# Patient Record
Sex: Male | Born: 1965 | Race: White | Hispanic: No | Marital: Single | Smoking: Never smoker
Health system: Southern US, Community
[De-identification: ages and names within clinical notes are randomized; demographics above are authoritative.]

## PROBLEM LIST (undated history)

## (undated) DIAGNOSIS — G4733 Obstructive sleep apnea (adult) (pediatric): Secondary | ICD-10-CM

## (undated) DIAGNOSIS — I219 Acute myocardial infarction, unspecified: Secondary | ICD-10-CM

## (undated) DIAGNOSIS — K8020 Calculus of gallbladder without cholecystitis without obstruction: Secondary | ICD-10-CM

## (undated) DIAGNOSIS — Z7982 Long term (current) use of aspirin: Secondary | ICD-10-CM

## (undated) DIAGNOSIS — E785 Hyperlipidemia, unspecified: Secondary | ICD-10-CM

## (undated) DIAGNOSIS — I2510 Atherosclerotic heart disease of native coronary artery without angina pectoris: Secondary | ICD-10-CM

## (undated) DIAGNOSIS — I509 Heart failure, unspecified: Secondary | ICD-10-CM

## (undated) DIAGNOSIS — E1169 Type 2 diabetes mellitus with other specified complication: Secondary | ICD-10-CM

## (undated) DIAGNOSIS — K219 Gastro-esophageal reflux disease without esophagitis: Secondary | ICD-10-CM

## (undated) DIAGNOSIS — R809 Proteinuria, unspecified: Secondary | ICD-10-CM

## (undated) DIAGNOSIS — I10 Essential (primary) hypertension: Secondary | ICD-10-CM

## (undated) DIAGNOSIS — N400 Enlarged prostate without lower urinary tract symptoms: Secondary | ICD-10-CM

## (undated) DIAGNOSIS — M1990 Unspecified osteoarthritis, unspecified site: Secondary | ICD-10-CM

## (undated) DIAGNOSIS — C801 Malignant (primary) neoplasm, unspecified: Secondary | ICD-10-CM

## (undated) DIAGNOSIS — G4730 Sleep apnea, unspecified: Secondary | ICD-10-CM

## (undated) DIAGNOSIS — E119 Type 2 diabetes mellitus without complications: Secondary | ICD-10-CM

## (undated) DIAGNOSIS — G5793 Unspecified mononeuropathy of bilateral lower limbs: Secondary | ICD-10-CM

## (undated) DIAGNOSIS — G709 Myoneural disorder, unspecified: Secondary | ICD-10-CM

## (undated) DIAGNOSIS — E871 Hypo-osmolality and hyponatremia: Secondary | ICD-10-CM

## (undated) HISTORY — PX: TRANSURETHRAL MICROWAVE THERAPY: SUR1394

## (undated) HISTORY — PX: APPENDECTOMY: SHX54

## (undated) HISTORY — DX: Heart failure, unspecified: I50.9

---
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
## 2005-05-21 DIAGNOSIS — C679 Malignant neoplasm of bladder, unspecified: Secondary | ICD-10-CM

## 2005-05-21 HISTORY — DX: Malignant neoplasm of bladder, unspecified: C67.9

## 2006-01-22 ENCOUNTER — Other Ambulatory Visit: Payer: Self-pay

## 2006-01-28 ENCOUNTER — Ambulatory Visit: Payer: Self-pay | Admitting: Urology

## 2010-09-06 ENCOUNTER — Encounter: Payer: Self-pay | Admitting: Orthopedic Surgery

## 2010-09-19 ENCOUNTER — Encounter: Payer: Self-pay | Admitting: Orthopedic Surgery

## 2016-12-24 ENCOUNTER — Other Ambulatory Visit: Payer: Self-pay | Admitting: Internal Medicine

## 2016-12-24 DIAGNOSIS — I208 Other forms of angina pectoris: Secondary | ICD-10-CM

## 2016-12-24 DIAGNOSIS — I10 Essential (primary) hypertension: Secondary | ICD-10-CM | POA: Diagnosis present

## 2016-12-27 ENCOUNTER — Inpatient Hospital Stay
Admission: EM | Admit: 2016-12-27 | Discharge: 2016-12-29 | DRG: 247 | Disposition: A | Discharge status: Home / Self Care | Payer: BC Managed Care – PPO | Attending: Internal Medicine | Admitting: Internal Medicine

## 2016-12-27 ENCOUNTER — Emergency Department: Payer: BC Managed Care – PPO

## 2016-12-27 ENCOUNTER — Encounter: Payer: Self-pay | Admitting: Emergency Medicine

## 2016-12-27 DIAGNOSIS — Z6841 Body Mass Index (BMI) 40.0 and over, adult: Secondary | ICD-10-CM

## 2016-12-27 DIAGNOSIS — I214 Non-ST elevation (NSTEMI) myocardial infarction: Principal | ICD-10-CM

## 2016-12-27 DIAGNOSIS — E119 Type 2 diabetes mellitus without complications: Secondary | ICD-10-CM | POA: Diagnosis present

## 2016-12-27 DIAGNOSIS — E785 Hyperlipidemia, unspecified: Secondary | ICD-10-CM | POA: Diagnosis present

## 2016-12-27 DIAGNOSIS — E669 Obesity, unspecified: Secondary | ICD-10-CM | POA: Diagnosis present

## 2016-12-27 DIAGNOSIS — Z880 Allergy status to penicillin: Secondary | ICD-10-CM | POA: Diagnosis not present

## 2016-12-27 DIAGNOSIS — Z8551 Personal history of malignant neoplasm of bladder: Secondary | ICD-10-CM | POA: Diagnosis not present

## 2016-12-27 DIAGNOSIS — R809 Proteinuria, unspecified: Secondary | ICD-10-CM | POA: Diagnosis present

## 2016-12-27 HISTORY — DX: Proteinuria, unspecified: R80.9

## 2016-12-27 HISTORY — DX: Hyperlipidemia, unspecified: E78.5

## 2016-12-27 HISTORY — DX: Malignant (primary) neoplasm, unspecified: C80.1

## 2016-12-27 HISTORY — DX: Non-ST elevation (NSTEMI) myocardial infarction: I21.4

## 2016-12-27 HISTORY — DX: Type 2 diabetes mellitus without complications: E11.9

## 2016-12-27 LAB — PROTIME-INR
INR: 0.97
Prothrombin Time: 12.9 seconds (ref 11.4–15.2)

## 2016-12-27 LAB — COMPREHENSIVE METABOLIC PANEL
ALK PHOS: 116 U/L (ref 38–126)
ALT: 45 U/L (ref 17–63)
AST: 42 U/L — AB (ref 15–41)
Albumin: 3.7 g/dL (ref 3.5–5.0)
Anion gap: 9 (ref 5–15)
BUN: 18 mg/dL (ref 6–20)
CHLORIDE: 102 mmol/L (ref 101–111)
CO2: 23 mmol/L (ref 22–32)
CREATININE: 1.08 mg/dL (ref 0.61–1.24)
Calcium: 9 mg/dL (ref 8.9–10.3)
GFR calc Af Amer: >60 mL/min (ref >60)
Glucose, Bld: 268 mg/dL — ABNORMAL HIGH (ref 65–99)
Potassium: 4.1 mmol/L (ref 3.5–5.1)
SODIUM: 134 mmol/L — AB (ref 135–145)
Total Bilirubin: 0.5 mg/dL (ref 0.3–1.2)
Total Protein: 6.4 g/dL — ABNORMAL LOW (ref 6.5–8.1)

## 2016-12-27 LAB — GLUCOSE, CAPILLARY
GLUCOSE-CAPILLARY: 112 mg/dL — AB (ref 65–99)
Glucose-Capillary: 220 mg/dL — ABNORMAL HIGH (ref 65–99)
Glucose-Capillary: 256 mg/dL — ABNORMAL HIGH (ref 65–99)

## 2016-12-27 LAB — TROPONIN I
TROPONIN I: 0.21 ng/mL — AB (ref <0.03)
Troponin I: 0.21 ng/mL (ref <0.03)
Troponin I: 0.13 ng/mL (ref <0.03)
Troponin I: 0.21 ng/mL (ref <0.03)

## 2016-12-27 LAB — CBC WITH DIFFERENTIAL/PLATELET
BASOS ABS: 0.1 10*3/uL (ref 0–0.1)
Basophils Relative: 1 %
EOS PCT: 2 %
Eosinophils Absolute: 0.1 10*3/uL (ref 0–0.7)
HCT: 44.2 % (ref 40.0–52.0)
HEMOGLOBIN: 15.3 g/dL (ref 13.0–18.0)
LYMPHS PCT: 19 %
Lymphs Abs: 1.2 10*3/uL (ref 1.0–3.6)
MCH: 29.8 pg (ref 26.0–34.0)
MCHC: 34.7 g/dL (ref 32.0–36.0)
MCV: 86.1 fL (ref 80.0–100.0)
Monocytes Absolute: 0.3 10*3/uL (ref 0.2–1.0)
Monocytes Relative: 5 %
NEUTROS PCT: 73 %
Neutro Abs: 4.5 10*3/uL (ref 1.4–6.5)
PLATELETS: 189 10*3/uL (ref 150–440)
RBC: 5.14 MIL/uL (ref 4.40–5.90)
RDW: 15.4 % — ABNORMAL HIGH (ref 11.5–14.5)
WBC: 6.2 10*3/uL (ref 3.8–10.6)

## 2016-12-27 LAB — LIPID PANEL
Cholesterol: 140 mg/dL (ref 0–200)
HDL: 26 mg/dL — AB (ref >40)
LDL CALC: UNDETERMINED mg/dL (ref 0–99)
Total CHOL/HDL Ratio: 5.4 RATIO
Triglycerides: 527 mg/dL — ABNORMAL HIGH (ref <150)
VLDL: UNDETERMINED mg/dL (ref 0–40)

## 2016-12-27 LAB — BRAIN NATRIURETIC PEPTIDE: B NATRIURETIC PEPTIDE 5: 15 pg/mL (ref 0.0–100.0)

## 2016-12-27 LAB — HEPARIN LEVEL (UNFRACTIONATED): Heparin Unfractionated: <0.1 IU/mL — ABNORMAL LOW (ref 0.30–0.70)

## 2016-12-27 LAB — APTT: APTT: 24 s (ref 24–36)

## 2016-12-27 LAB — HEMOGLOBIN A1C
HEMOGLOBIN A1C: 8.7 % — AB (ref 4.8–5.6)
MEAN PLASMA GLUCOSE: 202.99 mg/dL

## 2016-12-27 MED ORDER — DOCUSATE SODIUM 100 MG PO CAPS: 100.0000 mg | ORAL_CAPSULE | Freq: Two times a day (BID) | ORAL | Status: DC | PRN

## 2016-12-27 MED ORDER — SODIUM CHLORIDE 0.9 % WEIGHT BASED INFUSION: 1.0000 mL/kg/h | INTRAVENOUS | Status: DC

## 2016-12-27 MED ORDER — SODIUM CHLORIDE 0.9 % IV SOLN: 250.0000 mL | INTRAVENOUS | Status: DC | PRN

## 2016-12-27 MED ORDER — ASPIRIN 81 MG PO CHEW
81.0000 mg | CHEWABLE_TABLET | ORAL | Status: AC
  Administered 2016-12-28: 81 mg via ORAL
  Filled 2016-12-27: qty 1

## 2016-12-27 MED ORDER — HEPARIN BOLUS VIA INFUSION
3150.0000 [IU] | Freq: Once | INTRAVENOUS | Status: AC
  Administered 2016-12-27: 3150 [IU] via INTRAVENOUS
  Filled 2016-12-27: qty 3150

## 2016-12-27 MED ORDER — GABAPENTIN 400 MG PO CAPS
400.0000 mg | ORAL_CAPSULE | Freq: Three times a day (TID) | ORAL | Status: DC
  Administered 2016-12-27 – 2016-12-29 (×6): 400 mg via ORAL
  Filled 2016-12-27 (×6): qty 1

## 2016-12-27 MED ORDER — ALUM & MAG HYDROXIDE-SIMETH 200-200-20 MG/5ML PO SUSP
30.0000 mL | Freq: Four times a day (QID) | ORAL | Status: DC | PRN
  Administered 2016-12-27 – 2016-12-28 (×3): 30 mL via ORAL
  Filled 2016-12-27 (×2): qty 30

## 2016-12-27 MED ORDER — ASPIRIN 81 MG PO CHEW
324.0000 mg | CHEWABLE_TABLET | Freq: Once | ORAL | Status: AC
  Administered 2016-12-27: 324 mg via ORAL
  Filled 2016-12-27: qty 4

## 2016-12-27 MED ORDER — SODIUM CHLORIDE 0.9% FLUSH: 3.0000 mL | INTRAVENOUS | Status: DC | PRN

## 2016-12-27 MED ORDER — IOPAMIDOL (ISOVUE-370) INJECTION 76%
75.0000 mL | Freq: Once | INTRAVENOUS | Status: AC | PRN
  Administered 2016-12-27: 100 mL via INTRAVENOUS

## 2016-12-27 MED ORDER — NITROGLYCERIN 0.4 MG SL SUBL: 0.4000 mg | SUBLINGUAL_TABLET | SUBLINGUAL | Status: DC | PRN

## 2016-12-27 MED ORDER — LOSARTAN POTASSIUM 50 MG PO TABS
100.0000 mg | ORAL_TABLET | Freq: Every day | ORAL | Status: DC
  Administered 2016-12-27 – 2016-12-29 (×2): 100 mg via ORAL
  Filled 2016-12-27 (×2): qty 2

## 2016-12-27 MED ORDER — ATORVASTATIN CALCIUM 20 MG PO TABS
20.0000 mg | ORAL_TABLET | Freq: Every day | ORAL | Status: DC
  Administered 2016-12-27: 20 mg via ORAL
  Filled 2016-12-27: qty 1

## 2016-12-27 MED ORDER — SODIUM CHLORIDE 0.9 % WEIGHT BASED INFUSION
3.0000 mL/kg/h | INTRAVENOUS | Status: AC
  Administered 2016-12-28: 3 mL/kg/h via INTRAVENOUS

## 2016-12-27 MED ORDER — INSULIN ASPART 100 UNIT/ML ~~LOC~~ SOLN
0.0000 [IU] | Freq: Three times a day (TID) | SUBCUTANEOUS | Status: DC
  Administered 2016-12-27: 3 [IU] via SUBCUTANEOUS
  Filled 2016-12-27: qty 1

## 2016-12-27 MED ORDER — HEPARIN (PORCINE) IN NACL 100-0.45 UNIT/ML-% IJ SOLN
2100.0000 [IU]/h | INTRAMUSCULAR | Status: DC
  Administered 2016-12-27: 1350 [IU]/h via INTRAVENOUS
  Administered 2016-12-27: 1700 [IU]/h via INTRAVENOUS
  Filled 2016-12-27: qty 250

## 2016-12-27 MED ORDER — ATORVASTATIN CALCIUM 20 MG PO TABS
40.0000 mg | ORAL_TABLET | Freq: Every day | ORAL | Status: DC
  Administered 2016-12-29: 40 mg via ORAL
  Filled 2016-12-27: qty 2

## 2016-12-27 MED ORDER — SODIUM CHLORIDE 0.9 % IV BOLUS (SEPSIS)
500.0000 mL | Freq: Once | INTRAVENOUS | Status: AC
Start: 2016-12-27 — End: 2016-12-27
  Administered 2016-12-27: 500 mL via INTRAVENOUS

## 2016-12-27 MED ORDER — SODIUM CHLORIDE 0.9% FLUSH
3.0000 mL | Freq: Two times a day (BID) | INTRAVENOUS | Status: DC
  Administered 2016-12-27 (×2): 3 mL via INTRAVENOUS

## 2016-12-27 MED ORDER — HEPARIN BOLUS VIA INFUSION
4000.0000 [IU] | Freq: Once | INTRAVENOUS | Status: AC
  Administered 2016-12-27: 4000 [IU] via INTRAVENOUS
  Filled 2016-12-27: qty 4000

## 2016-12-27 NOTE — H&P (Signed)
[place]  CARDIOLOGY CONSULT NOTE  Patient ID: Justin Hammond MRN: [id number] DOB/AGE: [birth year] 53 y.o.  Admit date: 12/27/2016 Referring Physician Dr. Anselm Jungling Primary Physician Dr. Hall Busing Primary Cardiologist Dr. Nehemiah Massed Reason for Consultation nstemi  HPI: Patient is a 51 year old male with no prior cardiac history with history of diabetes, hyperlipidemia, bladder carcinoma, obesity who has noticed progressive tightness in his chest with shortness of breath with exertion. He has dyspnea with minimal activity including tying his shoes. He presented to an outpatient cardiac clinic where he was evaluated and scheduled for nuclear stress test. He developed worsening chest pain and presented to the emergency room. Initial evaluation revealed a normal electrocardiogram. His initial serum troponin was 0.21. Subsequent values were similar at 0.13 and 0.21. His risk factors for coronary disease include diabetes, obesity and hyperlipidemia. He is currently pain-free and on IV heparin. Review of Systems  Constitutional: Negative.   HENT: Negative.   Eyes: Negative.   Respiratory: Positive for shortness of breath.   Cardiovascular: Positive for chest pain.  Gastrointestinal: Negative.   Genitourinary: Negative.   Musculoskeletal: Negative.   Skin: Negative.   Neurological: Negative.   Endo/Heme/Allergies: Negative.   Psychiatric/Behavioral: Negative.     Past Medical History:  Diagnosis Date  . Cancer [facility])    bladder cancer  . Diabetes mellitus without complication ([place])   . Hyperlipidemia   . Proteinuria     Family History  Problem Relation Age of Onset  . Breast cancer Mother   . CAD Father     Social History   Social History  . Marital status: Single    Spouse name: N/A  . Number of children: N/A  . Years of education: N/A   Occupational History  . Not on file.   Social History Main Topics  . Smoking status:  Never Smoker  . Smokeless tobacco: Never Used  . Alcohol use No  . Drug use: Unknown  . Sexual activity: Not on file   Other Topics Concern  . Not on file   Social History Narrative  . No narrative on file    Past Surgical History:  Procedure Laterality Date  . APPENDECTOMY       Prescriptions Prior to Admission  Medication Sig Dispense Refill Last Dose  . atorvastatin (LIPITOR) 20 MG tablet Take 1 tablet by mouth daily.   12/27/2016 at 0500  . gabapentin (NEURONTIN) 400 MG capsule Take 1 capsule by mouth 3 (three) times daily.   12/27/2016 at 0500  . glimepiride (AMARYL) 4 MG tablet Take 1 tablet by mouth daily.   12/27/2016 at 0500  . losartan (COZAAR) 100 MG tablet Take 1 tablet by mouth daily.   12/27/2016 at 0500  . metFORMIN (GLUCOPHAGE) 500 MG tablet Take 500 mg by mouth 2 (two) times daily.   12/27/2016 at 0500    Physical Exam: Blood pressure 132/80, pulse 70, temperature 98.9 F (37.2 C), temperature source Oral, resp. rate 17, height 5\' 8"  (1.727 m), weight (!) 151.4 kg (333 lb 11.2 oz), SpO2 97 %.   Wt Readings from Last 1 Encounters:  12/27/16 (!) 151.4 kg (333 lb 11.2 oz)     General appearance: alert and cooperative Resp: clear to auscultation bilaterally Chest wall: no tenderness Cardio: regular rate and rhythm GI: soft, non-tender; bowel sounds normal; no masses,  no organomegaly Extremities: extremities normal, atraumatic, no cyanosis or edema Neurologic: Grossly normal  Labs:   Lab Results  Component Value Date   WBC 6.2 12/27/2016   HGB 15.3 12/27/2016   HCT 44.2 12/27/2016   MCV 86.1 12/27/2016   PLT 189 12/27/2016    Recent Labs Lab 12/27/16 0707  NA 134*  K 4.1  CL 102  CO2 23  BUN 18  CREATININE 1.08  CALCIUM 9.0  PROT 6.4*  BILITOT 0.5  ALKPHOS 116  ALT 45  AST 42*  GLUCOSE 268*   Lab Results  Component Value Date   TROPONINI 0.21 ([place]) 12/27/2016      Radiology: Chest x-ray revealed no acute cardiopulmonary disease. Chest CT for  pulmonary embolus was normal showing no pulmonary embolus. Coronary calcifications were noted. EKG: Sinus rhythm with no ischemia  ASSESSMENT AND PLAN:  51 year old male with history of exertional chest pain with shortness of breath. He has developed progressive symptoms. Risk factors include diabetes, obesity. He has a mild serum troponin elevation concerning for possible non-ST elevation myocardial infarction. Renal function is normal. He had a chest CT today revealing no pulmonary embolus. He is on metformin for diabetes and took it this morning. We'll hold his metformin following his renal function and plan to proceed with left heart catheter in the morning on IV heparin to evaluate for evidence of significant coronary disease to guide further therapy. Signed: Teodoro Spray MD, [facility] 12/27/2016, 4:54 PM

## 2016-12-27 NOTE — ED Notes (Signed)
Patient transported to X-ray 

## 2016-12-27 NOTE — Progress Notes (Signed)
ANTICOAGULATION CONSULT NOTE - Initial Consult  Pharmacy Consult for Heparin Drip Indication: chest pain/ACS  Allergies  Allergen Reactions  . Penicillins Itching    Has patient had a PCN reaction causing immediate rash, facial/tongue/throat swelling, SOB or lightheadedness with hypotension: No Has patient had a PCN reaction causing severe rash involving mucus membranes or skin necrosis: No Has patient had a PCN reaction that required hospitalization: No Has patient had a PCN reaction occurring within the last 10 years: No If all of the above answers are "NO", then may proceed with Cephalosporin use.     Patient Measurements: Height: 5\' 8"  (172.7 cm) Weight: (!) 336 lb (152.4 kg) IBW/kg (Calculated) : 68.4 Heparin Dosing Weight: 105.6 kg  Vital Signs: Temp: 98.9 F (37.2 C) (08/09 1052) Temp Source: Oral (08/09 1052) BP: 132/80 (08/09 1052) Pulse Rate: 70 (08/09 1052)  Labs:  Recent Labs  12/27/16 0707 12/27/16 0922 12/27/16 0953  HGB 15.3  --   --   HCT 44.2  --   --   PLT 189  --   --   APTT  --  24  --   LABPROT  --  12.9  --   INR  --  0.97  --   CREATININE 1.08  --   --   TROPONINI 0.21*  --  0.13*    Estimated Creatinine Clearance: 114.1 mL/min (by C-G formula based on SCr of 1.08 mg/dL).   Medical History: Past Medical History:  Diagnosis Date  . Cancer [facility])    bladder cancer  . Diabetes mellitus without complication ([place])   . Hyperlipidemia   . Proteinuria     Medications:  Scheduled:  . atorvastatin  20 mg Oral Daily  . gabapentin  400 mg Oral TID  . insulin aspart  0-9 Units Subcutaneous TID WC  . losartan  100 mg Oral Daily   Infusions:  . heparin 1,350 Units/hr (12/27/16 0932)    Assessment: Heparin drip ordered for NSTEMI for this 51 yo male.   Goal of Therapy:  Heparin level 0.3-0.7 units/ml Monitor platelets by anticoagulation protocol: Yes   Plan:  Give 4000 units bolus x 1 Start heparin infusion at 1350 units/hr Check  anti-Xa level in 6 hours and daily while on heparin Continue to monitor H&H and platelets    HL ordered for today at 15:30.    Olivia Canter, [facility] Clinical Pharmacist 12/27/2016,11:19 AM

## 2016-12-27 NOTE — H&P (Signed)
[place] [facility] [place] NAME: Justin Hammond    MR#:  [id number]  DATE OF BIRTH:  July 18, 1965  DATE OF ADMISSION:  12/27/2016  PRIMARY CARE PHYSICIAN: Albina Billet, MD   REQUESTING/REFERRING PHYSICIAN: Clearnce Hasten  CHIEF COMPLAINT:   Chief Complaint  Patient presents with  . Shortness of Breath    HISTORY OF PRESENT ILLNESS: Justin Hammond  is a 51 y.o. male with a known history of Bladder cancer, diabetes, hyperlipidemia, proteinuria, obesity. For last few days he started having tightness in his chest and shortness of breath associated with exertion and minimal activities like walking a few steps or tying his shoes and concerned with this he went to his primary care's office they referred him to cardiologist. Patient saw Dr. Nehemiah Massed in office 2 days ago, EKG was fine so he was scheduled to have a nuclear stress test as outpatient next week. Patient continued to have pain and tightness with minimal exertion to the point today morning that he decided not to go to his work and instructed to call his primary care doctor's office. So he was advised from doctor's office to go to emergency room, and he came here. In ER his troponin is noted 0.2 and EKG was normal but because of his typical complaint he started on IV heparin drip for non-ST elevation MI and given to hospitalist team for further management.  PAST MEDICAL HISTORY:   Past Medical History:  Diagnosis Date  . Cancer [facility])    bladder cancer  . Diabetes mellitus without complication ([place])   . Hyperlipidemia   . Proteinuria     PAST SURGICAL HISTORY: Past Surgical History:  Procedure Laterality Date  . APPENDECTOMY      SOCIAL HISTORY:  Social History  Substance Use Topics  . Smoking status: Never Smoker  . Smokeless tobacco: Never Used  . Alcohol use No    FAMILY HISTORY:  Family History  Problem Relation Age of Onset  . Breast cancer Mother   . CAD Father     DRUG  ALLERGIES:  Allergies  Allergen Reactions  . Penicillins Itching    Has patient had a PCN reaction causing immediate rash, facial/tongue/throat swelling, SOB or lightheadedness with hypotension: No Has patient had a PCN reaction causing severe rash involving mucus membranes or skin necrosis: No Has patient had a PCN reaction that required hospitalization: No Has patient had a PCN reaction occurring within the last 10 years: No If all of the above answers are "NO", then may proceed with Cephalosporin use.     REVIEW OF SYSTEMS:   CONSTITUTIONAL: No fever, fatigue or weakness.  EYES: No blurred or double vision.  EARS, NOSE, AND THROAT: No tinnitus or ear pain.  RESPIRATORY: No cough, shortness of breath, wheezing or hemoptysis.  CARDIOVASCULAR: positive for chest pain,no orthopnea, edema.  GASTROINTESTINAL: No nausea, vomiting, diarrhea or abdominal pain.  GENITOURINARY: No dysuria, hematuria.  ENDOCRINE: No polyuria, nocturia,  HEMATOLOGY: No anemia, easy bruising or bleeding SKIN: No rash or lesion. MUSCULOSKELETAL: No joint pain or arthritis.   NEUROLOGIC: No tingling, numbness, weakness.  PSYCHIATRY: No anxiety or depression.   MEDICATIONS AT HOME:  Prior to Admission medications   Medication Sig Start Date End Date Taking? Authorizing Provider  atorvastatin (LIPITOR) 20 MG tablet Take 1 tablet by mouth daily. 09/12/15  Yes [provider]  gabapentin (NEURONTIN) 400 MG capsule Take 1 capsule by mouth 3 (three) times daily. 10/21/16  Yes [provider]  glimepiride (AMARYL) 4 MG tablet Take 1 tablet by mouth daily. 10/12/15  Yes [provider]  losartan (COZAAR) 100 MG tablet Take 1 tablet by mouth daily. 12/05/16  Yes [provider]  metFORMIN (GLUCOPHAGE) 500 MG tablet Take 500 mg by mouth 2 (two) times daily. 08/19/15  Yes [provider]      PHYSICAL EXAMINATION:   VITAL SIGNS: Blood pressure 125/70, pulse 74, resp. rate 16,  height 5\' 8"  (1.727 m), weight (!) 152.4 kg (336 lb), SpO2 96 %.  GENERAL:  51 y.o.-year-old obese patient lying in the bed with no acute distress.  EYES: Pupils equal, round, reactive to light and accommodation. No scleral icterus. Extraocular muscles intact.  HEENT: Head atraumatic, normocephalic. Oropharynx and nasopharynx clear.  NECK:  Supple, no jugular venous distention. No thyroid enlargement, no tenderness.  LUNGS: Normal breath sounds bilaterally, no wheezing, rales,rhonchi or crepitation. No use of accessory muscles of respiration.  CARDIOVASCULAR: S1, S2 normal. No murmurs, rubs, or gallops.  ABDOMEN: Soft, nontender, nondistended. Bowel sounds present. No organomegaly or mass.  EXTREMITIES: No pedal edema, cyanosis, or clubbing.  NEUROLOGIC: Cranial nerves II through XII are intact. Muscle strength 5/5 in all extremities. Sensation intact. Gait not checked.  PSYCHIATRIC: The patient is alert and oriented x 3.  SKIN: No obvious rash, lesion, or ulcer.   LABORATORY PANEL:   CBC  Recent Labs Lab 12/27/16 0707  WBC 6.2  HGB 15.3  HCT 44.2  PLT 189  MCV 86.1  MCH 29.8  MCHC 34.7  RDW 15.4*  LYMPHSABS 1.2  MONOABS 0.3  EOSABS 0.1  BASOSABS 0.1   ------------------------------------------------------------------------------------------------------------------  Chemistries   Recent Labs Lab 12/27/16 0707  NA 134*  K 4.1  CL 102  CO2 23  GLUCOSE 268*  BUN 18  CREATININE 1.08  CALCIUM 9.0  AST 42*  ALT 45  ALKPHOS 116  BILITOT 0.5   ------------------------------------------------------------------------------------------------------------------ estimated creatinine clearance is 114.1 mL/min (by C-G formula based on SCr of 1.08 mg/dL). ------------------------------------------------------------------------------------------------------------------ No results for input(s): TSH, T4TOTAL, T3FREE, THYROIDAB in the last 72 hours.  Invalid input(s):  FREET3   Coagulation profile No results for input(s): INR, PROTIME in the last 168 hours. ------------------------------------------------------------------------------------------------------------------- No results for input(s): DDIMER in the last 72 hours. -------------------------------------------------------------------------------------------------------------------  Cardiac Enzymes  Recent Labs Lab 12/27/16 0707  TROPONINI 0.21*   ------------------------------------------------------------------------------------------------------------------ Invalid input(s): POCBNP  ---------------------------------------------------------------------------------------------------------------  Urinalysis No results found for: COLORURINE, APPEARANCEUR, LABSPEC, PHURINE, GLUCOSEU, HGBUR, BILIRUBINUR, KETONESUR, PROTEINUR, UROBILINOGEN, NITRITE, LEUKOCYTESUR   RADIOLOGY: Dg Chest 2 View  Result Date: 12/27/2016 CLINICAL DATA:  51 year old male with history of acute onset of shortness of breath. Clinical concern for pneumonia. EXAM: CHEST  2 VIEW COMPARISON:  Chest x-ray in 01/22/2006. FINDINGS: Lung volumes are normal. No consolidative airspace disease. No pleural effusions. No pneumothorax. No pulmonary nodule or mass noted. Pulmonary vasculature and the cardiomediastinal silhouette are within normal limits. IMPRESSION: No radiographic evidence of acute cardiopulmonary disease. Electronically Signed   By: Vinnie Langton M.D.   On: 12/27/2016 07:59   Ct Angio Chest Pe W And/or Wo Contrast  Result Date: 12/27/2016 CLINICAL DATA:  Shortness of Breath EXAM: CT ANGIOGRAPHY CHEST WITH CONTRAST TECHNIQUE: Multidetector CT imaging of the chest was performed using the standard protocol during bolus administration of intravenous contrast. Multiplanar CT image reconstructions and MIPs were obtained to evaluate the vascular anatomy. CONTRAST:  100 mL Isovue 370 nonionic COMPARISON:  Chest radiograph  December 27, 2016 FINDINGS: Cardiovascular: There is no  demonstrable pulmonary embolus. There is no thoracic aortic aneurysm or dissection. Visualized great vessels appear unremarkable. There are foci of coronary artery calcification present. No appreciable aortic calcification. Pericardium is not appreciably thickened. There is prominence of the main pulmonary outflow tract, measuring 3.5 cm in diameter. This is a finding suggestive of pulmonary arterial hypertension. Mediastinum/Nodes: Visualized thyroid appears unremarkable. There is no appreciable thoracic adenopathy. There is a small hiatal hernia. Lungs/Pleura: There is no edema or consolidation. No pleural effusion or pleural thickening. Upper Abdomen: Visualized upper abdominal structures appear unremarkable. Musculoskeletal: There is degenerative change in the thoracic spine. There is mild degenerative change in each shoulder. No blastic or lytic bone lesions. Review of the MIP images confirms the above findings. IMPRESSION: 1.  No demonstrable pulmonary embolus. 2. Prominence of the main pulmonary outflow tract, a finding suggesting pulmonary arterial hypertension. 3.  No edema or consolidation. 4.  No evident adenopathy. 5.  There are foci of coronary artery calcification. Electronically Signed   By: Lowella Grip III M.D.   On: 12/27/2016 08:28    EKG: Orders placed or performed during the hospital encounter of 12/27/16  . ED EKG  . ED EKG  . EKG 12-Lead  . EKG 12-Lead    IMPRESSION AND PLAN:  * Non-ST elevation MI    IV heparin drip is ordered by ER physician, I agree with that.   Check lipid panel and hemoglobin A1c, monitor on telemetry and follow serial troponin.   Cardiology consult to decide further plan, most likely may need catheterization  * Hyperlipidemia   Continue statin and check lipid panel.  * Diabetes   We'll hold oral agents for now and keep on sliding scale coverage.  * Proteinuria   Continue losartan.  All  the records are reviewed and case discussed with ED provider. Management plans discussed with the patient, family and they are in agreement.  CODE STATUS: Full code. Code Status History    This patient does not have a recorded code status. Please follow your organizational policy for patients in this situation.     I called cardiologist office about the consult  TOTAL TIME TAKING CARE OF THIS PATIENT: 50 minutes.    Vaughan Basta M.D on 12/27/2016   Between 7am to 6pm - Pager - [phone]  After 6pm go to [email or web address] - password [email or web address]  Office  [phone]  CC: Primary care physician; Albina Billet, MD   Note: This dictation was prepared with Dragon dictation along with smaller phrase technology. Any transcriptional errors that result from this process are unintentional.

## 2016-12-27 NOTE — ED Notes (Signed)
Patient transported to CT 

## 2016-12-27 NOTE — ED Notes (Signed)
Date and time results received: 12/27/16 0742 (use smartphrase ".now" to insert current time)  Test: troponin Critical Value: 0.21  Name of Provider Notified: [email or web address]

## 2016-12-27 NOTE — ED Notes (Addendum)
Pt denies pain at this moment. Remains alert and oriented. Respirations remain unlabored. Skin warm and dry

## 2016-12-27 NOTE — Progress Notes (Signed)
Chaplain went to visit patient and family member, while on rounds. Provided ministry of presence, as patient shared additional need for visit.     12/27/16 1850  Clinical Encounter Type  Visited With Patient;Patient and family together  Visit Type Initial;Spiritual support  Referral From Chaplain  Consult/Referral To Chaplain  Spiritual Encounters  Spiritual Needs Other (Comment)

## 2016-12-27 NOTE — ED Triage Notes (Signed)
Patient ambulatory to triage with steady gait, without difficulty or distress noted; pt reports has been having SHOB, worsening this morning; denies pain, denies recent illness or cough; denies any hx of same; pt taken to room 10 via w/c by EDT, Ashlyn to be placed on card monitor for further eval and EKG

## 2016-12-27 NOTE — ED Provider Notes (Signed)
[facility] Emergency Department Provider Note  ____________________________________________   First MD Initiated Contact with Patient 12/27/16 0700     (approximate)  I have reviewed the triage vital signs and the nursing notes.   HISTORY  Chief Complaint Shortness of Breath   HPI Justin Hammond is a 51 y.o. male with a history of diabetes mellitus without complication who is presenting to the emergency department today with shortness of breath over the past week. He says that he becomes short of breath with exertion or movement. Says that the shortness of breath resolves with rest and is not worsened with lying back. He says that he wears a CPAP at night and has had no shortness of breath overnight. Says that he has a mild cough which is nonproductive which she describes as chronic. The patient denies any smoking. Says that he also intermittently feels a tightness in his chest with exertion and denies any tightness in his chest this time but feels a little tightness in his neck and throat. Unable to give an out of 10 rating.   Past Medical History:  Diagnosis Date  . Diabetes mellitus without complication ([place])     There are no active problems to display for this patient.   Past Surgical History:  Procedure Laterality Date  . APPENDECTOMY      Prior to Admission medications   Not on File    Allergies Penicillins  No family history on file.  Social History Social History  Substance Use Topics  . Smoking status: Current Every Day Smoker  . Smokeless tobacco: Never Used  . Alcohol use No    Review of Systems  Constitutional: No fever/chills Eyes: No visual changes. ENT: No sore throat. Cardiovascular: as above Respiratory: as above Gastrointestinal: No abdominal pain.  No nausea, no vomiting.  No diarrhea.  No constipation. Genitourinary: Negative for dysuria. Musculoskeletal: Negative for back pain. Skin: Negative for  rash. Neurological: Negative for headaches, focal weakness or numbness.   ____________________________________________   PHYSICAL EXAM:  VITAL SIGNS: ED Triage Vitals  Enc Vitals Group     BP 12/27/16 0704 135/81     Pulse Rate 12/27/16 0704 70     Resp 12/27/16 0704 17     Temp --      Temp src --      SpO2 12/27/16 0704 93 %     Weight 12/27/16 0653 (!) 336 lb (152.4 kg)     Height 12/27/16 0653 5\' 8"  (1.727 m)     Head Circumference --      Peak Flow --      Pain Score --      Pain Loc --      Pain Edu? --      Excl. in [place]? --     Constitutional: Alert and oriented. Well appearing and in no acute distress. Eyes: Conjunctivae are normal.  Head: Atraumatic. Nose: No congestion/rhinnorhea. Mouth/Throat: Mucous membranes are moist.  Neck: No stridor.   Cardiovascular: Normal rate, regular rhythm. Grossly normal heart sounds.   Respiratory: Normal respiratory effort.  No retractions. Lungs CTAB. Gastrointestinal: Soft and nontender. No distention.  Musculoskeletal: No lower extremity tenderness nor edema.  No joint effusions. Neurologic:  Normal speech and language. No gross focal neurologic deficits are appreciated. Skin:  Skin is warm, dry and intact. No rash noted. Psychiatric: Mood and affect are normal. Speech and behavior are normal.  ____________________________________________   LABS (all labs ordered are listed, but only  abnormal results are displayed)  Labs Reviewed  CBC WITH DIFFERENTIAL/PLATELET - Abnormal; Notable for the following:       Result Value   RDW 15.4 (*)    All other components within normal limits  COMPREHENSIVE METABOLIC PANEL  TROPONIN I  BRAIN NATRIURETIC PEPTIDE   ____________________________________________  EKG  ED ECG REPORT I, Doran Stabler, the attending physician, personally viewed and interpreted this ECG.   Date: 12/27/2016  EKG Time: 7:04 AM  Rate: 74  Rhythm: normal sinus rhythm  Axis: Normal   Intervals:none  ST&T Change: No ST segment elevation or depression. No abnormal T-wave inversion.  ____________________________________________  RADIOLOGY  CT negative for CHF and PE. However, there is evidence of pulmonary hypertension. ____________________________________________   PROCEDURES  Procedure(s) performed:   Procedures  Critical Care performed:  CRITICAL CARE Performed by: Doran Stabler   Total critical care time: 35 minutes  Critical care time was exclusive of separately billable procedures and treating other patients.  Critical care was necessary to treat or prevent imminent or life-threatening deterioration.  Critical care was time spent personally by me on the following activities: development of treatment plan with patient and/or surrogate as well as nursing, discussions with consultants, evaluation of patient's response to treatment, examination of patient, obtaining history from patient or surrogate, ordering and performing treatments and interventions, ordering and review of laboratory studies, ordering and review of radiographic studies, pulse oximetry and re-evaluation of patient's condition.   ____________________________________________   INITIAL IMPRESSION / ASSESSMENT AND PLAN / ED COURSE  Pertinent labs & imaging results that were available during my care of the patient were reviewed by me and considered in my medical decision making (see chart for details).  ----------------------------------------- 9:18 AM on 12/27/2016 -----------------------------------------  Patient with an elevated troponin at 0.21. Patient without any distress this time. He will be started on heparin as well as given aspirin to chew in the emergency department. He is aware of the need for further workup. Signed out to the hospitalist, Dr. Anselm Jungling.      ____________________________________________   FINAL CLINICAL IMPRESSION(S) / ED  DIAGNOSES  NSTEMI   NEW MEDICATIONS STARTED DURING THIS VISIT:  New Prescriptions   No medications on file     Note:  This document was prepared using Dragon voice recognition software and may include unintentional dictation errors.     Orbie Pyo, MD 12/27/16 [phone]

## 2016-12-27 NOTE — Progress Notes (Addendum)
ANTICOAGULATION CONSULT NOTE - Initial Consult  Pharmacy Consult for Heparin Drip Indication: chest pain/ACS  Allergies  Allergen Reactions  . Penicillins Itching    Has patient had a PCN reaction causing immediate rash, facial/tongue/throat swelling, SOB or lightheadedness with hypotension: No Has patient had a PCN reaction causing severe rash involving mucus membranes or skin necrosis: No Has patient had a PCN reaction that required hospitalization: No Has patient had a PCN reaction occurring within the last 10 years: No If all of the above answers are "NO", then may proceed with Cephalosporin use.     Patient Measurements: Height: 5\' 8"  (172.7 cm) Weight: (!) 333 lb 11.2 oz (151.4 kg) IBW/kg (Calculated) : 68.4 Heparin Dosing Weight: 105.6 kg  Vital Signs: Temp: 98.9 F (37.2 C) (08/09 1052) Temp Source: Oral (08/09 1052) BP: 132/80 (08/09 1052) Pulse Rate: 70 (08/09 1052)  Labs:  Recent Labs  12/27/16 0707 12/27/16 0922 12/27/16 0953 12/27/16 1526 12/27/16 1741  HGB 15.3  --   --   --   --   HCT 44.2  --   --   --   --   PLT 189  --   --   --   --   APTT  --  24  --   --   --   LABPROT  --  12.9  --   --   --   INR  --  0.97  --   --   --   HEPARINUNFRC  --   --   --  <0.10*  --   CREATININE 1.08  --   --   --   --   TROPONINI 0.21*  --  0.13* 0.21* 0.21*    Estimated Creatinine Clearance: 113.7 mL/min (by C-G formula based on SCr of 1.08 mg/dL).   Medical History: Past Medical History:  Diagnosis Date  . Cancer [facility])    bladder cancer  . Diabetes mellitus without complication ([place])   . Hyperlipidemia   . Proteinuria     Medications:  Scheduled:  . [START ON 12/28/2016] aspirin  81 mg Oral Pre-Cath  . [START ON 12/28/2016] atorvastatin  40 mg Oral Daily  . gabapentin  400 mg Oral TID  . insulin aspart  0-9 Units Subcutaneous TID WC  . losartan  100 mg Oral Daily  . sodium chloride flush  3 mL Intravenous Q12H   Infusions:  . sodium chloride     . [START ON 12/28/2016] sodium chloride     Followed by  . [START ON 12/28/2016] sodium chloride    . heparin 1,700 Units/hr (12/27/16 1817)    Assessment: Heparin drip ordered for NSTEMI for this 51 yo male.   Goal of Therapy:  Heparin level 0.3-0.7 units/ml Monitor platelets by anticoagulation protocol: Yes   Plan:  HL low <0.1. Will bolus 3150 units and increase rate to 1700 units/hr. Recheck level in 6 hours  Melissa D Maccia, Pharm.D, BCPS Clinical Pharmacist  12/27/2016,6:51 PM    8/10 0000 heparin level 0.12. 3200 unit bolus and increase rate to 2100 units/hr. Recheck in 6 hours.  Sim Boast, PharmD, BCPS  12/28/16 1:39 AM

## 2016-12-28 ENCOUNTER — Encounter
Admission: EM | Disposition: A | Discharge status: Home / Self Care | Payer: Self-pay | Source: Home / Self Care | Attending: Internal Medicine

## 2016-12-28 ENCOUNTER — Encounter: Payer: Self-pay | Admitting: *Deleted

## 2016-12-28 DIAGNOSIS — I2510 Atherosclerotic heart disease of native coronary artery without angina pectoris: Secondary | ICD-10-CM

## 2016-12-28 HISTORY — PX: LEFT HEART CATH AND CORONARY ANGIOGRAPHY: CATH118249

## 2016-12-28 HISTORY — PX: CORONARY STENT INTERVENTION: CATH118234

## 2016-12-28 HISTORY — DX: Atherosclerotic heart disease of native coronary artery without angina pectoris: I25.10

## 2016-12-28 LAB — CBC
HCT: 42.7 % (ref 40.0–52.0)
Hemoglobin: 14.8 g/dL (ref 13.0–18.0)
MCH: 29.8 pg (ref 26.0–34.0)
MCHC: 34.7 g/dL (ref 32.0–36.0)
MCV: 85.9 fL (ref 80.0–100.0)
Platelets: 190 10*3/uL (ref 150–440)
RBC: 4.98 MIL/uL (ref 4.40–5.90)
RDW: 15.5 % — AB (ref 11.5–14.5)
WBC: 7.4 10*3/uL (ref 3.8–10.6)

## 2016-12-28 LAB — HIV ANTIBODY (ROUTINE TESTING W REFLEX): HIV SCREEN 4TH GENERATION: NONREACTIVE

## 2016-12-28 LAB — BASIC METABOLIC PANEL
Anion gap: 9 (ref 5–15)
BUN: 15 mg/dL (ref 6–20)
CALCIUM: 8.9 mg/dL (ref 8.9–10.3)
CO2: 25 mmol/L (ref 22–32)
CREATININE: 0.88 mg/dL (ref 0.61–1.24)
Chloride: 100 mmol/L — ABNORMAL LOW (ref 101–111)
GFR calc Af Amer: >60 mL/min (ref >60)
GLUCOSE: 218 mg/dL — AB (ref 65–99)
Potassium: 3.8 mmol/L (ref 3.5–5.1)
Sodium: 134 mmol/L — ABNORMAL LOW (ref 135–145)

## 2016-12-28 LAB — GLUCOSE, CAPILLARY
Glucose-Capillary: 231 mg/dL — ABNORMAL HIGH (ref 65–99)
Glucose-Capillary: 237 mg/dL — ABNORMAL HIGH (ref 65–99)

## 2016-12-28 LAB — HEPARIN LEVEL (UNFRACTIONATED): HEPARIN UNFRACTIONATED: 0.12 [IU]/mL — AB (ref 0.30–0.70)

## 2016-12-28 LAB — POCT ACTIVATED CLOTTING TIME: Activated Clotting Time: 384 seconds

## 2016-12-28 SURGERY — CORONARY STENT INTERVENTION
Anesthesia: Moderate Sedation

## 2016-12-28 MED ORDER — ASPIRIN 81 MG PO CHEW
CHEWABLE_TABLET | ORAL | Status: DC | PRN
  Administered 2016-12-28: 324 mg via ORAL

## 2016-12-28 MED ORDER — BIVALIRUDIN TRIFLUOROACETATE 250 MG IV SOLR
INTRAVENOUS | Status: AC
  Filled 2016-12-28: qty 250

## 2016-12-28 MED ORDER — INSULIN ASPART 100 UNIT/ML ~~LOC~~ SOLN
5.0000 [IU] | Freq: Three times a day (TID) | SUBCUTANEOUS | Status: DC
  Administered 2016-12-28 – 2016-12-29 (×2): 5 [IU] via SUBCUTANEOUS
  Filled 2016-12-28 (×2): qty 1

## 2016-12-28 MED ORDER — NITROGLYCERIN 1 MG/10 ML FOR IR/CATH LAB
INTRA_ARTERIAL | Status: DC | PRN
  Administered 2016-12-28: 200 ug via INTRACORONARY

## 2016-12-28 MED ORDER — FENTANYL CITRATE (PF) 100 MCG/2ML IJ SOLN
INTRAMUSCULAR | Status: AC
  Filled 2016-12-28: qty 2

## 2016-12-28 MED ORDER — ASPIRIN 81 MG PO CHEW
CHEWABLE_TABLET | ORAL | Status: AC
  Filled 2016-12-28: qty 3

## 2016-12-28 MED ORDER — HEPARIN (PORCINE) IN NACL 2-0.9 UNIT/ML-% IJ SOLN
INTRAMUSCULAR | Status: AC
  Filled 2016-12-28: qty 1000

## 2016-12-28 MED ORDER — LIDOCAINE HCL (PF) 1 % IJ SOLN
INTRAMUSCULAR | Status: AC
  Filled 2016-12-28: qty 30

## 2016-12-28 MED ORDER — ACETAMINOPHEN 325 MG PO TABS: 650.0000 mg | ORAL_TABLET | ORAL | Status: DC | PRN

## 2016-12-28 MED ORDER — MIDAZOLAM HCL 2 MG/2ML IJ SOLN
INTRAMUSCULAR | Status: DC | PRN
  Administered 2016-12-28: 1 mg via INTRAVENOUS

## 2016-12-28 MED ORDER — IOPAMIDOL (ISOVUE-300) INJECTION 61%
INTRAVENOUS | Status: DC | PRN
  Administered 2016-12-28: 80 mL via INTRA_ARTERIAL
  Administered 2016-12-28: 100 mL via INTRA_ARTERIAL

## 2016-12-28 MED ORDER — GLIMEPIRIDE 2 MG PO TABS: 2.0000 mg | ORAL_TABLET | Freq: Every day | ORAL | Status: DC

## 2016-12-28 MED ORDER — ASPIRIN 81 MG PO CHEW
81.0000 mg | CHEWABLE_TABLET | Freq: Every day | ORAL | Status: DC
  Administered 2016-12-28 – 2016-12-29 (×2): 81 mg via ORAL
  Filled 2016-12-28 (×2): qty 1

## 2016-12-28 MED ORDER — ALUM & MAG HYDROXIDE-SIMETH 200-200-20 MG/5ML PO SUSP
ORAL | Status: AC
  Filled 2016-12-28: qty 30

## 2016-12-28 MED ORDER — NITROGLYCERIN 5 MG/ML IV SOLN
INTRAVENOUS | Status: AC
  Filled 2016-12-28: qty 10

## 2016-12-28 MED ORDER — CLOPIDOGREL BISULFATE 75 MG PO TABS
ORAL_TABLET | ORAL | Status: DC | PRN
  Administered 2016-12-28: 600 mg via ORAL

## 2016-12-28 MED ORDER — FENTANYL CITRATE (PF) 100 MCG/2ML IJ SOLN
INTRAMUSCULAR | Status: DC | PRN
  Administered 2016-12-28: 50 ug via INTRAVENOUS

## 2016-12-28 MED ORDER — FENTANYL CITRATE (PF) 100 MCG/2ML IJ SOLN
INTRAMUSCULAR | Status: DC | PRN
  Administered 2016-12-28: 25 ug via INTRAVENOUS

## 2016-12-28 MED ORDER — SODIUM CHLORIDE 0.9 % IV SOLN: 250.0000 mL | INTRAVENOUS | Status: DC | PRN

## 2016-12-28 MED ORDER — CLOPIDOGREL BISULFATE 75 MG PO TABS
75.0000 mg | ORAL_TABLET | Freq: Every day | ORAL | Status: DC
  Administered 2016-12-29: 75 mg via ORAL
  Filled 2016-12-28: qty 1

## 2016-12-28 MED ORDER — HEPARIN SODIUM (PORCINE) 5000 UNIT/ML IJ SOLN
5000.0000 [IU] | Freq: Three times a day (TID) | INTRAMUSCULAR | Status: DC
  Administered 2016-12-28 – 2016-12-29 (×3): 5000 [IU] via SUBCUTANEOUS
  Filled 2016-12-28 (×3): qty 1

## 2016-12-28 MED ORDER — HYDRALAZINE HCL 20 MG/ML IJ SOLN: 5.0000 mg | INTRAMUSCULAR | Status: AC | PRN

## 2016-12-28 MED ORDER — ONDANSETRON HCL 4 MG/2ML IJ SOLN
4.0000 mg | Freq: Four times a day (QID) | INTRAMUSCULAR | Status: DC | PRN
  Administered 2016-12-28: 4 mg via INTRAVENOUS
  Filled 2016-12-28: qty 2

## 2016-12-28 MED ORDER — MIDAZOLAM HCL 2 MG/2ML IJ SOLN
INTRAMUSCULAR | Status: AC
  Filled 2016-12-28: qty 2

## 2016-12-28 MED ORDER — SODIUM CHLORIDE 0.9% FLUSH: 3.0000 mL | INTRAVENOUS | Status: DC | PRN

## 2016-12-28 MED ORDER — LABETALOL HCL 5 MG/ML IV SOLN: 10.0000 mg | INTRAVENOUS | Status: AC | PRN

## 2016-12-28 MED ORDER — SODIUM CHLORIDE 0.9% FLUSH
3.0000 mL | Freq: Two times a day (BID) | INTRAVENOUS | Status: DC
  Administered 2016-12-28: 3 mL via INTRAVENOUS

## 2016-12-28 MED ORDER — CLOPIDOGREL BISULFATE 75 MG PO TABS
ORAL_TABLET | ORAL | Status: AC
  Filled 2016-12-28: qty 8

## 2016-12-28 MED ORDER — SODIUM CHLORIDE 0.9 % IV SOLN
INTRAVENOUS | Status: AC | PRN
  Administered 2016-12-28: 0.25 mg/kg/h via INTRAVENOUS
  Administered 2016-12-28: 1.75 mg/kg/h via INTRAVENOUS

## 2016-12-28 MED ORDER — INSULIN ASPART 100 UNIT/ML ~~LOC~~ SOLN
0.0000 [IU] | Freq: Three times a day (TID) | SUBCUTANEOUS | Status: DC
  Administered 2016-12-28 – 2016-12-29 (×2): 3 [IU] via SUBCUTANEOUS
  Filled 2016-12-28 (×2): qty 1

## 2016-12-28 MED ORDER — HEPARIN BOLUS VIA INFUSION
3200.0000 [IU] | Freq: Once | INTRAVENOUS | Status: AC
  Administered 2016-12-28: 3200 [IU] via INTRAVENOUS
  Filled 2016-12-28: qty 3200

## 2016-12-28 MED ORDER — CARVEDILOL 3.125 MG PO TABS
3.1250 mg | ORAL_TABLET | Freq: Two times a day (BID) | ORAL | Status: DC
  Administered 2016-12-28 – 2016-12-29 (×2): 3.125 mg via ORAL
  Filled 2016-12-28 (×2): qty 1

## 2016-12-28 MED ORDER — SODIUM CHLORIDE 0.9 % WEIGHT BASED INFUSION: 1.0000 mL/kg/h | INTRAVENOUS | Status: AC

## 2016-12-28 MED ORDER — BIVALIRUDIN BOLUS VIA INFUSION - CUPID
INTRAVENOUS | Status: DC | PRN
  Administered 2016-12-28: 113.55 mg via INTRAVENOUS

## 2016-12-28 MED ORDER — INSULIN GLARGINE 100 UNIT/ML ~~LOC~~ SOLN
10.0000 [IU] | Freq: Every day | SUBCUTANEOUS | Status: DC
Start: 2016-12-28 — End: 2016-12-29
  Administered 2016-12-28 – 2016-12-29 (×2): 10 [IU] via SUBCUTANEOUS
  Filled 2016-12-28 (×2): qty 0.1

## 2016-12-28 SURGICAL SUPPLY — 17 items
BALLN TREK RX 3.0X12 (BALLOONS) ×3
BALLOON TREK RX 3.0X12 (BALLOONS) ×1 IMPLANT
CATH 5FR JL4 DIAGNOSTIC (CATHETERS) ×3 IMPLANT
CATH INFINITI 5FR ANG PIGTAIL (CATHETERS) ×3 IMPLANT
CATH INFINITI JR4 5F (CATHETERS) ×3 IMPLANT
CATH VISTA GUIDE 6FR XB3.5 (CATHETERS) ×3 IMPLANT
DEVICE CLOSURE MYNXGRIP 6/7F (Vascular Products) ×3 IMPLANT
DEVICE INFLAT 30 PLUS (MISCELLANEOUS) ×3 IMPLANT
GUIDEWIRE 3MM J TIP .035 145 (WIRE) ×3 IMPLANT
KIT MANI 3VAL PERCEP (MISCELLANEOUS) ×3 IMPLANT
NEEDLE PERC 18GX7CM (NEEDLE) ×3 IMPLANT
PACK CARDIAC CATH (CUSTOM PROCEDURE TRAY) ×3 IMPLANT
SHEATH AVANTI 5FR X 11CM (SHEATH) ×3 IMPLANT
SHEATH AVANTI 6FR X 11CM (SHEATH) ×3 IMPLANT
STENT RESOLUTE ONYX 4.5X15 (Permanent Stent) ×3 IMPLANT
WIRE ASAHI PROWATER 180CM (WIRE) ×3 IMPLANT
WIRE EMERALD 3MM-J .035X150CM (WIRE) ×3 IMPLANT

## 2016-12-28 NOTE — Progress Notes (Signed)
[place] [facility] [place] NAME: Justin Hammond    MR#:  [id number]  DATE OF BIRTH:  1966-02-11  SUBJECTIVE:  CHIEF COMPLAINT:   Chief Complaint  Patient presents with  . Shortness of Breath    Came with chest pain, mild elevation of troponin. 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  REVIEW OF SYSTEMS:  CONSTITUTIONAL: No fever, fatigue or weakness.  EYES: No blurred or double vision.  EARS, NOSE, AND THROAT: No tinnitus or ear pain.  RESPIRATORY: No cough, shortness of breath, wheezing or hemoptysis.  CARDIOVASCULAR: No chest pain, orthopnea, edema.  GASTROINTESTINAL: No nausea, vomiting, diarrhea or abdominal pain.  GENITOURINARY: No dysuria, hematuria.  ENDOCRINE: No polyuria, nocturia,  HEMATOLOGY: No anemia, easy bruising or bleeding SKIN: No rash or lesion. MUSCULOSKELETAL: No joint pain or arthritis.   NEUROLOGIC: No tingling, numbness, weakness.  PSYCHIATRY: No anxiety or depression.   ROS  DRUG ALLERGIES:   Allergies  Allergen Reactions  . Penicillins Itching    Has patient had a PCN reaction causing immediate rash, facial/tongue/throat swelling, SOB or lightheadedness with hypotension: No Has patient had a PCN reaction causing severe rash involving mucus membranes or skin necrosis: No Has patient had a PCN reaction that required hospitalization: No Has patient had a PCN reaction occurring within the last 10 years: No If all of the above answers are "NO", then may proceed with Cephalosporin use.     VITALS:  Blood pressure 136/70, pulse 76, temperature 98.6 F (37 C), temperature source Oral, resp. rate 18, height 5\' 8"  (1.727 m), weight (!) 151.4 kg (333 lb 11.2 oz), SpO2 96 %.  PHYSICAL EXAMINATION:  GENERAL:  51 y.o.-year-old morbidly obese patient lying in the bed with no acute distress.  EYES: Pupils equal, round, reactive to light and accommodation.  No scleral icterus. Extraocular muscles intact.  HEENT: Head atraumatic, normocephalic. Oropharynx and nasopharynx clear.  NECK:  Supple, no jugular venous distention. No thyroid enlargement, no tenderness.  LUNGS: Normal breath sounds bilaterally, no wheezing, rales,rhonchi or crepitation. No use of accessory muscles of respiration.  CARDIOVASCULAR: S1, S2 normal. No murmurs, rubs, or gallops.  ABDOMEN: Soft, nontender, nondistended. Bowel sounds present. No organomegaly or mass.  EXTREMITIES: No pedal edema, cyanosis, or clubbing.  NEUROLOGIC: Cranial nerves II through XII are intact. Muscle strength 5/5 in all extremities. Sensation intact. Gait not checked.  PSYCHIATRIC: The patient is alert and oriented x 3.  SKIN: No obvious rash, lesion, or ulcer.   Physical Exam LABORATORY PANEL:   CBC  Recent Labs Lab 12/28/16 0003  WBC 7.4  HGB 14.8  HCT 42.7  PLT 190   ------------------------------------------------------------------------------------------------------------------  Chemistries   Recent Labs Lab 12/27/16 0707 12/28/16 0003  NA 134* 134*  K 4.1 3.8  CL 102 100*  CO2 23 25  GLUCOSE 268* 218*  BUN 18 15  CREATININE 1.08 0.88  CALCIUM 9.0 8.9  AST 42*  --   ALT 45  --   ALKPHOS 116  --   BILITOT 0.5  --    ------------------------------------------------------------------------------------------------------------------  Cardiac Enzymes  Recent Labs Lab 12/27/16 1526 12/27/16 1741  TROPONINI 0.21* 0.21*   ------------------------------------------------------------------------------------------------------------------  RADIOLOGY:  Dg Chest 2 View  Result Date: 12/27/2016 CLINICAL DATA:  51 year old male with history of acute onset of shortness of breath. Clinical concern for pneumonia. EXAM: CHEST  2 VIEW COMPARISON:  Chest x-ray in 01/22/2006.  FINDINGS: Lung volumes are normal. No consolidative airspace disease. No pleural effusions. No pneumothorax.  No pulmonary nodule or mass noted. Pulmonary vasculature and the cardiomediastinal silhouette are within normal limits. IMPRESSION: No radiographic evidence of acute cardiopulmonary disease. Electronically Signed   By: Vinnie Langton M.D.   On: 12/27/2016 07:59   Ct Angio Chest Pe W And/or Wo Contrast  Result Date: 12/27/2016 CLINICAL DATA:  Shortness of Breath EXAM: CT ANGIOGRAPHY CHEST WITH CONTRAST TECHNIQUE: Multidetector CT imaging of the chest was performed using the standard protocol during bolus administration of intravenous contrast. Multiplanar CT image reconstructions and MIPs were obtained to evaluate the vascular anatomy. CONTRAST:  100 mL Isovue 370 nonionic COMPARISON:  Chest radiograph December 27, 2016 FINDINGS: Cardiovascular: There is no demonstrable pulmonary embolus. There is no thoracic aortic aneurysm or dissection. Visualized great vessels appear unremarkable. There are foci of coronary artery calcification present. No appreciable aortic calcification. Pericardium is not appreciably thickened. There is prominence of the main pulmonary outflow tract, measuring 3.5 cm in diameter. This is a finding suggestive of pulmonary arterial hypertension. Mediastinum/Nodes: Visualized thyroid appears unremarkable. There is no appreciable thoracic adenopathy. There is a small hiatal hernia. Lungs/Pleura: There is no edema or consolidation. No pleural effusion or pleural thickening. Upper Abdomen: Visualized upper abdominal structures appear unremarkable. Musculoskeletal: There is degenerative change in the thoracic spine. There is mild degenerative change in each shoulder. No blastic or lytic bone lesions. Review of the MIP images confirms the above findings. IMPRESSION: 1.  No demonstrable pulmonary embolus. 2. Prominence of the main pulmonary outflow tract, a finding suggesting pulmonary arterial hypertension. 3.  No edema or consolidation. 4.  No evident adenopathy. 5.  There are foci of coronary  artery calcification. Electronically Signed   By: Lowella Grip III M.D.   On: 12/27/2016 08:28    ASSESSMENT AND PLAN:   Principal Problem:   NSTEMI (non-ST elevated myocardial infarction) (HCC)   * Non-ST elevation MI    IV heparin drip ordered .   Checked lipid panel- TG high and hemoglobin A1c is also high, monitored on telemetry and followed serial troponin. Cardio consult appreciated,  Cath is done and stent placed in LAD. Dietician to councell about dietary habit.     On ASA, Plavix, statin, add coreg- low dose.  * Hyperlipidemia   Continue statin and check lipid panel.  * Diabetes   We'll hold oral agents for now and keep on sliding scale coverage.   Due to high HBA1c- start on levemir and novolog with meals.  * Proteinuria   Continue losartan.    All the records are reviewed and case discussed with Care Management/Social Workerr. Management plans discussed with the patient, family and they are in agreement.  CODE STATUS: full.  TOTAL TIME TAKING CARE OF THIS PATIENT: 35 minutes.     POSSIBLE D/C IN 1-2 DAYS, DEPENDING ON CLINICAL CONDITION.   Vaughan Basta M.D on 12/28/2016   Between 7am to 6pm - Pager - [phone]  After 6pm go to [email or web address] - password [email or web address] [facility]  Office  [phone]  CC: Primary care physician; Albina Billet, MD  Note: This dictation was prepared with Dragon dictation along with smaller phrase technology. Any transcriptional errors that result from this process are unintentional.

## 2016-12-28 NOTE — Progress Notes (Signed)
Pt returns from cardiac cath s/p stent placement, right groin site is clean, dry and intact. No hematoma noted, pulses positive bilaterally, VSS.  I will continue to assess.

## 2016-12-28 NOTE — Progress Notes (Signed)
Inpatient Diabetes Program Recommendations  AACE/ADA: New Consensus Statement on Inpatient Glycemic Control (2015)  Target Ranges:  Prepandial:   less than 140 mg/dL      Peak postprandial:   less than 180 mg/dL (1-2 hours)      Critically ill patients:  140 - 180 mg/dL   Lab Results  Component Value Date   GLUCAP 256 (H) 12/27/2016   HGBA1C 8.7 (H) 12/27/2016    Diabetes history: Type 2 Outpatient Diabetes medications: Amaryl 63m qday, Metformin 5080mbid  Current orders for Inpatient glycemic control: Novolog 0-9 units tid, Amaryl 2 mg qday  Inpatient Diabetes Program Recommendations:  Met with patient, his wife and cousin. Patient tells me he had never had A1C completed with previous doctor but when he transferred to Dr. TaHall Businghe was told his A1C was 14%.  Amaryl was started approximately 4 months ago and A1C this week was 8.7%. Patient also takes Metformin and it was doubled at the last visit. Patient is scheduled to see Dr. TaHall Busingext Tuesday- I have asked the patient to discuss further options for A1C reduction- SGLT2 inhibitors may be an ideal addition as it will help lower  A1C, may decrease some weight and may offer additional heart protection.   Reviewed at length, the need to change diet.  He reveals that he eats out each lunch (chicken, pizza, burger) and enjoys buffets.  For breakfast, he eats sausage, egg and cheese sandwich and at a breakfast buffet, the first plate is bacon, gravy and biscuits and he eats 2 plates full of food. Strongly encouraged to eat protein, 4 carbohydrate servings and vegetables at each meal, but given his hatred of vegetables and his current diet, I have suggested he start with limiting meals to one plate and removing 1 item at each meal (bacon or cheese or sausage, try thin crust pizza, ask for light cheese or no pepperoni) to begin making healthy changes.   Discussed with RN Tammy, my suggestion for Lantus insulin while inpatient. Based on pre-op  blood sugars and because he did not received Novolog prior to surgery this am, I anticipate his CBG will be elevated.   Consider Novolog 0-15 units tid, add Novolog 0-5 units qhs.   Text page to Dr. VaBoyce Medicit 3:03pm  JuGentry FitzRN, BAIllinoisIndianaMHSouth CarolinaCDE Diabetes Coordinator Inpatient Diabetes Program  [phone] Pager) [phone]/02/2017 3:04 PM

## 2016-12-28 NOTE — Progress Notes (Signed)
Patient remains clinically stable post procedure. Vitals stable, sinus per monitor. No bleeding nor hematoma at right groin site.

## 2016-12-28 NOTE — Plan of Care (Signed)
Problem: Not Ready for Diet/Lifestyle Change (NB-1.3) Goal: Nutrition education Formal process to instruct or train a patient/client in a skill or to impart knowledge to help patients/clients voluntarily manage or modify food choices and eating behavior to maintain or improve health. Outcome: Adequate for Discharge  RD consulted for nutrition education regarding diabetes.   Lab Results  Component Value Date   HGBA1C 8.7 (H) 12/27/2016    RD provided "Carbohydrate Counting for People with Diabetes" handout from the Academy of Nutrition and Dietetics. Discussed different food groups and their effects on blood sugar, emphasizing carbohydrate-containing foods. Provided list of carbohydrates and recommended serving sizes of common foods.  Discussed importance of controlled and consistent carbohydrate intake throughout the day. Provided examples of ways to balance meals/snacks and encouraged intake of high-fiber, whole grain complex carbohydrates. Teach back method used.  Expect fair compliance.  Body mass index is 50.74 kg/m. Pt meets criteria for obese class III based on current BMI.  Current diet order is carb mod, patient is consuming approximately 100% of meals at this time. Labs and medications reviewed. No further nutrition interventions warranted at this time. RD contact information provided. If additional nutrition issues arise, please re-consult RD.  Justin Hammond. Justin Kronberg, MS, RD LDN Inpatient Clinical Dietitian Pager [phone]

## 2016-12-28 NOTE — Progress Notes (Signed)
[place] [facility]  SUBJECTIVE: Status post PCI of LAD. Doing well currently   Vitals:   12/28/16 1015 12/28/16 1030 12/28/16 1045 12/28/16 1108  BP:  (!) 146/77  136/77  Pulse: 66 67 70 66  Resp: 12 12 16    Temp:      TempSrc:      SpO2: 93% 95% 94% 97%  Weight:      Height:        Intake/Output Summary (Last 24 hours) at 12/28/16 1251 Last data filed at 12/28/16 8469  Gross per 24 hour  Intake           772.78 ml  Output                0 ml  Net           772.78 ml    LABS: Basic Metabolic Panel:  Recent Labs  12/27/16 0707 12/28/16 0003  NA 134* 134*  K 4.1 3.8  CL 102 100*  CO2 23 25  GLUCOSE 268* 218*  BUN 18 15  CREATININE 1.08 0.88  CALCIUM 9.0 8.9   Liver Function Tests:  Recent Labs  12/27/16 0707  AST 42*  ALT 45  ALKPHOS 116  BILITOT 0.5  PROT 6.4*  ALBUMIN 3.7   No results for input(s): LIPASE, AMYLASE in the last 72 hours. CBC:  Recent Labs  12/27/16 0707 12/28/16 0003  WBC 6.2 7.4  NEUTROABS 4.5  --   HGB 15.3 14.8  HCT 44.2 42.7  MCV 86.1 85.9  PLT 189 190   Cardiac Enzymes:  Recent Labs  12/27/16 0953 12/27/16 1526 12/27/16 1741  TROPONINI 0.13* 0.21* 0.21*   BNP: Invalid input(s): POCBNP D-Dimer: No results for input(s): DDIMER in the last 72 hours. Hemoglobin A1C:  Recent Labs  12/27/16 0953  HGBA1C 8.7*   Fasting Lipid Panel:  Recent Labs  12/27/16 0953  CHOL 140  HDL 26*  LDLCALC UNABLE TO CALCULATE IF TRIGLYCERIDE OVER 400 mg/dL  TRIG 527*  CHOLHDL 5.4   Thyroid Function Tests: No results for input(s): TSH, T4TOTAL, T3FREE, THYROIDAB in the last 72 hours.  Invalid input(s): FREET3 Anemia Panel: No results for input(s): VITAMINB12, FOLATE, FERRITIN, TIBC, IRON, RETICCTPCT in the last 72 hours.   Physical Exam: Blood pressure 136/77, pulse 66, temperature 98.2 F (36.8 C), temperature source Oral, resp. rate 16, height 5\' 8"  (1.727 m), weight (!)  151.4 kg (333 lb 11.2 oz), SpO2 97 %.   Wt Readings from Last 1 Encounters:  12/27/16 (!) 151.4 kg (333 lb 11.2 oz)     General appearance: alert and cooperative Resp: clear to auscultation bilaterally Cardio: regular rate and rhythm GI: soft, non-tender; bowel sounds normal; no masses,  no organomegaly Extremities: extremities normal, atraumatic, no cyanosis or edema Neurologic: Grossly normal  TELEMETRY: Reviewed telemetry pt in normal sinus rhythm:  ASSESSMENT AND PLAN:  Principal Problem:   NSTEMI (non-ST elevated myocardial infarction) (HCC)-cardiac catheter revealed 99% proximal LAD with an occluded RCA with collaterals from the left. Currently underwent PCI of the LAD with placement of a drug-eluting stent. Doing well post PCI. There is no evidence of complication. We'll continue with dual antiplatelet therapy with aspirin and Plavix and continue with beta blocker, high intensity statins and afterload reduction. Will need phase II cardiac rehabilitation. We'll monitor overnight and consider discharge in a.m.    Teodoro Spray, MD, [facility] 12/28/2016 12:51 PM

## 2016-12-28 NOTE — Progress Notes (Signed)
Patient clinically stable post heart cath with DES stent placement of LAD per Dr Saralyn Pilar. Vitals stable, denies complaints at this time. Family at bedside. Sinus rhythm per monitor. Right groin without bleeding nor hematoma. Report called to Tammy RN on telemetry with plan reviewed,angiomax gtt to be infused until 1040am. Ns infusion per orders.

## 2016-12-28 NOTE — Progress Notes (Signed)
Inpatient Diabetes Program Recommendations  AACE/ADA: New Consensus Statement on Inpatient Glycemic Control (2015)  Target Ranges:  Prepandial:   less than 140 mg/dL      Peak postprandial:   less than 180 mg/dL (1-2 hours)      Critically ill patients:  140 - 180 mg/dL   Lab Results  Component Value Date   GLUCAP 256 (H) 12/27/2016   HGBA1C 8.7 (H) 12/27/2016    Review of Glycemic Control   Results for Justin Hammond, Justin Hammond (MRN [id number]) as of 12/28/2016 11:36  Ref. Range 12/27/2016 12:11 12/27/2016 16:31 12/27/2016 20:22  Glucose-Capillary Latest Ref Range: 65 - 99 mg/dL 112 (H) 220 (H) 256 (H)   Diabetes history: Type 2 Outpatient Diabetes medications: Amaryl 4mg  qday, Metformin 500mg  bid  Current orders for Inpatient glycemic control: Novolog 0-9 units tid, Amaryl 2 mg qday  Inpatient Diabetes Program Recommendations: Please d/c Amaryl while inpatient and consider Lantus 15 units qhs (0.1unit/kg) qhs beginning tonight.   A1C elevated- may do better with low dose Lantus for home vs. Amaryl  Gentry Fitz, RN, [place], [place], CDE Diabetes Coordinator Inpatient Diabetes Program  [phone] (Team Pager) [phone] ([place]) 12/28/2016 11:41 AM

## 2016-12-29 LAB — BASIC METABOLIC PANEL
ANION GAP: 8 (ref 5–15)
BUN: 15 mg/dL (ref 6–20)
CO2: 25 mmol/L (ref 22–32)
Calcium: 8.6 mg/dL — ABNORMAL LOW (ref 8.9–10.3)
Chloride: 101 mmol/L (ref 101–111)
Creatinine, Ser: 1.04 mg/dL (ref 0.61–1.24)
GFR calc Af Amer: >60 mL/min (ref >60)
GLUCOSE: 186 mg/dL — AB (ref 65–99)
POTASSIUM: 3.9 mmol/L (ref 3.5–5.1)
Sodium: 134 mmol/L — ABNORMAL LOW (ref 135–145)

## 2016-12-29 LAB — CBC
HEMATOCRIT: 44 % (ref 40.0–52.0)
HEMOGLOBIN: 15 g/dL (ref 13.0–18.0)
MCH: 29.5 pg (ref 26.0–34.0)
MCHC: 34.1 g/dL (ref 32.0–36.0)
MCV: 86.5 fL (ref 80.0–100.0)
Platelets: 188 10*3/uL (ref 150–440)
RBC: 5.09 MIL/uL (ref 4.40–5.90)
RDW: 15.4 % — AB (ref 11.5–14.5)
WBC: 7.7 10*3/uL (ref 3.8–10.6)

## 2016-12-29 LAB — GLUCOSE, CAPILLARY: Glucose-Capillary: 205 mg/dL — ABNORMAL HIGH (ref 65–99)

## 2016-12-29 MED ORDER — INSULIN GLARGINE 100 UNIT/ML ~~LOC~~ SOLN: 10.0000 [IU] | Freq: Every day | SUBCUTANEOUS | 1 refills | Status: DC

## 2016-12-29 MED ORDER — CLOPIDOGREL BISULFATE 75 MG PO TABS: 75.0000 mg | ORAL_TABLET | Freq: Every day | ORAL | 1 refills | Status: DC

## 2016-12-29 MED ORDER — BLOOD GLUCOSE MONITOR KIT: PACK | 0 refills | Status: DC

## 2016-12-29 MED ORDER — CARVEDILOL 3.125 MG PO TABS: 3.1250 mg | ORAL_TABLET | Freq: Two times a day (BID) | ORAL | 1 refills | Status: AC

## 2016-12-29 MED ORDER — ASPIRIN 81 MG PO CHEW: 81.0000 mg | CHEWABLE_TABLET | Freq: Every day | ORAL | 1 refills | Status: DC

## 2016-12-29 MED ORDER — ATORVASTATIN CALCIUM 40 MG PO TABS: 40.0000 mg | ORAL_TABLET | Freq: Every day | ORAL | 1 refills | Status: AC

## 2016-12-29 NOTE — Progress Notes (Signed)
Pt returned demonstration of insulin injection on abdomen.Pt is a new to insulin.  Discussed diet, need for CBGs and instructed pt to record all CBGs until patient visit PCP. Pt was successful with insulin injection and verbalized no questions. Education was added to exit care for patient discharge.

## 2016-12-29 NOTE — Progress Notes (Signed)
[facility]  SUBJECTIVE: no further chest pain. Cath site clean and dry. Distal pulses intact.    Vitals:   12/28/16 1108 12/28/16 1622 12/28/16 1921 12/29/16 0355  BP: 136/77 (!) 145/71 136/70 131/77  Pulse: 66 75 76 64  Resp:  18 18 18   Temp:  (!) 97.5 F (36.4 C) 98.6 F (37 C) 98.6 F (37 C)  TempSrc:   Oral Axillary  SpO2: 97% 96% 96% 96%  Weight:      Height:        Intake/Output Summary (Last 24 hours) at 12/29/16 1011 Last data filed at 12/29/16 0500  Gross per 24 hour  Intake              240 ml  Output             1000 ml  Net             -760 ml    LABS: Basic Metabolic Panel:  Recent Labs  12/28/16 0003 12/29/16 0433  NA 134* 134*  K 3.8 3.9  CL 100* 101  CO2 25 25  GLUCOSE 218* 186*  BUN 15 15  CREATININE 0.88 1.04  CALCIUM 8.9 8.6*   Liver Function Tests:  Recent Labs  12/27/16 0707  AST 42*  ALT 45  ALKPHOS 116  BILITOT 0.5  PROT 6.4*  ALBUMIN 3.7   No results for input(s): LIPASE, AMYLASE in the last 72 hours. CBC:  Recent Labs  12/27/16 0707 12/28/16 0003 12/29/16 0433  WBC 6.2 7.4 7.7  NEUTROABS 4.5  --   --   HGB 15.3 14.8 15.0  HCT 44.2 42.7 44.0  MCV 86.1 85.9 86.5  PLT 189 190 188   Cardiac Enzymes:  Recent Labs  12/27/16 0953 12/27/16 1526 12/27/16 1741  TROPONINI 0.13* 0.21* 0.21*   BNP: Invalid input(s): POCBNP D-Dimer: No results for input(s): DDIMER in the last 72 hours. Hemoglobin A1C:  Recent Labs  12/27/16 0953  HGBA1C 8.7*   Fasting Lipid Panel:  Recent Labs  12/27/16 0953  CHOL 140  HDL 26*  LDLCALC UNABLE TO CALCULATE IF TRIGLYCERIDE OVER 400 mg/dL  TRIG 527*  CHOLHDL 5.4   Thyroid Function Tests: No results for input(s): TSH, T4TOTAL, T3FREE, THYROIDAB in the last 72 hours.  Invalid input(s): FREET3 Anemia Panel: No results for input(s): VITAMINB12, FOLATE, FERRITIN, TIBC, IRON, RETICCTPCT in the last 72 hours.   Physical  Exam: Blood pressure 131/77, pulse 64, temperature 98.6 F (37 C), temperature source Axillary, resp. rate 18, height 5\' 8"  (1.727 m), weight (!) 151.4 kg (333 lb 11.2 oz), SpO2 96 %.   Wt Readings from Last 1 Encounters:  12/27/16 (!) 151.4 kg (333 lb 11.2 oz)     General appearance: alert and cooperative Resp: clear to auscultation bilaterally Cardio: regular rate and rhythm GI: soft, non-tender; bowel sounds normal; no masses,  no organomegaly Pulses: 2+ and symmetric Neurologic: Grossly normal  TELEMETRY: Reviewed telemetry pt in nsr:  ASSESSMENT AND PLAN:  Principal Problem:   NSTEMI (non-ST elevated myocardial infarction) (HCC)-doing well post pci of lad. No evidence of complication from the cath. No chest pain. OK for discharge on asa 81 mg daily, plavix 75 mg daily, carvedilol 2.125 mg bid, losartan 100 mg daily, atorvastatin 40 mg daily and phase II cardiac rehab. OK for discharge today. Follow up in 7-10 days.     Teodoro Spray, MD, [facility] 12/29/2016 10:11 AM

## 2016-12-29 NOTE — Discharge Summary (Signed)
[facility] [place] NAME: Justin Hammond    MR#:  [id number]  DATE OF BIRTH:  [birth year]  DATE OF ADMISSION:  12/27/2016 ADMITTING PHYSICIAN: Vaughan Basta, MD  DATE OF DISCHARGE: 12/29/2016   PRIMARY CARE PHYSICIAN: Albina Billet, MD    ADMISSION DIAGNOSIS:  NSTEMI (non-ST elevated myocardial infarction) ([place]) [I21.4]  DISCHARGE DIAGNOSIS:  Principal Problem:   NSTEMI (non-ST elevated myocardial infarction) ([place])   SECONDARY DIAGNOSIS:   Past Medical History:  Diagnosis Date  . Cancer [facility])    bladder cancer  . Diabetes mellitus without complication ([place])   . Hyperlipidemia   . Proteinuria     HOSPITAL COURSE:   * Non-ST elevation MI IV heparin drip ordered . Checked lipid panel- TG high and hemoglobin A1c is also high, monitored on telemetry and followed serial troponin. Cardio consult appreciated,  Cath is done and stent placed in LAD.   Dietician to councell about dietary habit.   On ASA, Plavix, statin, add coreg- low dose.   Follow in cardio clinic in 1 week.  * Hyperlipidemia Continue statin and check lipid panel.  * Diabetes We'll hold oral agents for now and keep on sliding scale coverage.   Due to high HBA1c- start on levemir and novolog with meals.  * Proteinuria Continue losartan.   DISCHARGE CONDITIONS:   Stable.  CONSULTS OBTAINED:  Treatment Team:  Teodoro Spray, MD  DRUG ALLERGIES:   Allergies  Allergen Reactions  . Penicillins Itching    Has patient had a PCN reaction causing immediate rash, facial/tongue/throat swelling, SOB or lightheadedness with hypotension: No Has patient had a PCN reaction causing severe rash involving mucus membranes or skin necrosis: No Has patient had a PCN reaction that required hospitalization: No Has patient had a PCN reaction occurring within the last 10 years: No If all of the above answers are "NO", then may proceed with  Cephalosporin use.     DISCHARGE MEDICATIONS:   Current Discharge Medication List    START taking these medications   Details  aspirin 81 MG chewable tablet Chew 1 tablet (81 mg total) by mouth daily. Qty: 30 tablet, Refills: 1    blood glucose meter kit and supplies KIT Dispense based on patient and insurance preference. Use up to four times daily as directed. (FOR ICD-9 250.00, 250.01). Keep a record of blood sugar readings and take it to PMD in 1 -2 weeks. Qty: 1 each, Refills: 0    carvedilol (COREG) 3.125 MG tablet Take 1 tablet (3.125 mg total) by mouth 2 (two) times daily with a meal. Qty: 60 tablet, Refills: 1    clopidogrel (PLAVIX) 75 MG tablet Take 1 tablet (75 mg total) by mouth daily with breakfast. Qty: 30 tablet, Refills: 1    insulin glargine (LANTUS) 100 UNIT/ML injection Inject 0.1 mLs (10 Units total) into the skin daily. Qty: 10 mL, Refills: 1      CONTINUE these medications which have CHANGED   Details  atorvastatin (LIPITOR) 40 MG tablet Take 1 tablet (40 mg total) by mouth daily. Qty: 30 tablet, Refills: 1      CONTINUE these medications which have NOT CHANGED   Details  gabapentin (NEURONTIN) 400 MG capsule Take 1 capsule by mouth 3 (three) times daily.    glimepiride (AMARYL) 4 MG tablet Take 1 tablet by mouth daily.    losartan (COZAAR) 100 MG tablet Take 1 tablet by mouth daily.    metFORMIN (  GLUCOPHAGE) 500 MG tablet Take 500 mg by mouth 2 (two) times daily.         DISCHARGE INSTRUCTIONS:    Follow with cardiologist in 1 week. Started on insulin , new- check blood sugar 3-4 times daily.  If you experience worsening of your admission symptoms, develop shortness of breath, life threatening emergency, suicidal or homicidal thoughts you must seek medical attention immediately by calling 911 or calling your MD immediately  if symptoms less severe.  You Must read complete instructions/literature along with all the possible adverse  reactions/side effects for all the Medicines you take and that have been prescribed to you. Take any new Medicines after you have completely understood and accept all the possible adverse reactions/side effects.   Please note  You were cared for by a hospitalist during your hospital stay. If you have any questions about your discharge medications or the care you received while you were in the hospital after you are discharged, you can call the unit and asked to speak with the hospitalist on call if the hospitalist that took care of you is not available. Once you are discharged, your primary care physician will handle any further medical issues. Please note that NO REFILLS for any discharge medications will be authorized once you are discharged, as it is imperative that you return to your primary care physician (or establish a relationship with a primary care physician if you do not have one) for your aftercare needs so that they can reassess your need for medications and monitor your lab values.    Today   CHIEF COMPLAINT:   Chief Complaint  Patient presents with  . Shortness of Breath    HISTORY OF PRESENT ILLNESS:  Justin Hammond  is a 51 y.o. male with a known history of Bladder cancer, diabetes, hyperlipidemia, proteinuria, obesity. For last few days he started having tightness in his chest and shortness of breath associated with exertion and minimal activities like walking a few steps or tying his shoes and concerned with this he went to his primary care's office they referred him to cardiologist. Patient saw Dr. Nehemiah Massed in office 2 days ago, EKG was fine so he was scheduled to have a nuclear stress test as outpatient next week. Patient continued to have pain and tightness with minimal exertion to the point today morning that he decided not to go to his work and instructed to call his primary care doctor's office. So he was advised from doctor's office to go to emergency room, and he came  here. In ER his troponin is noted 0.2 and EKG was normal but because of his typical complaint he started on IV heparin drip for non-ST elevation MI and given to hospitalist team for further management.   VITAL SIGNS:  Blood pressure 131/77, pulse 64, temperature 98.6 F (37 C), temperature source Axillary, resp. rate 18, height 5' 8"  (1.727 m), weight (!) 151.4 kg (333 lb 11.2 oz), SpO2 96 %.  I/O:   Intake/Output Summary (Last 24 hours) at 12/29/16 1049 Last data filed at 12/29/16 0500  Gross per 24 hour  Intake              240 ml  Output             1000 ml  Net             -760 ml    PHYSICAL EXAMINATION:  GENERAL:  51 y.o.-year-old obese patient lying in the bed with no acute  distress.  EYES: Pupils equal, round, reactive to light and accommodation. No scleral icterus. Extraocular muscles intact.  HEENT: Head atraumatic, normocephalic. Oropharynx and nasopharynx clear.  NECK:  Supple, no jugular venous distention. No thyroid enlargement, no tenderness.  LUNGS: Normal breath sounds bilaterally, no wheezing, rales,rhonchi or crepitation. No use of accessory muscles of respiration.  CARDIOVASCULAR: S1, S2 normal. No murmurs, rubs, or gallops.  ABDOMEN: Soft, non-tender, non-distended. Bowel sounds present. No organomegaly or mass.  EXTREMITIES: No pedal edema, cyanosis, or clubbing.  NEUROLOGIC: Cranial nerves II through XII are intact. Muscle strength 5/5 in all extremities. Sensation intact. Gait not checked.  PSYCHIATRIC: The patient is alert and oriented x 3.  SKIN: No obvious rash, lesion, or ulcer.   DATA REVIEW:   CBC  Recent Labs Lab 12/29/16 0433  WBC 7.7  HGB 15.0  HCT 44.0  PLT 188    Chemistries   Recent Labs Lab 12/27/16 0707  12/29/16 0433  NA 134*  < > 134*  K 4.1  < > 3.9  CL 102  < > 101  CO2 23  < > 25  GLUCOSE 268*  < > 186*  BUN 18  < > 15  CREATININE 1.08  < > 1.04  CALCIUM 9.0  < > 8.6*  AST 42*  --   --   ALT 45  --   --   ALKPHOS  116  --   --   BILITOT 0.5  --   --   < > = values in this interval not displayed.  Cardiac Enzymes  Recent Labs Lab 12/27/16 1741  TROPONINI 0.21*    Microbiology Results  No results found for this or any previous visit.  RADIOLOGY:  No results found.  EKG:   Orders placed or performed during the hospital encounter of 12/27/16  . ED EKG  . ED EKG  . EKG 12-Lead  . EKG 12-Lead  . EKG 12-Lead immediately post procedure  . EKG 12-Lead  . EKG 12-Lead immediately post procedure  . EKG 12-Lead  . EKG 12-Lead  . EKG 12-Lead      Management plans discussed with the patient, family and they are in agreement.  CODE STATUS:     Code Status Orders        Start     Ordered   12/28/16 0907  Full code  Continuous     12/28/16 0906    Code Status History    Date Active Date Inactive Code Status Order ID Comments User Context   12/27/2016 11:10 AM 12/28/2016  9:06 AM Full Code [id number]  Vaughan Basta, MD Inpatient      TOTAL TIME TAKING CARE OF THIS PATIENT: 35 minutes.    Vaughan Basta M.D on 12/29/2016 at 10:49 AM  Between 7am to 6pm - Pager - [phone]  After 6pm go to [email or web address] - password [email or web address] [facility]  Office  [phone]  CC: Primary care physician; Albina Billet, MD   Note: This dictation was prepared with Dragon dictation along with smaller phrase technology. Any transcriptional errors that result from this process are unintentional.

## 2016-12-29 NOTE — Discharge Instructions (Signed)
No straneous activities for 1week.

## 2016-12-31 ENCOUNTER — Ambulatory Visit: Payer: BC Managed Care – PPO

## 2017-01-01 ENCOUNTER — Ambulatory Visit: Payer: Self-pay

## 2017-01-01 ENCOUNTER — Other Ambulatory Visit: Payer: Self-pay

## 2017-01-03 DIAGNOSIS — I2510 Atherosclerotic heart disease of native coronary artery without angina pectoris: Secondary | ICD-10-CM | POA: Insufficient documentation

## 2017-02-25 DIAGNOSIS — G4733 Obstructive sleep apnea (adult) (pediatric): Secondary | ICD-10-CM | POA: Insufficient documentation

## 2017-03-22 ENCOUNTER — Ambulatory Visit: Payer: BC Managed Care – PPO | Attending: Internal Medicine

## 2017-03-22 DIAGNOSIS — G4733 Obstructive sleep apnea (adult) (pediatric): Secondary | ICD-10-CM | POA: Insufficient documentation

## 2017-03-22 DIAGNOSIS — G4761 Periodic limb movement disorder: Secondary | ICD-10-CM | POA: Diagnosis not present

## 2017-03-22 DIAGNOSIS — R0683 Snoring: Secondary | ICD-10-CM | POA: Insufficient documentation

## 2018-05-21 IMAGING — CT CT ANGIO CHEST
1 of 6 series · 19 of 36 positions shown · IV contrast (isovue)
Comparison: Chest radiograph December 27, 2016

CLINICAL DATA: Shortness of Breath

EXAM:
CT ANGIOGRAPHY CHEST WITH CONTRAST
TECHNIQUE: Multidetector CT imaging of the chest was performed using the
standard protocol during bolus administration of intravenous
contrast. Multiplanar CT image reconstructions and MIPs were
obtained to evaluate the vascular anatomy.
CONTRAST:  100 mL Isovue 370 nonionic

[Series 5: thins · axial · 0.98mm/px · z∈[-502,-242]mm · 19 of 290 slices shown]
[im 15/290  lung]
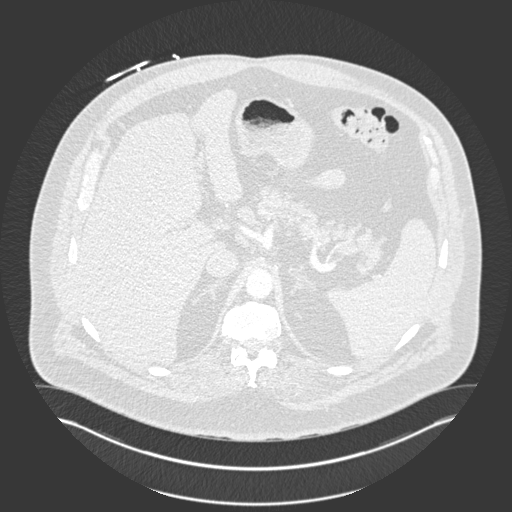
[im 29/290  mediastinal]
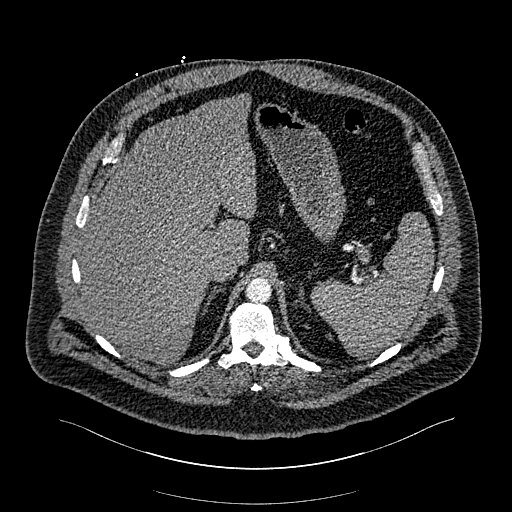
[im 44/290  lung]
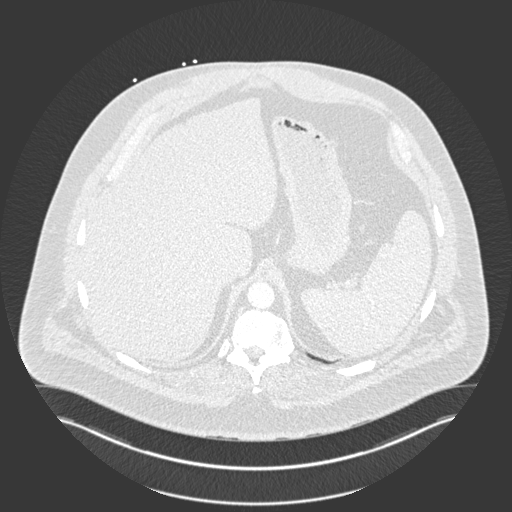
[im 58/290  mediastinal]
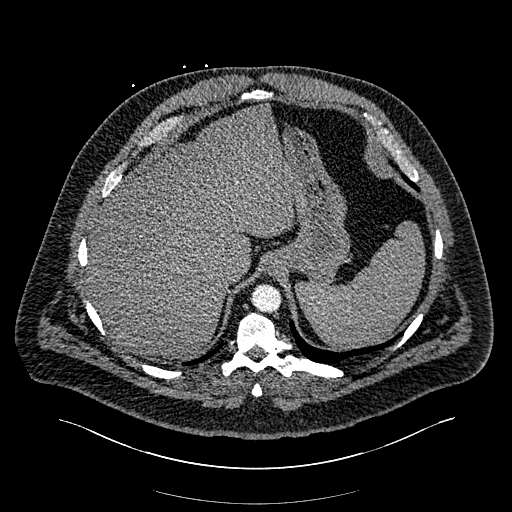
[im 73/290  lung]
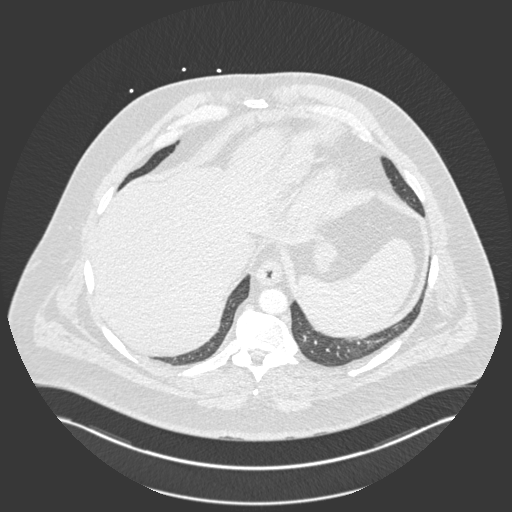
[im 87/290  mediastinal]
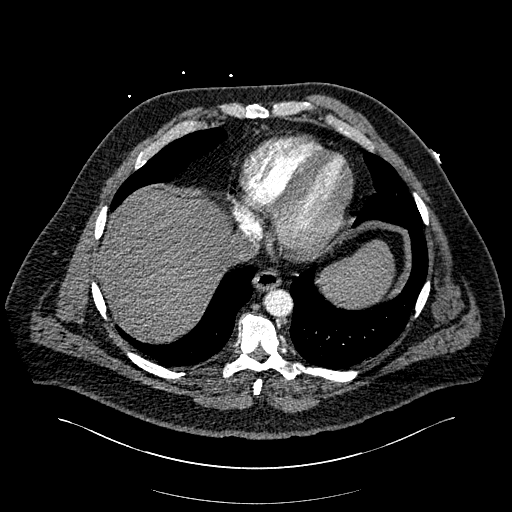
[im 102/290  lung]
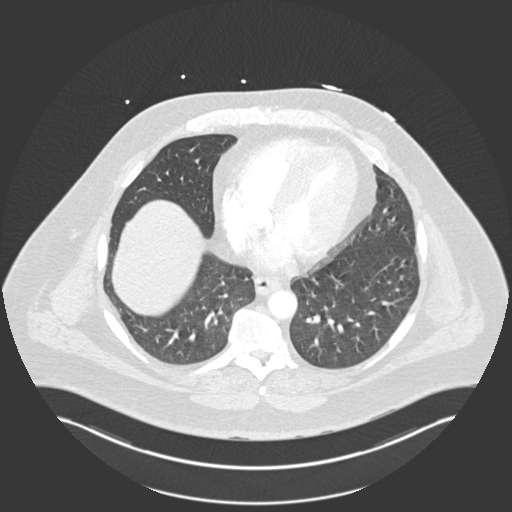
[im 116/290  mediastinal]
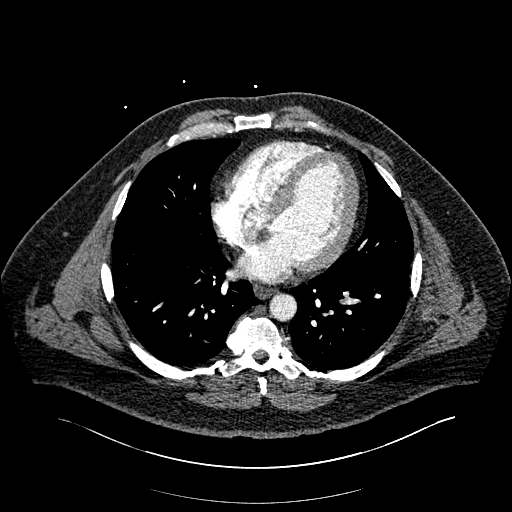
[im 131/290  lung]
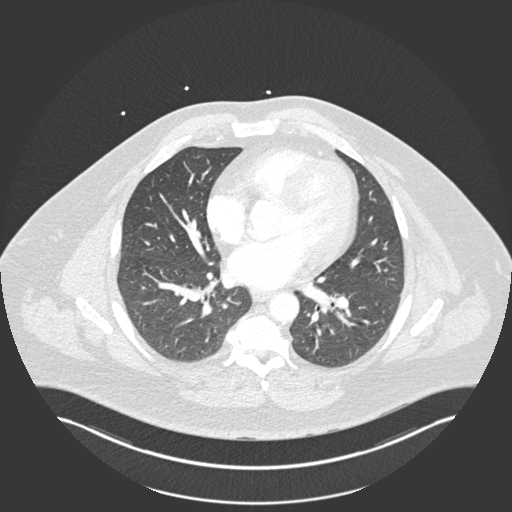
[im 145/290  mediastinal]
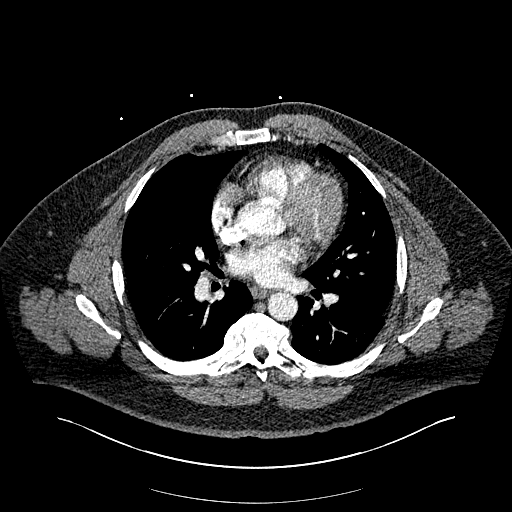
[im 159/290  lung]
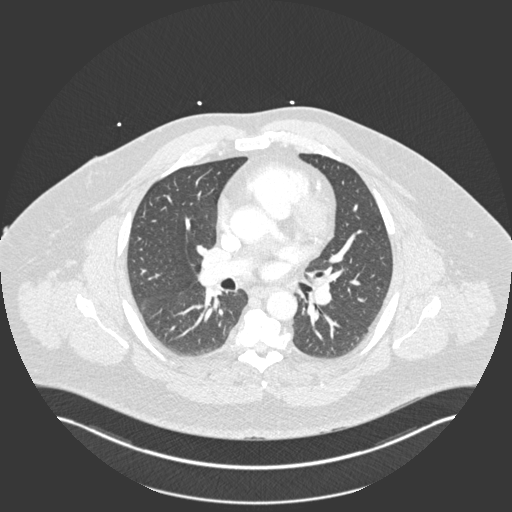
[im 174/290  mediastinal]
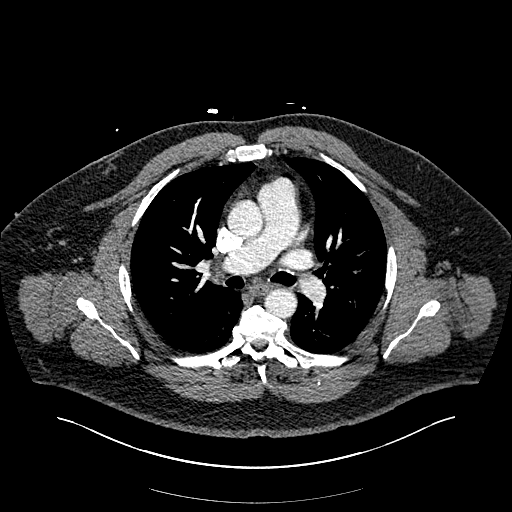
[im 188/290  lung]
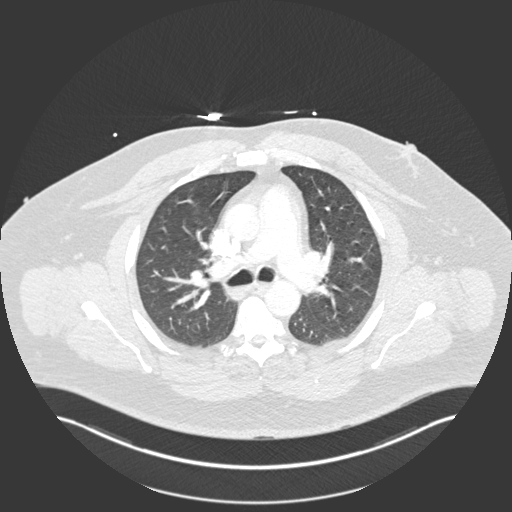
[im 203/290  mediastinal]
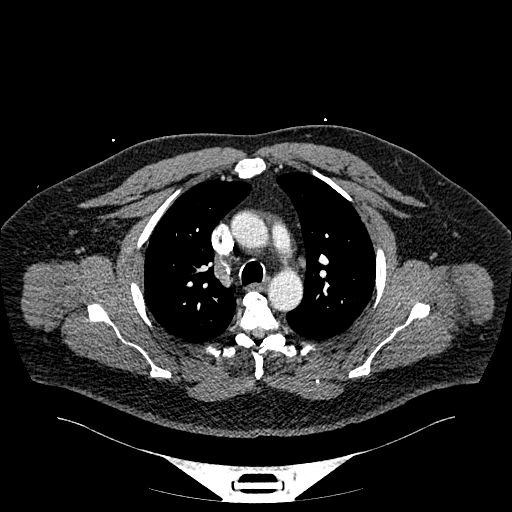
[im 217/290  lung]
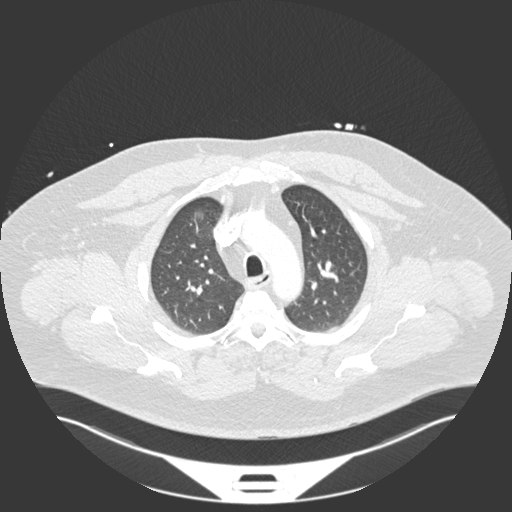
[im 232/290  mediastinal]
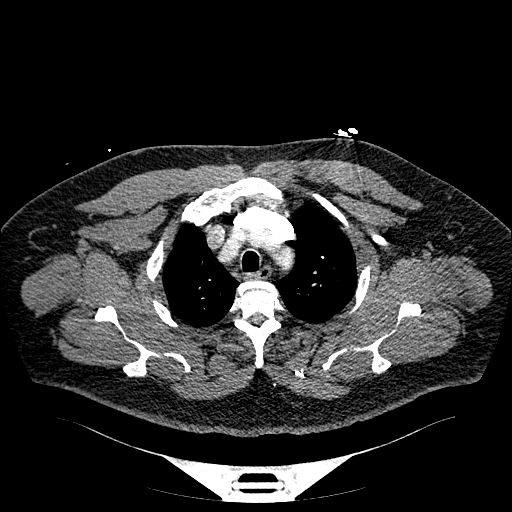
[im 246/290  lung]
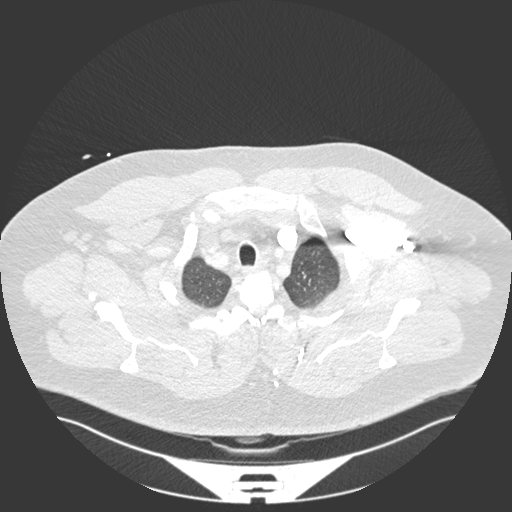
[im 261/290  mediastinal]
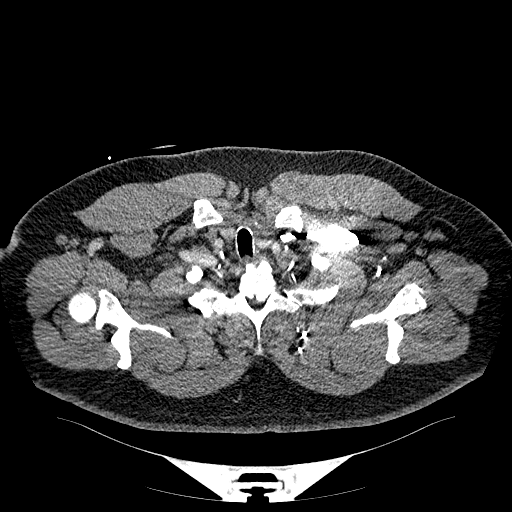
[im 275/290  lung]
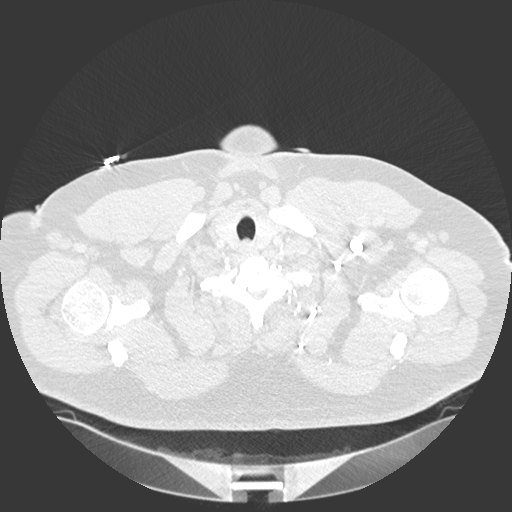

[19 of 36 positions shown; findings below may reference images not displayed]

FINDINGS: Cardiovascular: There is no demonstrable pulmonary embolus. There is
no thoracic aortic aneurysm or dissection. Visualized great vessels
appear unremarkable. There are foci of coronary artery calcification
present. No appreciable aortic calcification. Pericardium is not
appreciably thickened. There is prominence of the main pulmonary
outflow tract, measuring 3.5 cm in diameter. This is a finding
suggestive of pulmonary arterial hypertension.

Mediastinum/Nodes: Visualized thyroid appears unremarkable. There is
no appreciable thoracic adenopathy. There is a small hiatal hernia.

Lungs/Pleura: There is no edema or consolidation. No pleural
effusion or pleural thickening.

Upper Abdomen: Visualized upper abdominal structures appear
unremarkable.

Musculoskeletal: There is degenerative change in the thoracic spine.
There is mild degenerative change in each shoulder. No blastic or
lytic bone lesions.

Review of the MIP images confirms the above findings.
IMPRESSION: 1.  No demonstrable pulmonary embolus.

2. Prominence of the main pulmonary outflow tract, a finding
suggesting pulmonary arterial hypertension.

3.  No edema or consolidation.

4.  No evident adenopathy.

5.  There are foci of coronary artery calcification.

## 2019-05-21 ENCOUNTER — Ambulatory Visit: Payer: BC Managed Care – PPO | Attending: Internal Medicine

## 2019-05-21 DIAGNOSIS — Z20822 Contact with and (suspected) exposure to covid-19: Secondary | ICD-10-CM

## 2019-05-23 ENCOUNTER — Telehealth: Payer: Self-pay

## 2019-05-23 NOTE — Telephone Encounter (Signed)
Pt called for covid results- advised pt results are not back refused MyChart sign up information.

## 2019-05-23 NOTE — Telephone Encounter (Signed)
Pt called for covid results- advised pt that results are not back.

## 2019-05-24 NOTE — Telephone Encounter (Signed)
Pt called for results and advised that results are not back.

## 2019-05-25 ENCOUNTER — Telehealth: Payer: Self-pay

## 2019-05-25 NOTE — Telephone Encounter (Signed)
Received call from patient checking Covid results.  Advised no results at this time.   

## 2019-05-25 NOTE — Telephone Encounter (Signed)
Caller advise that result not back yet

## 2019-05-25 NOTE — Telephone Encounter (Signed)
Pt called back wanting covid test results- dated 05/21/19 pt advised that he will need to be retested. Explained to pt that samples were not picked up- Pt angry and said that he should get an appt when he wants one. Advised pt that there are no appt available tomorrow. Pt scheduled for test Wednesday. Pt stated "ya'll screwed up."

## 2019-05-25 NOTE — Telephone Encounter (Signed)
This encounter was created in error - please disregard.

## 2019-05-27 ENCOUNTER — Ambulatory Visit: Payer: BC Managed Care – PPO | Attending: Internal Medicine

## 2019-05-27 DIAGNOSIS — Z8616 Personal history of COVID-19: Secondary | ICD-10-CM

## 2019-05-27 DIAGNOSIS — Z20822 Contact with and (suspected) exposure to covid-19: Secondary | ICD-10-CM

## 2019-05-27 HISTORY — DX: Personal history of COVID-19: Z86.16

## 2019-05-27 LAB — NOVEL CORONAVIRUS, NAA

## 2019-05-28 ENCOUNTER — Telehealth: Payer: Self-pay

## 2019-05-28 LAB — NOVEL CORONAVIRUS, NAA: SARS-CoV-2, NAA: DETECTED — AB

## 2019-05-28 NOTE — Telephone Encounter (Signed)
Informed pt that results were still pending.  [place]

## 2019-05-29 ENCOUNTER — Ambulatory Visit: Payer: Self-pay

## 2019-05-29 ENCOUNTER — Ambulatory Visit: Payer: Self-pay | Admitting: *Deleted

## 2019-05-29 NOTE — Telephone Encounter (Signed)
Provided covid test results.  Complains of coughing , sneezing , diarrhea stuffy head. Recommended what medication he could take. Will notify HD voiced understanding.  Reviewed isolation protocol. Voiced understanding.

## 2019-05-29 NOTE — Telephone Encounter (Signed)
Patient is calling to receive his COVID test result. Please advise   Called to patient and he has received his covid-19 test results from a nurse.  No questions at time.

## 2019-06-08 ENCOUNTER — Ambulatory Visit: Payer: BC Managed Care – PPO | Attending: Internal Medicine

## 2019-06-08 DIAGNOSIS — Z20822 Contact with and (suspected) exposure to covid-19: Secondary | ICD-10-CM

## 2019-06-09 LAB — NOVEL CORONAVIRUS, NAA: SARS-CoV-2, NAA: NOT DETECTED

## 2019-10-26 ENCOUNTER — Ambulatory Visit: Payer: BC Managed Care – PPO | Admitting: Podiatry

## 2019-10-26 ENCOUNTER — Other Ambulatory Visit: Payer: Self-pay

## 2019-10-26 ENCOUNTER — Encounter: Payer: Self-pay | Admitting: Podiatry

## 2019-10-26 DIAGNOSIS — E1142 Type 2 diabetes mellitus with diabetic polyneuropathy: Secondary | ICD-10-CM | POA: Diagnosis not present

## 2019-10-26 DIAGNOSIS — M79674 Pain in right toe(s): Secondary | ICD-10-CM

## 2019-10-26 DIAGNOSIS — M79675 Pain in left toe(s): Secondary | ICD-10-CM | POA: Diagnosis not present

## 2019-10-26 DIAGNOSIS — B351 Tinea unguium: Secondary | ICD-10-CM | POA: Diagnosis not present

## 2019-10-26 DIAGNOSIS — E1140 Type 2 diabetes mellitus with diabetic neuropathy, unspecified: Secondary | ICD-10-CM | POA: Insufficient documentation

## 2019-10-26 DIAGNOSIS — D689 Coagulation defect, unspecified: Secondary | ICD-10-CM | POA: Insufficient documentation

## 2019-10-26 NOTE — Progress Notes (Signed)
This patient presents the office for an evaluation of his feet.  He presents the office wearing his mother to the office for preventative foot care services.  He states that he is interested in having nail care performed since his nails have become long and thick.  He says these nails are painful. He also relates that the toeson both feet seem to be bound with socks.  He also says he is interested in acquiring a pair of orthoses.  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  He says he has difficulty wearing orthoses since they lift up his foot out of the shoe.   He presents the office today for an evaluation and treatment of his feet.  This patient states that he is diabetic on insulin and metformin  and is taking Plavix.   General Appearance  Alert, conversant and in no acute stress.  Vascular  Dorsalis pedis and posterior tibial  pulses are palpable  bilaterally.  Capillary return is within normal limits  bilaterally. Temperature is within normal limits  bilaterally.  Neurologic  Senn-Weinstein monofilament wire test within normal limits  bilaterally. Muscle power within normal limits bilaterally.  Nails Thick disfigured discolored nails with subungual debris  Hallux nails  B/L. bilaterally. No evidence of bacterial infection or drainage bilaterally.  Orthopedic  No limitations of motion  feet .  No crepitus or effusions noted.  No bony pathology or digital deformities noted.  Retrocalcaneal spur right foot.  Skin  normotropic skin with no porokeratosis noted bilaterally.  No signs of infections or ulcers noted.    Onychomycosis  Diabetes  IE.  Debride nails with nail nipper followed by dremel tool usage.  After discussing his insoles and his shoes I thought he needs to make an appointment with the pedorthist  and to discuss the matter with him.    Gardiner Barefoot DPM

## 2019-11-18 ENCOUNTER — Other Ambulatory Visit: Payer: Self-pay

## 2019-11-18 ENCOUNTER — Ambulatory Visit (INDEPENDENT_AMBULATORY_CARE_PROVIDER_SITE_OTHER): Payer: BC Managed Care – PPO | Admitting: Orthotics

## 2019-11-18 DIAGNOSIS — M79675 Pain in left toe(s): Secondary | ICD-10-CM | POA: Diagnosis not present

## 2019-11-18 DIAGNOSIS — M79674 Pain in right toe(s): Secondary | ICD-10-CM | POA: Diagnosis not present

## 2019-11-18 DIAGNOSIS — B351 Tinea unguium: Secondary | ICD-10-CM

## 2019-11-18 DIAGNOSIS — E1142 Type 2 diabetes mellitus with diabetic polyneuropathy: Secondary | ICD-10-CM

## 2019-11-18 DIAGNOSIS — D689 Coagulation defect, unspecified: Secondary | ICD-10-CM | POA: Diagnosis not present

## 2019-11-18 NOTE — Progress Notes (Signed)
Cast today for f/o to go in work boots.

## 2019-12-09 ENCOUNTER — Ambulatory Visit: Payer: BC Managed Care – PPO | Admitting: Orthotics

## 2019-12-09 ENCOUNTER — Other Ambulatory Visit: Payer: Self-pay

## 2019-12-09 DIAGNOSIS — M79674 Pain in right toe(s): Secondary | ICD-10-CM

## 2019-12-09 DIAGNOSIS — D689 Coagulation defect, unspecified: Secondary | ICD-10-CM

## 2019-12-09 DIAGNOSIS — E1142 Type 2 diabetes mellitus with diabetic polyneuropathy: Secondary | ICD-10-CM

## 2019-12-09 NOTE — Progress Notes (Signed)
Needs to be made 1/2 in wider in FF to fit into custom diabetic shoes.

## 2019-12-11 ENCOUNTER — Other Ambulatory Visit: Payer: Self-pay | Admitting: Internal Medicine

## 2019-12-11 DIAGNOSIS — I25118 Atherosclerotic heart disease of native coronary artery with other forms of angina pectoris: Secondary | ICD-10-CM

## 2019-12-11 DIAGNOSIS — R079 Chest pain, unspecified: Secondary | ICD-10-CM

## 2019-12-11 DIAGNOSIS — R0602 Shortness of breath: Secondary | ICD-10-CM

## 2019-12-24 ENCOUNTER — Other Ambulatory Visit: Payer: Self-pay

## 2019-12-24 ENCOUNTER — Ambulatory Visit
Admission: RE | Admit: 2019-12-24 | Discharge: 2019-12-24 | Disposition: A | Discharge status: Home / Self Care | Payer: BC Managed Care – PPO | Source: Ambulatory Visit | Attending: Internal Medicine | Admitting: Internal Medicine

## 2019-12-24 DIAGNOSIS — R079 Chest pain, unspecified: Secondary | ICD-10-CM | POA: Diagnosis not present

## 2019-12-24 DIAGNOSIS — I10 Essential (primary) hypertension: Secondary | ICD-10-CM | POA: Diagnosis not present

## 2019-12-24 DIAGNOSIS — R0602 Shortness of breath: Secondary | ICD-10-CM | POA: Diagnosis present

## 2019-12-24 DIAGNOSIS — I25118 Atherosclerotic heart disease of native coronary artery with other forms of angina pectoris: Secondary | ICD-10-CM | POA: Insufficient documentation

## 2019-12-24 DIAGNOSIS — Z9289 Personal history of other medical treatment: Secondary | ICD-10-CM

## 2019-12-24 DIAGNOSIS — E785 Hyperlipidemia, unspecified: Secondary | ICD-10-CM | POA: Diagnosis not present

## 2019-12-24 HISTORY — DX: Personal history of other medical treatment: Z92.89

## 2019-12-24 NOTE — Progress Notes (Signed)
*  PRELIMINARY RESULTS* Echocardiogram 2D Echocardiogram has been performed.  [name] 12/24/2019, 11:18 AM

## 2019-12-27 LAB — ECHOCARDIOGRAM COMPLETE
AR max vel: 2.92 cm2
AV Area VTI: 3.47 cm2
AV Area mean vel: 3.07 cm2
AV Mean grad: 3.5 mmHg
AV Peak grad: 5.8 mmHg
Ao pk vel: 1.21 m/s
Area-P 1/2: 3.12 cm2

## 2019-12-28 ENCOUNTER — Encounter
Admission: RE | Admit: 2019-12-28 | Discharge: 2019-12-28 | Disposition: A | Discharge status: Home / Self Care | Payer: BC Managed Care – PPO | Source: Ambulatory Visit | Attending: Internal Medicine | Admitting: Internal Medicine

## 2019-12-28 ENCOUNTER — Other Ambulatory Visit: Payer: Self-pay

## 2019-12-28 DIAGNOSIS — I25118 Atherosclerotic heart disease of native coronary artery with other forms of angina pectoris: Secondary | ICD-10-CM | POA: Diagnosis present

## 2019-12-28 MED ORDER — REGADENOSON 0.4 MG/5ML IV SOLN
0.4000 mg | Freq: Once | INTRAVENOUS | Status: AC
  Administered 2019-12-28: 0.4 mg via INTRAVENOUS

## 2019-12-28 MED ORDER — TECHNETIUM TC 99M TETROFOSMIN IV KIT
10.0000 | PACK | Freq: Once | INTRAVENOUS | Status: AC | PRN
  Administered 2019-12-28: 29.12 via INTRAVENOUS

## 2019-12-29 ENCOUNTER — Encounter
Admission: RE | Admit: 2019-12-29 | Discharge: 2019-12-29 | Disposition: A | Discharge status: Home / Self Care | Payer: BC Managed Care – PPO | Source: Ambulatory Visit | Attending: Internal Medicine | Admitting: Internal Medicine

## 2019-12-29 MED ORDER — TECHNETIUM TC 99M TETROFOSMIN IV KIT
28.7900 | PACK | Freq: Once | INTRAVENOUS | Status: AC | PRN
  Administered 2019-12-29: 28.79 via INTRAVENOUS

## 2019-12-29 MED ORDER — TECHNETIUM TC 99M TETROFOSMIN IV KIT
28.7800 | PACK | Freq: Once | INTRAVENOUS | Status: AC | PRN
  Administered 2019-12-29: 28.78 via INTRAVENOUS

## 2019-12-30 LAB — NM MYOCAR MULTI W/SPECT W/WALL MOTION / EF
Estimated workload: 1 METS
Exercise duration (min): 1 min
Exercise duration (sec): 3 s
LV dias vol: 105 mL (ref 62–150)
LV sys vol: 34 mL
Peak HR: 100 {beats}/min
Rest HR: 71 {beats}/min
SDS: 2
SRS: 0
SSS: 3
TID: 0.87

## 2020-01-28 ENCOUNTER — Telehealth: Payer: Self-pay | Admitting: Podiatry

## 2020-01-28 NOTE — Telephone Encounter (Signed)
Pt called asking where his orthotics where at. He is a [facility] pt and I had given them to Dr Prudence Davidson to take to [facility] for pick up on 8.19 and pt stated he went there on the following day and they were not there. He was told someone would call when they got them and no one ever did.  I did check with front office in [place] and they confirmed they were there and told pt he could pick them up as they were in [place] office.

## 2020-05-31 ENCOUNTER — Ambulatory Visit (INDEPENDENT_AMBULATORY_CARE_PROVIDER_SITE_OTHER): Payer: Self-pay | Admitting: Internal Medicine

## 2020-05-31 DIAGNOSIS — E1142 Type 2 diabetes mellitus with diabetic polyneuropathy: Secondary | ICD-10-CM

## 2020-05-31 DIAGNOSIS — M542 Cervicalgia: Secondary | ICD-10-CM

## 2020-05-31 DIAGNOSIS — E785 Hyperlipidemia, unspecified: Secondary | ICD-10-CM | POA: Insufficient documentation

## 2020-05-31 DIAGNOSIS — Z23 Encounter for immunization: Secondary | ICD-10-CM

## 2020-05-31 DIAGNOSIS — E782 Mixed hyperlipidemia: Secondary | ICD-10-CM

## 2020-05-31 NOTE — Assessment & Plan Note (Signed)
-   I encouraged the patient to eat more vegetables and whole wheat, and to avoid fatty foods like whole milk, hard cheese, egg yolks, margarine, baked sweets, and fried foods.  - I encouraged the patient to live an active lifestyle and complete activities for 40 minutes at least three times per week.  - I instructed the patient to go to the ER if they begin having chest pain.  Take lipitor   40 mg po daily

## 2020-05-31 NOTE — Progress Notes (Signed)
No charge. 

## 2020-05-31 NOTE — Assessment & Plan Note (Signed)
-   I encouraged the patient to regularly check blood sugar.  - I encouraged the patient to monitor diet. I encouraged the patient to eat low-carb and low-sugar to help prevent blood sugar spikes.  - I encouraged the patient to continue following their prescribed treatment plan for diabetes - I informed the patient to get help if blood sugar drops below 54mg/dL, or if suddenly have trouble thinking clearly or breathing.     

## 2021-06-05 DIAGNOSIS — M7541 Impingement syndrome of right shoulder: Secondary | ICD-10-CM | POA: Insufficient documentation

## 2021-10-09 ENCOUNTER — Emergency Department: Payer: BC Managed Care – PPO

## 2021-10-09 ENCOUNTER — Emergency Department
Admission: EM | Admit: 2021-10-09 | Discharge: 2021-10-09 | Disposition: A | Discharge status: Home / Self Care | Payer: BC Managed Care – PPO | Attending: Emergency Medicine | Admitting: Emergency Medicine

## 2021-10-09 ENCOUNTER — Other Ambulatory Visit: Payer: Self-pay

## 2021-10-09 ENCOUNTER — Encounter: Payer: Self-pay | Admitting: Emergency Medicine

## 2021-10-09 DIAGNOSIS — Z20822 Contact with and (suspected) exposure to covid-19: Secondary | ICD-10-CM | POA: Diagnosis not present

## 2021-10-09 DIAGNOSIS — R001 Bradycardia, unspecified: Secondary | ICD-10-CM | POA: Insufficient documentation

## 2021-10-09 DIAGNOSIS — R0602 Shortness of breath: Secondary | ICD-10-CM | POA: Insufficient documentation

## 2021-10-09 DIAGNOSIS — R109 Unspecified abdominal pain: Secondary | ICD-10-CM | POA: Diagnosis present

## 2021-10-09 DIAGNOSIS — K8010 Calculus of gallbladder with chronic cholecystitis without obstruction: Secondary | ICD-10-CM | POA: Insufficient documentation

## 2021-10-09 DIAGNOSIS — R0600 Dyspnea, unspecified: Secondary | ICD-10-CM

## 2021-10-09 DIAGNOSIS — E119 Type 2 diabetes mellitus without complications: Secondary | ICD-10-CM | POA: Diagnosis not present

## 2021-10-09 LAB — BASIC METABOLIC PANEL
Anion gap: 10 (ref 5–15)
BUN: 16 mg/dL (ref 6–20)
CO2: 25 mmol/L (ref 22–32)
Calcium: 9.6 mg/dL (ref 8.9–10.3)
Chloride: 102 mmol/L (ref 98–111)
Creatinine, Ser: 0.97 mg/dL (ref 0.61–1.24)
GFR, Estimated: >60 mL/min (ref >60)
Glucose, Bld: 122 mg/dL — ABNORMAL HIGH (ref 70–99)
Potassium: 4.4 mmol/L (ref 3.5–5.1)
Sodium: 137 mmol/L (ref 135–145)

## 2021-10-09 LAB — HEPATIC FUNCTION PANEL
ALT: 34 U/L (ref 0–44)
AST: 28 U/L (ref 15–41)
Albumin: 4.1 g/dL (ref 3.5–5.0)
Alkaline Phosphatase: 94 U/L (ref 38–126)
Bilirubin, Direct: <0.1 mg/dL (ref 0.0–0.2)
Total Bilirubin: 0.6 mg/dL (ref 0.3–1.2)
Total Protein: 7 g/dL (ref 6.5–8.1)

## 2021-10-09 LAB — CBC
HCT: 50 % (ref 39.0–52.0)
Hemoglobin: 16.6 g/dL (ref 13.0–17.0)
MCH: 29.6 pg (ref 26.0–34.0)
MCHC: 33.2 g/dL (ref 30.0–36.0)
MCV: 89.1 fL (ref 80.0–100.0)
Platelets: 220 10*3/uL (ref 150–400)
RBC: 5.61 MIL/uL (ref 4.22–5.81)
RDW: 14.4 % (ref 11.5–15.5)
WBC: 7.4 10*3/uL (ref 4.0–10.5)
nRBC: 0 % (ref 0.0–0.2)

## 2021-10-09 LAB — RESP PANEL BY RT-PCR (FLU A&B, COVID) ARPGX2
Influenza A by PCR: NEGATIVE
Influenza B by PCR: NEGATIVE
SARS Coronavirus 2 by RT PCR: NEGATIVE

## 2021-10-09 LAB — TROPONIN I (HIGH SENSITIVITY)
Troponin I (High Sensitivity): 5 ng/L (ref <18)
Troponin I (High Sensitivity): 5 ng/L (ref <18)

## 2021-10-09 LAB — D-DIMER, QUANTITATIVE: D-Dimer, Quant: 0.61 ug/mL-FEU — ABNORMAL HIGH (ref 0.00–0.50)

## 2021-10-09 LAB — LIPASE, BLOOD: Lipase: 43 U/L (ref 11–51)

## 2021-10-09 LAB — BRAIN NATRIURETIC PEPTIDE: B Natriuretic Peptide: 21 pg/mL (ref 0.0–100.0)

## 2021-10-09 MED ORDER — LIDOCAINE VISCOUS HCL 2 % MT SOLN
15.0000 mL | Freq: Once | OROMUCOSAL | Status: AC
  Administered 2021-10-09: 15 mL via ORAL
  Filled 2021-10-09: qty 15

## 2021-10-09 MED ORDER — ALUM & MAG HYDROXIDE-SIMETH 200-200-20 MG/5ML PO SUSP
30.0000 mL | Freq: Once | ORAL | Status: AC
Start: 2021-10-09 — End: 2021-10-09
  Administered 2021-10-09: 30 mL via ORAL
  Filled 2021-10-09: qty 30

## 2021-10-09 MED ORDER — OMEPRAZOLE MAGNESIUM 20 MG PO TBEC: 40.0000 mg | DELAYED_RELEASE_TABLET | Freq: Every day | ORAL | 11 refills | Status: DC

## 2021-10-09 NOTE — Discharge Instructions (Addendum)
-  Follow-up with your primary care provider and cardiologist, as discussed.  -Follow up with the surgeon listed above in regards to your gallstones if your symptoms persist.   -You may take the omeprazole as long as you are not still taking the Plavix (clopidogrel).  If you are taking the Plavix, do not take the omeprazole.  Continue treating your symptoms with over-the-counter heartburn medications as needed.  -Return to the emergency department anytime if you begin to experience any new or worsening symptoms.

## 2021-10-09 NOTE — ED Triage Notes (Signed)
To ED via POV intermittent SOB over the last week and indigestion worsening over the weekend. Pt does not appear SOB while at rest. Pt alert and oriented X4, cooperative, RR even and unlabored, color WNL. Pt in NAD.

## 2021-10-09 NOTE — ED Provider Notes (Signed)
[facility] Provider Note    None    (approximate)   History   Chief Complaint Abdominal Pain and Shortness of Breath   HPI Justin Hammond is a 56 y.o. male, history of NSTEMI, type 2 diabetes, hyperlipidemia, presents to the emergency department for evaluation of shortness of breath and abdominal pain.  Reports intermittent shortness of breath with exertion for the past week, as well as indigestion that is worsened over the weekend.  Patient is currently asymptomatic at this time.  Denies fever/chills, chest pain, flank pain, nausea/vomiting, diarrhea, hematemesis, hematochezia, vision changes, hearing changes, or rash/lesions.  History Limitations: No limitations.        Physical Exam  Triage Vital Signs: ED Triage Vitals  Enc Vitals Group     BP 10/09/21 1308 118/74     Pulse Rate 10/09/21 1308 (!) 56     Resp 10/09/21 1308 18     Temp 10/09/21 1308 98.5 F (36.9 C)     Temp Source 10/09/21 1308 Oral     SpO2 10/09/21 1308 97 %     Weight 10/09/21 1309 300 lb (136.1 kg)     Height 10/09/21 1309 '5\' 8"'$  (1.727 m)     Head Circumference --      Peak Flow --      Pain Score 10/09/21 1309 2     Pain Loc --      Pain Edu? --      Excl. in [place]? --     Most recent vital signs: Vitals:   10/09/21 1308 10/09/21 1649  BP: 118/74 136/81  Pulse: (!) 56 60  Resp: 18 18  Temp: 98.5 F (36.9 C)   SpO2: 97% 97%    General: Awake, NAD.  Skin: Warm, dry. No rashes or lesions.  Eyes: PERRL. Conjunctivae normal.  CV: Good peripheral perfusion.  Resp: Normal effort.  Lung sounds are clear bilaterally in the apices and bases. Abd: Soft, non-tender. No distention.  Neuro: At baseline. No gross neurological deficits.   Focused Exam: N/A.  Physical Exam    ED Results / Procedures / Treatments  Labs (all labs ordered are listed, but only abnormal results are displayed) Labs Reviewed  BASIC METABOLIC PANEL - Abnormal; Notable for the following  components:      Result Value   Glucose, Bld 122 (*)    All other components within normal limits  D-DIMER, QUANTITATIVE - Abnormal; Notable for the following components:   D-Dimer, Quant 0.61 (*)    All other components within normal limits  RESP PANEL BY RT-PCR (FLU A&B, COVID) ARPGX2  CBC  HEPATIC FUNCTION PANEL  LIPASE, BLOOD  BRAIN NATRIURETIC PEPTIDE  TROPONIN I (HIGH SENSITIVITY)  TROPONIN I (HIGH SENSITIVITY)     EKG Sinus bradycardia with first-degree AV block, rate of 56, no ST segment changes, normal QRS interval,    RADIOLOGY  ED Provider Interpretation: I personally viewed and interpreted these images.  Chest x-ray shows no evidence of acute cardiopulmonary disease.  Ultrasound shows evidence of cholecystolithiasis.  No evidence of cholecystitis at this time.  DG Chest 2 View  Result Date: 10/09/2021 CLINICAL DATA:  epigastric pain EXAM: CHEST - 2 VIEW COMPARISON:  Chest radiograph 12/27/2016. FINDINGS: No consolidation. No visible pleural effusions or pneumothorax. Cardiomediastinal silhouette is within normal limits. No displaced fracture. Degenerative changes of the visualized spine. IMPRESSION: No evidence of acute cardiopulmonary disease. Electronically Signed   By: Margaretha Sheffield M.D.   On: 10/09/2021  13:28   US Abdomen Limited RUQ (LIVER/GB)  Result Date: 10/09/2021 CLINICAL DATA:  Provided history: Pain, indigestion for 2 days. EXAM: ULTRASOUND ABDOMEN LIMITED RIGHT UPPER QUADRANT COMPARISON:  Report from abdominal ultrasound FINDINGS: Gallbladder: Cholecystolithiasis. No gallbladder wall thickening or appreciable pericholecystic fluid. No sonographic Percell Miller sign is elicited by the scanning technologist. Common bile duct: Diameter: 4 mm, within normal limits. Liver: Increased hepatic parenchymal echogenicity. 1.7 x 1.2 x 1.5 cm focus of relative hypoechogenicity along the gallbladder fossa, likely reflecting focal fatty sparing. Portal vein is patent on color  Doppler imaging with normal direction of blood flow towards the liver. IMPRESSION: Cholecystolithiasis without sonographic evidence of acute cholecystitis. Hyperechogenicity of the hepatic parenchyma. Although nonspecific, this finding is most often secondary to hepatic steatosis. 1.7 x 1.5 cm focus of relative hypoechogenicity along the gallbladder fossa, likely reflecting focal fatty sparing. Electronically Signed   By: Kellie Simmering D.O.   On: 10/09/2021 14:10    PROCEDURES:  Critical Care performed: N/A.  Procedures    MEDICATIONS ORDERED IN ED: Medications  alum & mag hydroxide-simeth (MAALOX/MYLANTA) 200-200-20 MG/5ML suspension 30 mL (30 mLs Oral Given 10/09/21 1738)    And  lidocaine (XYLOCAINE) 2 % viscous mouth solution 15 mL (15 mLs Oral Given 10/09/21 1738)     IMPRESSION / MDM / ASSESSMENT AND PLAN / ED COURSE  I reviewed the triage vital signs and the nursing notes.                              Differential diagnosis includes, but is not limited to, cholecystitis, cholelithiasis, COVID-19, influenza, ACS, CHF, PE, GERD  ED Course Patient appears well, vitals within normal limits.  NAD.  CBC shows no leukocytosis or anemia.  BMP shows no electrolyte abnormalities or AKI.  Hepatic function panel shows no transaminitis or hypoalbuminemia.  BMP unremarkable at 21.0, unlikely heart failure.  Initial troponin 5, second troponin 5.  Unlikely ACS or myocarditis.  D-dimer 0.61.  Negative for thromboembolism based on age-adjusted cut off.  Respiratory panel negative for COVID-19 or influenza.  Assessment/Plan Patient presents with intermittent shortness of breath and epigastric discomfort.  Lab work has been reassuring.  Chest x-ray shows no acute cardiopulmonary abnormalities.  EKG does show first-degree AV block, consistent with his last EKG in March.  But otherwise unremarkable.  Ultrasound shows cholecystolithiasis, otherwise no evidence of cholecystitis.  Unlikely PE  given age-adjusted D-dimer.  Unclear of the patient's source of symptoms at this time, though very unlikely to have serious or life-threatening pathology based on his presentation and work-up thus far.  Treat his symptoms here with GI cocktail.  We will provide him with a prescription for omeprazole and a referral to general surgery. Additionally recommended they follow-up with his primary care provider and cardiologist as needed.  Considered admission for this patient, but given his stable presentation, unremarkable work-up, and reliable follow-up, he is unlikely to benefit.  Provided the patient with anticipatory guidance, return precautions, and educational material. Encouraged the patient to return to the emergency department at any time if they begin to experience any new or worsening symptoms. Patient expressed understanding and agreed with the plan.       FINAL CLINICAL IMPRESSION(S) / ED DIAGNOSES   Final diagnoses:  Dyspnea, unspecified type  Calculus of gallbladder with cholecystitis without biliary obstruction, unspecified cholecystitis acuity     Rx / DC Orders   ED Discharge Orders  Ordered    omeprazole (PRILOSEC OTC) 20 MG tablet  Daily        10/09/21 1913             Note:  This document was prepared using Dragon voice recognition software and may include unintentional dictation errors.   Teodoro Spray, [place] 10/09/21 Felicita Gage    Naaman Plummer, MD 10/10/21 [phone]

## 2021-10-09 NOTE — ED Provider Triage Note (Signed)
Emergency Medicine Provider Triage Evaluation Note  Justin Hammond , a 56 y.o. male  was evaluated in triage.  Pt complains of indigestion over the weekend.  Denies actual chest pain.  States that burping alleviates it a little but not much.  So some shortness of breath.  Review of Systems  Positive: Shortness of breath, indigestion, right upper quadrant pain Negative: Fever, chills  Physical Exam  BP 118/74   Pulse (!) 56   Temp 98.5 F (36.9 C) (Oral)   Resp 18   Ht '5\' 8"'$  (1.727 m)   Wt 136.1 kg   SpO2 97%   BMI 45.61 kg/m  Gen:   Awake, no distress   Resp:  Normal effort  MSK:   Moves extremities without difficulty  Other:  Right upper quadrant mildly tender  Medical Decision Making  Medically screening exam initiated at 1:09 PM.  Appropriate orders placed.  Justin Hammond was informed that the remainder of the evaluation will be completed by another provider, this initial triage assessment does not replace that evaluation, and the importance of remaining in the ED until their evaluation is complete.  Cardiac protocols initiated, also right upper quadrant ultrasound due to indigestion   Justin Starks, PA-C 10/09/21 1310

## 2021-10-10 ENCOUNTER — Telehealth: Payer: Self-pay | Admitting: Emergency Medicine

## 2021-10-10 MED ORDER — OMEPRAZOLE MAGNESIUM 20 MG PO TBEC: 20.0000 mg | DELAYED_RELEASE_TABLET | Freq: Every day | ORAL | 1 refills | Status: DC

## 2021-10-10 NOTE — Telephone Encounter (Signed)
Original prescription not present

## 2022-04-24 ENCOUNTER — Ambulatory Visit: Payer: BC Managed Care – PPO | Admitting: Podiatry

## 2022-04-24 ENCOUNTER — Encounter: Payer: Self-pay | Admitting: Podiatry

## 2022-04-24 VITALS — BP 119/48 | HR 63

## 2022-04-24 DIAGNOSIS — B351 Tinea unguium: Secondary | ICD-10-CM | POA: Diagnosis not present

## 2022-04-24 DIAGNOSIS — M79674 Pain in right toe(s): Secondary | ICD-10-CM | POA: Diagnosis not present

## 2022-04-24 DIAGNOSIS — M79675 Pain in left toe(s): Secondary | ICD-10-CM | POA: Diagnosis not present

## 2022-04-24 NOTE — Progress Notes (Signed)
   Chief Complaint  Patient presents with   Nail Problem    "My toenails have turned black and one has fallen off." N - toenails black L - bilateral foot D - 1 month or more O - suddenly C - black, nail fell off, right big toe gets sore A - none T - soaking every once in a while in epsom salt   Foot Orthotics    "I want to see about getting some new orthotics, if my insurance will let me."    SUBJECTIVE Patient with a history of diabetes mellitus presents to office today complaining of elongated, thickened nails that cause pain while ambulating in shoes.  Patient is unable to trim their own nails.  Patient is also concerned because of loss of the toenail to the left hallux nail plate.  Idiopathic.  He denies a history of injury.  He says that just recently the toenail fell off.  Patient is here for further evaluation and treatment.  Past Medical History:  Diagnosis Date   Cancer [facility])    bladder cancer   Diabetes mellitus without complication (HCC)    Hyperlipidemia    Proteinuria     Allergies  Allergen Reactions   Penicillins Itching    Has patient had a PCN reaction causing immediate rash, facial/tongue/throat swelling, SOB or lightheadedness with hypotension: No Has patient had a PCN reaction causing severe rash involving mucus membranes or skin necrosis: No Has patient had a PCN reaction that required hospitalization: No Has patient had a PCN reaction occurring within the last 10 years: No If all of the above answers are "NO", then may proceed with Cephalosporin use.      OBJECTIVE General Patient is awake, alert, and oriented x 3 and in no acute distress. Derm Skin is dry and supple bilateral. Negative open lesions or macerations. Remaining integument unremarkable. Nails are tender, long, thickened and dystrophic with subungual debris, consistent with onychomycosis, 1-5 bilateral. No signs of infection noted.  Loss of the left hallux nail plate noted.  Underlying  nailbed appears stable and healing appropriately.  Clinically no indication of infection Vasc  DP and PT pedal pulses palpable bilaterally. Temperature gradient within normal limits.  Neuro light touch and protective threshold sensation diminished bilaterally.  Musculoskeletal Exam No symptomatic pedal deformities noted bilateral. Muscular strength within normal limits.  ASSESSMENT 1. Diabetes Mellitus w/ peripheral neuropathy 2.  Pain due to onychomycosis of toenails bilateral  PLAN OF CARE 1. Patient evaluated today. 2. Instructed to maintain good pedal hygiene and foot care. Stressed importance of controlling blood sugar.  3. Mechanical debridement of nails 1-5 bilaterally performed using a nail nipper. Filed with dremel without incident.  4. Return to clinic in 3 mos.     Edrick Kins, DPM [facility]  Dr. Edrick Kins, DPM    [address], [place] [address]                Office [phone]  Fax [phone]

## 2022-07-11 ENCOUNTER — Encounter: Payer: Self-pay | Admitting: Urology

## 2022-07-11 ENCOUNTER — Ambulatory Visit: Payer: BC Managed Care – PPO | Admitting: Urology

## 2022-07-11 VITALS — BP 144/68 | HR 71 | Ht 68.0 in | Wt 298.0 lb

## 2022-07-11 DIAGNOSIS — R339 Retention of urine, unspecified: Secondary | ICD-10-CM

## 2022-07-11 DIAGNOSIS — N401 Enlarged prostate with lower urinary tract symptoms: Secondary | ICD-10-CM

## 2022-07-11 DIAGNOSIS — N400 Enlarged prostate without lower urinary tract symptoms: Secondary | ICD-10-CM

## 2022-07-11 LAB — BLADDER SCAN AMB NON-IMAGING: Scan Result: 304

## 2022-07-11 MED ORDER — DUTASTERIDE 0.5 MG PO CAPS: 0.5000 mg | ORAL_CAPSULE | Freq: Every day | ORAL | 3 refills | Status: DC

## 2022-07-11 MED ORDER — GEMTESA 75 MG PO TABS: 75.0000 mg | ORAL_TABLET | Freq: Every day | ORAL | 0 refills | Status: DC

## 2022-07-11 MED ORDER — TAMSULOSIN HCL 0.4 MG PO CAPS: 0.4000 mg | ORAL_CAPSULE | Freq: Every day | ORAL | 3 refills | Status: DC

## 2022-07-11 NOTE — Progress Notes (Signed)
07/11/2022 2:48 PM   Justin Hammond May 29, 1965 [id number]  Referring provider: Albina Billet, MD [address]   [place],  [place] [address]  Chief Complaint  Patient presents with   New Patient (Initial Visit)    Urologic history: 1.  BPH with LUTS TUMT 2009; 2018 Combination therapy tamsulosin/dutasteride  2.  History urothelial carcinoma bladder TURBT 2007; path Ta low-grade; no recurrences   HPI: Justin Hammond is a 57 y.o. male presents for transfer of urologic care with a recent retirement of Dr. Yves Dill.  States voiding symptoms have never really improved with previous TUMT and medical management Complains of sensation of incomplete emptying, frequency, urgency, straining to urinate and nocturia x 4 IPSS 28/35 Denies dysuria, gross hematuria No flank, abdominal or pelvic pain Remains on tamsulosin/dutasteride.  He ran out of one of the medications 1 month ago but is not sure which 1   PMH: Past Medical History:  Diagnosis Date   Cancer ([place])    bladder cancer   Diabetes mellitus without complication ([place])    Hyperlipidemia    Proteinuria     Surgical History: Past Surgical History:  Procedure Laterality Date   APPENDECTOMY     CORONARY STENT INTERVENTION N/A 12/28/2016   Procedure: CORONARY STENT INTERVENTION;  Surgeon: Teodoro Spray, MD;  Location: [facility];  Service: Cardiovascular;  Laterality: N/A;   CORONARY STENT INTERVENTION N/A 12/28/2016   Procedure: CORONARY STENT INTERVENTION;  Surgeon: Isaias Cowman, MD;  Location: [facility];  Service: Cardiovascular;  Laterality: N/A;   LEFT HEART CATH AND CORONARY ANGIOGRAPHY N/A 12/28/2016   Procedure: LEFT HEART CATH AND CORONARY ANGIOGRAPHY;  Surgeon: Teodoro Spray, MD;  Location: [facility];  Service: Cardiovascular;  Laterality: N/A;    Home Medications:  Allergies as of 07/11/2022       Reactions   Penicillins Itching   Has patient had a PCN  reaction causing immediate rash, facial/tongue/throat swelling, SOB or lightheadedness with hypotension: No Has patient had a PCN reaction causing severe rash involving mucus membranes or skin necrosis: No Has patient had a PCN reaction that required hospitalization: No Has patient had a PCN reaction occurring within the last 10 years: No If all of the above answers are "NO", then may proceed with Cephalosporin use.        Medication List        Accurate as of July 11, 2022  2:48 PM. If you have any questions, ask your nurse or doctor.          STOP taking these medications    Accu-Chek Aviva Plus test strip Generic drug: glucose blood Stopped by: Abbie Sons, MD   Accu-Chek Softclix Lancets lancets Stopped by: Abbie Sons, MD   aspirin 81 MG chewable tablet Stopped by: Abbie Sons, MD   blood glucose meter kit and supplies Kit Stopped by: Abbie Sons, MD   clopidogrel 75 MG tablet Commonly known as: PLAVIX Stopped by: Abbie Sons, MD   doxycycline 100 MG tablet Commonly known as: VIBRA-TABS Stopped by: Abbie Sons, MD   HYDROCORTISONE ACE (RECTAL) 30 MG Supp Stopped by: Abbie Sons, MD   insulin glargine 100 UNIT/ML injection Commonly known as: LANTUS Stopped by: Abbie Sons, MD   mupirocin ointment 2 % Commonly known as: BACTROBAN Stopped by: Abbie Sons, MD       TAKE these medications    atorvastatin 40  MG tablet Commonly known as: LIPITOR Take 1 tablet (40 mg total) by mouth daily.   carvedilol 3.125 MG tablet Commonly known as: COREG Take 1 tablet (3.125 mg total) by mouth 2 (two) times daily with a meal.   dutasteride 0.5 MG capsule Commonly known as: AVODART Take 1 capsule (0.5 mg total) by mouth daily.   gabapentin 400 MG capsule Commonly known as: NEURONTIN Take 1 capsule by mouth 3 (three) times daily.   Gemtesa 75 MG Tabs Generic drug: Vibegron Take 1 tablet (75 mg total) by mouth  daily. Started by: Abbie Sons, MD   glimepiride 4 MG tablet Commonly known as: AMARYL Take 1 tablet by mouth daily.   Jardiance 25 MG Tabs tablet Generic drug: empagliflozin Take 25 mg by mouth every morning.   losartan 100 MG tablet Commonly known as: COZAAR Take 1 tablet by mouth daily.   metFORMIN 500 MG tablet Commonly known as: GLUCOPHAGE Take 500 mg by mouth 2 (two) times daily.   omeprazole 20 MG tablet Commonly known as: PriLOSEC OTC Take 2 tablets (40 mg total) by mouth daily.   omeprazole 20 MG tablet Commonly known as: PriLOSEC OTC Take 1 tablet (20 mg total) by mouth daily.   Ozempic (1 MG/DOSE) 4 MG/3ML Sopn Generic drug: Semaglutide (1 MG/DOSE) Inject 1 mg into the skin.   tamsulosin 0.4 MG Caps capsule Commonly known as: FLOMAX Take 1 capsule (0.4 mg total) by mouth daily.        Allergies:  Allergies  Allergen Reactions   Penicillins Itching    Has patient had a PCN reaction causing immediate rash, facial/tongue/throat swelling, SOB or lightheadedness with hypotension: No Has patient had a PCN reaction causing severe rash involving mucus membranes or skin necrosis: No Has patient had a PCN reaction that required hospitalization: No Has patient had a PCN reaction occurring within the last 10 years: No If all of the above answers are "NO", then may proceed with Cephalosporin use.     Family History: Family History  Problem Relation Age of Onset   Breast cancer Mother    CAD Father     Social History:  reports that he has never smoked. He has never used smokeless tobacco. He reports that he does not drink alcohol and does not use drugs.   Physical Exam: BP (!) 144/68   Pulse 71   Ht 5' 8"$  (1.727 m)   Wt 298 lb (135.2 kg)   BMI 45.31 kg/m   Constitutional:  Alert, No acute distress. HEENT: [place] AT Respiratory: Normal respiratory effort, no increased work of breathing. Psychiatric: Normal mood and affect.   Assessment & Plan:     1.  BPH with LUTS Severe LUTS which have worsened Today 304 mL Tamsulosin/dutasteride refilled; follow-up PVR 4-6 weeks.  Cystoscopy if still elevated Add Gemtesa for storage related voiding symptoms  2.  Incomplete bladder emptying As above   Abbie Sons, [place] [address], [place], [place] [address] [phone]

## 2022-08-22 ENCOUNTER — Ambulatory Visit: Payer: BC Managed Care – PPO | Admitting: Physician Assistant

## 2022-08-22 ENCOUNTER — Encounter: Payer: Self-pay | Admitting: Physician Assistant

## 2022-08-22 VITALS — BP 133/77 | HR 64 | Ht 68.0 in | Wt 300.0 lb

## 2022-08-22 DIAGNOSIS — N401 Enlarged prostate with lower urinary tract symptoms: Secondary | ICD-10-CM

## 2022-08-22 DIAGNOSIS — R350 Frequency of micturition: Secondary | ICD-10-CM

## 2022-08-22 DIAGNOSIS — N400 Enlarged prostate without lower urinary tract symptoms: Secondary | ICD-10-CM

## 2022-08-22 LAB — BLADDER SCAN AMB NON-IMAGING: Scan Result: 177

## 2022-08-22 NOTE — Progress Notes (Addendum)
08/22/2022 3:17 PM   Justin Hammond 28-Feb-1966 [id number]  CC: Chief Complaint  Patient presents with   Benign Prostatic Hypertrophy   HPI: Justin Hammond is a 57 y.o. 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   Today he reports his urinary symptoms are rather stable on triple pharmacotherapy.  He admits to some possible slight improvement in frequency, but overall he remains rather bothered by his symptoms.  IPSS 26/terrible as below, previously 28. PVR 117mL.   IPSS     Row Name 08/22/22 1500         International Prostate Symptom Score   How often have you had the sensation of not emptying your bladder? Almost always     How often have you had to urinate less than every two hours? Almost always     How often have you found you stopped and started again several times when you urinated? Less than half the time     How often have you found it difficult to postpone urination? About half the time     How often have you had a weak urinary stream? About half the time     How often have you had to strain to start urination? More than half the time     How many times did you typically get up at night to urinate? 4 Times     Total IPSS Score 26       Quality of Life due to urinary symptoms   If you were to spend the rest of your life with your urinary condition just the way it is now how would you feel about that? Terrible               PMH: Past Medical History:  Diagnosis Date   Cancer ([place])    bladder cancer   Diabetes mellitus without complication ([place])    Hyperlipidemia    Proteinuria     Surgical History: Past Surgical History:  Procedure Laterality Date   APPENDECTOMY     CORONARY STENT INTERVENTION N/A 12/28/2016   Procedure:  CORONARY STENT INTERVENTION;  Surgeon: Teodoro Spray, MD;  Location: [facility];  Service: Cardiovascular;  Laterality: N/A;   CORONARY STENT INTERVENTION N/A 12/28/2016   Procedure: CORONARY STENT INTERVENTION;  Surgeon: Isaias Cowman, MD;  Location: [facility];  Service: Cardiovascular;  Laterality: N/A;   LEFT HEART CATH AND CORONARY ANGIOGRAPHY N/A 12/28/2016   Procedure: LEFT HEART CATH AND CORONARY ANGIOGRAPHY;  Surgeon: Teodoro Spray, MD;  Location: [facility];  Service: Cardiovascular;  Laterality: N/A;    Home Medications:  Allergies as of 08/22/2022       Reactions   Penicillins Itching   Has patient had a PCN reaction causing immediate rash, facial/tongue/throat swelling, SOB or lightheadedness with hypotension: No Has patient had a PCN reaction causing severe rash involving mucus membranes or skin necrosis: No Has patient had a PCN reaction that required hospitalization: No Has patient had a PCN reaction occurring within the last 10 years: No If all of the above answers are "NO", then may proceed with Cephalosporin use.        Medication List        Accurate as of August 22, 2022  3:17  PM. If you have any questions, ask your nurse or doctor.          atorvastatin 40 MG tablet Commonly known as: LIPITOR Take 1 tablet (40 mg total) by mouth daily.   carvedilol 3.125 MG tablet Commonly known as: COREG Take 1 tablet (3.125 mg total) by mouth 2 (two) times daily with a meal.   dutasteride 0.5 MG capsule Commonly known as: AVODART Take 1 capsule (0.5 mg total) by mouth daily.   gabapentin 400 MG capsule Commonly known as: NEURONTIN Take 1 capsule by mouth 3 (three) times daily.   Gemtesa 75 MG Tabs Generic drug: Vibegron Take 1 tablet (75 mg total) by mouth daily.   glimepiride 4 MG tablet Commonly known as: AMARYL Take 1 tablet by mouth daily.   Jardiance 25 MG Tabs tablet Generic drug: empagliflozin Take 25 mg by  mouth every morning.   losartan 100 MG tablet Commonly known as: COZAAR Take 1 tablet by mouth daily.   metFORMIN 500 MG tablet Commonly known as: GLUCOPHAGE Take 500 mg by mouth 2 (two) times daily.   omeprazole 20 MG tablet Commonly known as: PriLOSEC OTC Take 2 tablets (40 mg total) by mouth daily.   omeprazole 20 MG tablet Commonly known as: PriLOSEC OTC Take 1 tablet (20 mg total) by mouth daily.   Ozempic (1 MG/DOSE) 4 MG/3ML Sopn Generic drug: Semaglutide (1 MG/DOSE) Inject 1 mg into the skin.   tamsulosin 0.4 MG Caps capsule Commonly known as: FLOMAX Take 1 capsule (0.4 mg total) by mouth daily.        Allergies:  Allergies  Allergen Reactions   Penicillins Itching    Has patient had a PCN reaction causing immediate rash, facial/tongue/throat swelling, SOB or lightheadedness with hypotension: No Has patient had a PCN reaction causing severe rash involving mucus membranes or skin necrosis: No Has patient had a PCN reaction that required hospitalization: No Has patient had a PCN reaction occurring within the last 10 years: No If all of the above answers are "NO", then may proceed with Cephalosporin use.     Family History: Family History  Problem Relation Age of Onset   Breast cancer Mother    CAD Father     Social History:   reports that he has never smoked. He has never used smokeless tobacco. He reports that he does not drink alcohol and does not use drugs.  Physical Exam: BP 133/77   Pulse 64   Ht 5\' 8"  (1.727 m)   Wt 300 lb (136.1 kg)   BMI 45.61 kg/m   Constitutional:  Alert and oriented, no acute distress, nontoxic appearing HEENT: [place], AT Cardiovascular: No clubbing, cyanosis, or edema Respiratory: Normal respiratory effort, no increased work of breathing Skin: No rashes, bruises or suspicious lesions Neurologic: Grossly intact, no focal deficits, moving all 4 extremities Psychiatric: Normal mood and affect  Laboratory Data: Results for  orders placed or performed in visit on 08/22/22  Bladder Scan (Post Void Residual) in office  Result Value Ref Range   Scan Result 177    Assessment & Plan:   1. Benign prostatic hyperplasia with urinary frequency No significant change in urinary symptoms on Flomax, dutasteride, and Gemtesa.  PVR improved over prior, but still elevated.  Will pursue cystoscopy with Dr. Bernardo Heater for further evaluation.  He is in agreement with this plan. - Bladder Scan (Post Void Residual) in office  Return in about 2 weeks (around 09/05/2022) for Cysto with Dr. Bernardo Heater.  Aldona Bar  Clovis Pu  [facility] [place] [address], [place], [place] [address] [phone]

## 2022-09-19 ENCOUNTER — Ambulatory Visit: Payer: BC Managed Care – PPO | Admitting: Urology

## 2022-09-19 VITALS — BP 135/70 | HR 72 | Ht 68.0 in | Wt 300.0 lb

## 2022-09-19 DIAGNOSIS — R3914 Feeling of incomplete bladder emptying: Secondary | ICD-10-CM | POA: Diagnosis not present

## 2022-09-19 DIAGNOSIS — N401 Enlarged prostate with lower urinary tract symptoms: Secondary | ICD-10-CM | POA: Diagnosis not present

## 2022-09-19 LAB — MICROSCOPIC EXAMINATION: Bacteria, UA: NONE SEEN

## 2022-09-19 LAB — URINALYSIS, COMPLETE
Bilirubin, UA: NEGATIVE
Ketones, UA: NEGATIVE
Leukocytes,UA: NEGATIVE
Nitrite, UA: NEGATIVE
RBC, UA: NEGATIVE
Specific Gravity, UA: 1.01 (ref 1.005–1.030)
Urobilinogen, Ur: 0.2 mg/dL (ref 0.2–1.0)
pH, UA: 5.5 (ref 5.0–7.5)

## 2022-09-19 NOTE — Progress Notes (Unsigned)
   09/19/22  CC:  Chief Complaint  Patient presents with   Cysto    HPI: Refer to my previous note 07/11/2022.  Severe lower urinary tract symptoms and elevated residual 304 mL.  Follow-up residual on PA visit last month was 177 mL with unchanged IPSS  Blood pressure 135/70, pulse 72, height 5\' 8"  (1.727 m), weight 300 lb (136.1 kg). NED. A&Ox3.   No respiratory distress   Abd soft, NT, ND Normal phallus with bilateral descended testicles  Cystoscopy Procedure Note  Patient identification was confirmed, informed consent was obtained, and patient was prepped using Betadine solution.  Lidocaine jelly was administered per urethral meatus.     Pre-Procedure: - Inspection reveals a normal caliber urethral meatus.  Procedure: The flexible cystoscope was introduced without difficulty - No urethral strictures/lesions are present. -  Prominent lateral lobe enlargement  prostate  - Elevated bladder neck - Bilateral ureteral orifices identified - Bladder mucosa  reveals no ulcers, tumors, or lesions - No bladder stones - No trabeculation  Retroflexion shows no intravesical median lobe   Post-Procedure: - Patient tolerated the procedure well  Assessment/ Plan: Severe lower urinary tract symptoms with elevated residual Has been on combination therapy several years Based on prostate size in my opinion HoLEP would be best outlet procedure He was interested in an appointment with Dr. Apolinar Junes or Dr. Richardo Hanks to discuss further    Riki Altes, MD

## 2022-09-19 NOTE — Patient Instructions (Signed)

## 2022-10-24 ENCOUNTER — Ambulatory Visit: Payer: BC Managed Care – PPO | Admitting: Urology

## 2022-11-01 ENCOUNTER — Ambulatory Visit: Payer: BC Managed Care – PPO | Admitting: Urology

## 2022-11-19 ENCOUNTER — Other Ambulatory Visit: Payer: Self-pay | Admitting: Urology

## 2022-11-19 ENCOUNTER — Ambulatory Visit: Payer: BC Managed Care – PPO | Admitting: Urology

## 2022-11-19 ENCOUNTER — Encounter: Payer: Self-pay | Admitting: Urology

## 2022-11-19 VITALS — BP 130/70 | HR 64 | Ht 68.0 in | Wt 300.0 lb

## 2022-11-19 DIAGNOSIS — N401 Enlarged prostate with lower urinary tract symptoms: Secondary | ICD-10-CM

## 2022-11-19 DIAGNOSIS — R339 Retention of urine, unspecified: Secondary | ICD-10-CM

## 2022-11-19 DIAGNOSIS — N138 Other obstructive and reflux uropathy: Secondary | ICD-10-CM

## 2022-11-19 NOTE — Progress Notes (Signed)
Surgical Physician Order Form [facility] [place]  * Scheduling expectation :  Patient prefers November  *Length of Case: 2 hours  *Clearance needed: no  *Anticoagulation Instructions: Hold all anticoagulants  *Aspirin Instructions: Hold Aspirin  *Post-op visit Date/Instructions:  1-3 day cath removal  *Diagnosis: BPH w/urinary obstruction  *Procedure:     HOLEP (16109)   Additional orders: N/A  -Admit type: OUTpatient  -Anesthesia: General  -VTE Prophylaxis Standing Order SCD's       Other:   -Standing Lab Orders Per Anesthesia    Lab other: UA&Urine Culture  -Standing Test orders EKG/Chest x-ray per Anesthesia       Test other:   - Medications:  Cipro 400mg  IV  -Other orders:  N/A

## 2022-11-19 NOTE — Progress Notes (Signed)
   11/19/2022 2:49 PM   Justin Hammond [birth year] [id number]  Reason for visit: BPH and incomplete bladder emptying, discuss HOLEP  HPI: Comorbid 57 year old male previously followed by Dr. Sheppard Penton as well as Dr. Lonna Cobb who was referred for consideration of HOLEP.  He has a number of comorbidities including morbid obesity with BMI of 46, diabetes(no HgA1c to review but 3+ glucose on recent UA).  He has bothersome urinary symptoms of incomplete bladder emptying, urgency, frequency, straining, nocturia 3-4 times overnight.  He underwent 2 prior TUMT procedures in 2009 and 2018 with Dr. Sheppard Penton with minimal to no improvement.  Recent cystoscopy with Dr. Lonna Cobb showed significant lateral lobe enlargement of the prostate with elevated bladder neck, no suspicious bladder lesions, no median lobe on retroflexion.  No volume available.  He is on a number of medications for his urinary symptoms including dutasteride, Flomax, and Gemtesa.  He has had a number of elevated PVRs as high as with documented incomplete bladder emptying.  We reviewed that urinary symptoms can be multifactorial, especially with his other comorbidities including morbid obesity, diabetes with glucosuria, and potential sleep apnea contributing to nocturia.  In the setting of his documented incomplete bladder emptying, I do think it is reasonable to pursue an outlet procedure, but this may not completely resolve his urinary symptoms.  We discussed the risks and benefits of HoLEP at length.  The procedure requires general anesthesia and takes 1 to 2 hours, and a holmium laser is used to enucleate the prostate and push this tissue into the bladder.  A morcellator is then used to remove this tissue, which is sent for pathology.  The vast majority(>95%) of patients are able to discharge the same day with a catheter in place for 2 to 3 days, and will follow-up in clinic for a voiding trial.  We specifically discussed the risks of bleeding,  infection, retrograde ejaculation, temporary urgency and urge incontinence, very low risk of long-term incontinence, urethral stricture/bladder neck contracture, pathologic evaluation of prostate tissue and possible detection of prostate cancer or other malignancy, and possible need for additional procedures.  Schedule HOLEP, patient may want to wait until fall 2024  Sondra Come, MD  [facility] [address], [address] [place], [place] [address] [phone]

## 2022-11-19 NOTE — Patient Instructions (Signed)

## 2022-11-23 NOTE — Progress Notes (Signed)
    [facility] Posting Form  Surgery Date: Date: 03/29/2023  Surgeon: Dr. Legrand Rams, MD  Inpt ( No  )   Outpt (Yes)   Obs ( No  )   Diagnosis: N40.1, N13.8 Benign Prostatic Hyperplasia with Urinary Obstruction  -CPT: 16109  Surgery: Holmium Laser Enucleation of the Prostate  Stop Anticoagulations: Yes and also hold ASA  Cardiac/Medical/Pulmonary Clearance needed: no  *Orders entered into EPIC  Date: 11/23/22   *Case booked in EPIC  Date: 11/23/22  *Notified pt of Surgery: Date: 11/23/22  PRE-OP UA & CX: yes, will obtain in clinic on 03/18/2023  *Placed into Prior Authorization Work Angela Nevin Date: 11/23/22  Assistant/laser/rep:No  Patient advised to hold Ozempic one week prior to surgery.

## 2022-12-04 ENCOUNTER — Telehealth: Payer: Self-pay

## 2022-12-04 NOTE — Telephone Encounter (Signed)
I spoke with Mr. Justin Hammond. We have discussed possible surgery dates and Friday November 8th, 2024 was agreed upon by all parties. Patient given information about surgery date, what to expect pre-operatively and post operatively.  We discussed that a Pre-Admission Testing office will be calling to set up the pre-op visit that will take place prior to surgery, and that these appointments are typically done over the phone with a Pre-Admissions RN. Informed patient that our office will communicate any additional care to be provided after surgery. Patients questions or concerns were discussed during our call. Advised to call our office should there be any additional information, questions or concerns that arise. Patient verbalized understanding.

## 2022-12-06 ENCOUNTER — Other Ambulatory Visit: Payer: Self-pay

## 2022-12-06 ENCOUNTER — Inpatient Hospital Stay
Admission: EM | Admit: 2022-12-06 | Discharge: 2022-12-10 | DRG: 372 | Disposition: A | Discharge status: Home / Self Care | Payer: BC Managed Care – PPO | Attending: Internal Medicine | Admitting: Internal Medicine

## 2022-12-06 ENCOUNTER — Emergency Department: Payer: BC Managed Care – PPO

## 2022-12-06 DIAGNOSIS — R7989 Other specified abnormal findings of blood chemistry: Secondary | ICD-10-CM | POA: Insufficient documentation

## 2022-12-06 DIAGNOSIS — A045 Campylobacter enteritis: Principal | ICD-10-CM

## 2022-12-06 DIAGNOSIS — N400 Enlarged prostate without lower urinary tract symptoms: Secondary | ICD-10-CM | POA: Insufficient documentation

## 2022-12-06 DIAGNOSIS — R509 Fever, unspecified: Principal | ICD-10-CM

## 2022-12-06 DIAGNOSIS — Z8249 Family history of ischemic heart disease and other diseases of the circulatory system: Secondary | ICD-10-CM

## 2022-12-06 DIAGNOSIS — N401 Enlarged prostate with lower urinary tract symptoms: Secondary | ICD-10-CM | POA: Diagnosis present

## 2022-12-06 DIAGNOSIS — Z7984 Long term (current) use of oral hypoglycemic drugs: Secondary | ICD-10-CM

## 2022-12-06 DIAGNOSIS — E785 Hyperlipidemia, unspecified: Secondary | ICD-10-CM | POA: Diagnosis present

## 2022-12-06 DIAGNOSIS — I2510 Atherosclerotic heart disease of native coronary artery without angina pectoris: Secondary | ICD-10-CM | POA: Diagnosis present

## 2022-12-06 DIAGNOSIS — N138 Other obstructive and reflux uropathy: Secondary | ICD-10-CM | POA: Diagnosis present

## 2022-12-06 DIAGNOSIS — E871 Hypo-osmolality and hyponatremia: Secondary | ICD-10-CM | POA: Insufficient documentation

## 2022-12-06 DIAGNOSIS — R81 Glycosuria: Secondary | ICD-10-CM | POA: Diagnosis present

## 2022-12-06 DIAGNOSIS — Z880 Allergy status to penicillin: Secondary | ICD-10-CM

## 2022-12-06 DIAGNOSIS — A0811 Acute gastroenteropathy due to Norwalk agent: Secondary | ICD-10-CM

## 2022-12-06 DIAGNOSIS — E1169 Type 2 diabetes mellitus with other specified complication: Secondary | ICD-10-CM | POA: Insufficient documentation

## 2022-12-06 DIAGNOSIS — Z7985 Long-term (current) use of injectable non-insulin antidiabetic drugs: Secondary | ICD-10-CM

## 2022-12-06 DIAGNOSIS — R531 Weakness: Secondary | ICD-10-CM

## 2022-12-06 DIAGNOSIS — Z79899 Other long term (current) drug therapy: Secondary | ICD-10-CM

## 2022-12-06 DIAGNOSIS — K8020 Calculus of gallbladder without cholecystitis without obstruction: Secondary | ICD-10-CM | POA: Diagnosis present

## 2022-12-06 DIAGNOSIS — N2889 Other specified disorders of kidney and ureter: Secondary | ICD-10-CM

## 2022-12-06 DIAGNOSIS — R7881 Bacteremia: Secondary | ICD-10-CM

## 2022-12-06 DIAGNOSIS — I10 Essential (primary) hypertension: Secondary | ICD-10-CM | POA: Diagnosis present

## 2022-12-06 DIAGNOSIS — B9689 Other specified bacterial agents as the cause of diseases classified elsewhere: Secondary | ICD-10-CM | POA: Diagnosis present

## 2022-12-06 DIAGNOSIS — C679 Malignant neoplasm of bladder, unspecified: Secondary | ICD-10-CM | POA: Diagnosis present

## 2022-12-06 DIAGNOSIS — Z955 Presence of coronary angioplasty implant and graft: Secondary | ICD-10-CM

## 2022-12-06 DIAGNOSIS — Z1152 Encounter for screening for COVID-19: Secondary | ICD-10-CM

## 2022-12-06 DIAGNOSIS — E66813 Obesity, class 3: Secondary | ICD-10-CM

## 2022-12-06 DIAGNOSIS — Z6841 Body Mass Index (BMI) 40.0 and over, adult: Secondary | ICD-10-CM

## 2022-12-06 DIAGNOSIS — C642 Malignant neoplasm of left kidney, except renal pelvis: Secondary | ICD-10-CM | POA: Diagnosis present

## 2022-12-06 LAB — CBC WITH DIFFERENTIAL/PLATELET
Abs Immature Granulocytes: 0.06 10*3/uL (ref 0.00–0.07)
Basophils Absolute: 0.1 10*3/uL (ref 0.0–0.1)
Basophils Relative: 1 %
Eosinophils Absolute: 0 10*3/uL (ref 0.0–0.5)
Eosinophils Relative: 0 %
HCT: 42 % (ref 39.0–52.0)
Hemoglobin: 14 g/dL (ref 13.0–17.0)
Immature Granulocytes: 2 %
Lymphocytes Relative: 11 %
Lymphs Abs: 0.5 10*3/uL — ABNORMAL LOW (ref 0.7–4.0)
MCH: 28.2 pg (ref 26.0–34.0)
MCHC: 33.3 g/dL (ref 30.0–36.0)
MCV: 84.5 fL (ref 80.0–100.0)
Monocytes Absolute: 0.4 10*3/uL (ref 0.1–1.0)
Monocytes Relative: 9 %
Neutro Abs: 3.2 10*3/uL (ref 1.7–7.7)
Neutrophils Relative %: 77 %
Platelets: 199 10*3/uL (ref 150–400)
RBC: 4.97 MIL/uL (ref 4.22–5.81)
RDW: 15.2 % (ref 11.5–15.5)
WBC: 4.1 10*3/uL (ref 4.0–10.5)
nRBC: 0 % (ref 0.0–0.2)

## 2022-12-06 LAB — COMPREHENSIVE METABOLIC PANEL
ALT: 41 U/L (ref 0–44)
AST: 42 U/L — ABNORMAL HIGH (ref 15–41)
Albumin: 3.3 g/dL — ABNORMAL LOW (ref 3.5–5.0)
Alkaline Phosphatase: 90 U/L (ref 38–126)
Anion gap: 6 (ref 5–15)
BUN: 18 mg/dL (ref 6–20)
CO2: 21 mmol/L — ABNORMAL LOW (ref 22–32)
Calcium: 7.7 mg/dL — ABNORMAL LOW (ref 8.9–10.3)
Chloride: 104 mmol/L (ref 98–111)
Creatinine, Ser: 1.23 mg/dL (ref 0.61–1.24)
GFR, Estimated: >60 mL/min (ref >60)
Glucose, Bld: 125 mg/dL — ABNORMAL HIGH (ref 70–99)
Potassium: 3.6 mmol/L (ref 3.5–5.1)
Sodium: 131 mmol/L — ABNORMAL LOW (ref 135–145)
Total Bilirubin: 1.1 mg/dL (ref 0.3–1.2)
Total Protein: 6.5 g/dL (ref 6.5–8.1)

## 2022-12-06 LAB — LIPASE, BLOOD: Lipase: 107 U/L — ABNORMAL HIGH (ref 11–51)

## 2022-12-06 LAB — URINALYSIS, ROUTINE W REFLEX MICROSCOPIC
Bacteria, UA: NONE SEEN
Bilirubin Urine: NEGATIVE
Glucose, UA: >=500 mg/dL — AB
Hgb urine dipstick: NEGATIVE
Ketones, ur: NEGATIVE mg/dL
Leukocytes,Ua: NEGATIVE
Nitrite: NEGATIVE
Protein, ur: 30 mg/dL — AB
Specific Gravity, Urine: >1.046 — ABNORMAL HIGH (ref 1.005–1.030)
Squamous Epithelial / HPF: NONE SEEN /HPF (ref 0–5)
pH: 5 (ref 5.0–8.0)

## 2022-12-06 LAB — LACTIC ACID, PLASMA
Lactic Acid, Venous: 1.2 mmol/L (ref 0.5–1.9)
Lactic Acid, Venous: 0.9 mmol/L (ref 0.5–1.9)

## 2022-12-06 LAB — RESP PANEL BY RT-PCR (RSV, FLU A&B, COVID)  RVPGX2
Influenza A by PCR: NEGATIVE
Influenza B by PCR: NEGATIVE
Resp Syncytial Virus by PCR: NEGATIVE
SARS Coronavirus 2 by RT PCR: NEGATIVE

## 2022-12-06 MED ORDER — VANCOMYCIN HCL 2000 MG/400ML IV SOLN
2000.0000 mg | Freq: Once | INTRAVENOUS | Status: AC
  Administered 2022-12-06: 2000 mg via INTRAVENOUS
  Filled 2022-12-06: qty 400

## 2022-12-06 MED ORDER — HEPARIN SODIUM (PORCINE) 5000 UNIT/ML IJ SOLN
5000.0000 [IU] | Freq: Three times a day (TID) | INTRAMUSCULAR | Status: DC
  Administered 2022-12-07 – 2022-12-10 (×11): 5000 [IU] via SUBCUTANEOUS
  Filled 2022-12-06 (×12): qty 1

## 2022-12-06 MED ORDER — SODIUM CHLORIDE 0.9 % IV BOLUS
1000.0000 mL | Freq: Once | INTRAVENOUS | Status: AC
  Administered 2022-12-06: 1000 mL via INTRAVENOUS

## 2022-12-06 MED ORDER — ACETAMINOPHEN 500 MG PO TABS
1000.0000 mg | ORAL_TABLET | Freq: Once | ORAL | Status: AC
  Administered 2022-12-06: 1000 mg via ORAL
  Filled 2022-12-06: qty 2

## 2022-12-06 MED ORDER — SODIUM CHLORIDE 0.9 % IV SOLN: 2.0000 g | Freq: Once | INTRAVENOUS | Status: DC

## 2022-12-06 MED ORDER — GABAPENTIN 400 MG PO CAPS
400.0000 mg | ORAL_CAPSULE | Freq: Three times a day (TID) | ORAL | Status: DC
  Administered 2022-12-07 – 2022-12-10 (×11): 400 mg via ORAL
  Filled 2022-12-06 (×11): qty 1

## 2022-12-06 MED ORDER — SODIUM CHLORIDE 0.9% FLUSH
3.0000 mL | Freq: Two times a day (BID) | INTRAVENOUS | Status: DC
  Administered 2022-12-07 – 2022-12-10 (×8): 3 mL via INTRAVENOUS

## 2022-12-06 MED ORDER — ACETAMINOPHEN 325 MG PO TABS: 650.0000 mg | ORAL_TABLET | Freq: Four times a day (QID) | ORAL | Status: DC | PRN

## 2022-12-06 MED ORDER — VANCOMYCIN HCL 500 MG/100ML IV SOLN
500.0000 mg | Freq: Once | INTRAVENOUS | Status: AC
  Administered 2022-12-07: 500 mg via INTRAVENOUS
  Filled 2022-12-06: qty 100

## 2022-12-06 MED ORDER — IOHEXOL 300 MG/ML  SOLN
100.0000 mL | Freq: Once | INTRAMUSCULAR | Status: AC | PRN
  Administered 2022-12-06: 100 mL via INTRAVENOUS

## 2022-12-06 MED ORDER — FAMOTIDINE IN NACL 20-0.9 MG/50ML-% IV SOLN
20.0000 mg | Freq: Two times a day (BID) | INTRAVENOUS | Status: DC
  Administered 2022-12-06: 20 mg via INTRAVENOUS
  Filled 2022-12-06 (×2): qty 50

## 2022-12-06 MED ORDER — CARVEDILOL 3.125 MG PO TABS
3.1250 mg | ORAL_TABLET | Freq: Two times a day (BID) | ORAL | Status: DC
  Administered 2022-12-07 – 2022-12-10 (×7): 3.125 mg via ORAL
  Filled 2022-12-06 (×7): qty 1

## 2022-12-06 MED ORDER — SODIUM CHLORIDE 0.9 % IV SOLN
2.0000 g | Freq: Three times a day (TID) | INTRAVENOUS | Status: DC
  Administered 2022-12-07: 2 g via INTRAVENOUS
  Filled 2022-12-06 (×2): qty 12.5

## 2022-12-06 MED ORDER — VANCOMYCIN HCL IN DEXTROSE 1-5 GM/200ML-% IV SOLN: 1000.0000 mg | Freq: Once | INTRAVENOUS | Status: DC

## 2022-12-06 MED ORDER — ACETAMINOPHEN 650 MG RE SUPP: 650.0000 mg | Freq: Four times a day (QID) | RECTAL | Status: DC | PRN

## 2022-12-06 MED ORDER — LACTATED RINGERS IV SOLN: INTRAVENOUS | Status: DC

## 2022-12-06 MED ORDER — METRONIDAZOLE 500 MG/100ML IV SOLN
500.0000 mg | Freq: Once | INTRAVENOUS | Status: AC
  Administered 2022-12-06: 500 mg via INTRAVENOUS
  Filled 2022-12-06: qty 100

## 2022-12-06 MED ORDER — METRONIDAZOLE 500 MG/100ML IV SOLN
500.0000 mg | Freq: Two times a day (BID) | INTRAVENOUS | Status: DC
  Administered 2022-12-07: 500 mg via INTRAVENOUS
  Filled 2022-12-06: qty 100

## 2022-12-06 MED ORDER — DUTASTERIDE 0.5 MG PO CAPS
0.5000 mg | ORAL_CAPSULE | Freq: Every day | ORAL | Status: DC
  Administered 2022-12-07 – 2022-12-10 (×4): 0.5 mg via ORAL
  Filled 2022-12-06 (×4): qty 1

## 2022-12-06 MED ORDER — INSULIN ASPART 100 UNIT/ML IJ SOLN
0.0000 [IU] | Freq: Three times a day (TID) | INTRAMUSCULAR | Status: DC
  Administered 2022-12-07: 2 [IU] via SUBCUTANEOUS
  Administered 2022-12-08: 3 [IU] via SUBCUTANEOUS
  Administered 2022-12-08: 2 [IU] via SUBCUTANEOUS
  Administered 2022-12-09: 5 [IU] via SUBCUTANEOUS
  Administered 2022-12-09: 2 [IU] via SUBCUTANEOUS
  Administered 2022-12-10: 3 [IU] via SUBCUTANEOUS
  Administered 2022-12-10: 5 [IU] via SUBCUTANEOUS
  Filled 2022-12-06 (×7): qty 1

## 2022-12-06 MED ORDER — NICOTINE 14 MG/24HR TD PT24
14.0000 mg | MEDICATED_PATCH | Freq: Every day | TRANSDERMAL | Status: DC
  Filled 2022-12-06 (×4): qty 1

## 2022-12-06 MED ORDER — IBUPROFEN 800 MG PO TABS
800.0000 mg | ORAL_TABLET | Freq: Once | ORAL | Status: AC
  Administered 2022-12-06: 800 mg via ORAL
  Filled 2022-12-06: qty 1

## 2022-12-06 MED ORDER — SODIUM CHLORIDE 0.9 % IV SOLN
2.0000 g | Freq: Once | INTRAVENOUS | Status: AC
  Administered 2022-12-06: 2 g via INTRAVENOUS
  Filled 2022-12-06: qty 12.5

## 2022-12-06 MED ORDER — TAMSULOSIN HCL 0.4 MG PO CAPS
0.4000 mg | ORAL_CAPSULE | Freq: Every day | ORAL | Status: DC
  Administered 2022-12-07 – 2022-12-10 (×4): 0.4 mg via ORAL
  Filled 2022-12-06 (×4): qty 1

## 2022-12-06 MED ORDER — ATORVASTATIN CALCIUM 20 MG PO TABS
40.0000 mg | ORAL_TABLET | Freq: Every day | ORAL | Status: DC
  Administered 2022-12-07 – 2022-12-10 (×4): 40 mg via ORAL
  Filled 2022-12-06 (×4): qty 2

## 2022-12-06 MED ORDER — MIRABEGRON ER 25 MG PO TB24
25.0000 mg | ORAL_TABLET | Freq: Every day | ORAL | Status: DC
  Administered 2022-12-07 – 2022-12-10 (×4): 25 mg via ORAL
  Filled 2022-12-06 (×4): qty 1

## 2022-12-06 MED ORDER — HYDROCODONE-ACETAMINOPHEN 5-325 MG PO TABS: 1.0000 | ORAL_TABLET | ORAL | Status: DC | PRN

## 2022-12-06 MED ORDER — VANCOMYCIN HCL IN DEXTROSE 1-5 GM/200ML-% IV SOLN
1000.0000 mg | Freq: Two times a day (BID) | INTRAVENOUS | Status: DC
  Administered 2022-12-07: 1000 mg via INTRAVENOUS
  Filled 2022-12-06: qty 200

## 2022-12-06 MED ORDER — MORPHINE SULFATE (PF) 2 MG/ML IV SOLN: 2.0000 mg | INTRAVENOUS | Status: DC | PRN

## 2022-12-06 NOTE — H&P (Addendum)
History and Physical    Patient: Justin Hammond:[id number] DOB: [birth year] DOA: 12/06/2022 DOS: the patient was seen and examined on 12/06/2022 PCP: Jaclyn Shaggy, MD  Patient coming from: Home   Chief Complaint:  Chief Complaint  Patient presents with   Fever    Patient presents febrile with temp of 102.9 (took Tylenol at 11:00 today, Motrin given in triage) and c/o chills, weakness, and very mild abdominal pain; Reports diarrhea a few days ago but that has since subsided    HPI: Justin Hammond is a 57 y.o. male with medical history significant for  CAD status post stent 2018 followed by [facility] clinic cardiology/ DM II/ obesity/ HTN coming for fever and chills that started today about 1 pm.  Patient also does report diarrhea for about a week.  No blood noted.  Patient is currently established with [facility] health urology Dr. Richardo Hanks for his bladder cancer/BPH with urinary obstruction symptoms.  Emergency room on my assessment patient is alert awake oriented morbidly obese follows commands afebrile. Initial EKG in the emergency room shows sinus rhythm 70 bpm with normal interval and a prolonged PR. Blood work shows normal CBC. CMP shows glucose of 125 calcium 7.7 albumin 3.3 AST elevated mildly at 42 and lipase of 107. Respiratory panel is negative for influenza COVID and RSV. Urinalysis is clear with 30 protein and glucosuria. Blood cultures collected in the emergency room. CT imaging done of the abdomen and pelvis shows a large 10.2 cm left kidney mass consistent with renal cell carcinoma cholelithiasis without evidence of cholecystitis.  No obstruction inflammation or enteritis. Chest x-ray is negative for any acute cardiopulmonary disease. 2 d echo done in 12/2019 shows:  1. Left ventricular ejection fraction, by estimation, is 60 to 65%. The  left ventricle has normal function. The left ventricle has no regional  wall motion abnormalities. Left ventricular diastolic  parameters were  normal.   2. Right ventricular systolic function is normal. The right ventricular  size is normal. There is normal pulmonary artery systolic pressure.   3. The mitral valve is normal in structure. No evidence of mitral valve  regurgitation.   4. The aortic valve is grossly normal. Aortic valve regurgitation is not  visualized.   Review of Systems: Review of Systems  Constitutional:  Positive for chills and fever.  All other systems reviewed and are negative.  Past Medical History:  Diagnosis Date   Cancer (HCC)    bladder cancer   Diabetes mellitus without complication (HCC)    Hyperlipidemia    Proteinuria    Past Surgical History:  Procedure Laterality Date   APPENDECTOMY     CORONARY STENT INTERVENTION N/A 12/28/2016   Procedure: CORONARY STENT INTERVENTION;  Surgeon: Dalia Heading, MD;  Location: [facility] INVASIVE CV LAB;  Service: Cardiovascular;  Laterality: N/A;   CORONARY STENT INTERVENTION N/A 12/28/2016   Procedure: CORONARY STENT INTERVENTION;  Surgeon: Marcina Millard, MD;  Location: [facility] INVASIVE CV LAB;  Service: Cardiovascular;  Laterality: N/A;   LEFT HEART CATH AND CORONARY ANGIOGRAPHY N/A 12/28/2016   Procedure: LEFT HEART CATH AND CORONARY ANGIOGRAPHY;  Surgeon: Dalia Heading, MD;  Location: [facility] INVASIVE CV LAB;  Service: Cardiovascular;  Laterality: N/A;   Social History:  reports that he has never smoked. He has never used smokeless tobacco. He reports that he does not drink alcohol and does not use drugs.  Allergies  Allergen Reactions   Penicillins Itching    Has patient had a PCN  reaction causing immediate rash, facial/tongue/throat swelling, SOB or lightheadedness with hypotension: No Has patient had a PCN reaction causing severe rash involving mucus membranes or skin necrosis: No Has patient had a PCN reaction that required hospitalization: No Has patient had a PCN reaction occurring within the last 10 years: No If all of the  above answers are "NO", then may proceed with Cephalosporin use.    Family History  Problem Relation Age of Onset   Breast cancer Mother    CAD Father    Prior to Admission medications   Medication Sig Start Date End Date Taking? Authorizing Provider  atorvastatin (LIPITOR) 40 MG tablet Take 1 tablet (40 mg total) by mouth daily. 12/29/16   Altamese Dilling, MD  carvedilol (COREG) 3.125 MG tablet Take 1 tablet (3.125 mg total) by mouth 2 (two) times daily with a meal. 12/29/16   Altamese Dilling, MD  dutasteride (AVODART) 0.5 MG capsule Take 1 capsule (0.5 mg total) by mouth daily. 07/11/22   Stoioff, Verna Czech, MD  gabapentin (NEURONTIN) 400 MG capsule Take 1 capsule by mouth 3 (three) times daily. 10/21/16   [provider]  glimepiride (AMARYL) 4 MG tablet Take 1 tablet by mouth daily. 10/12/15   [provider]  JARDIANCE 25 MG TABS tablet Take 25 mg by mouth every morning. 10/05/19   [provider]  losartan (COZAAR) 100 MG tablet Take 1 tablet by mouth daily. 12/05/16   [provider]  metFORMIN (GLUCOPHAGE) 500 MG tablet Take 500 mg by mouth 2 (two) times daily. 08/19/15   [provider]  omeprazole (PRILOSEC OTC) 20 MG tablet Take 1 tablet (20 mg total) by mouth daily. Patient taking differently: Take 20 mg by mouth daily as needed. 10/10/21 09/19/22  Merwyn Katos, MD  Semaglutide, 1 MG/DOSE, (OZEMPIC, 1 MG/DOSE,) 4 MG/3ML SOPN Inject 1 mg into the skin.    [provider]  tamsulosin (FLOMAX) 0.4 MG CAPS capsule Take 1 capsule (0.4 mg total) by mouth daily. 07/11/22   Stoioff, Verna Czech, MD  Vibegron (GEMTESA) 75 MG TABS Take 1 tablet (75 mg total) by mouth daily. 07/11/22   Riki Altes, MD   Vitals:   12/06/22 2006 12/06/22 2015 12/06/22 2030 12/06/22 2045  BP: (!) 121/57 (!) 117/59 (!) 111/54 125/68  Pulse:  61 (!) 59 63  Resp:      Temp:      TempSrc:      SpO2:  97% 98% 98%  Weight:      Height:       Physical  Exam Vitals and nursing note reviewed.  Constitutional:      General: He is not in acute distress.    Appearance: He is obese.  HENT:     Head: Normocephalic and atraumatic.     Right Ear: Hearing normal.     Left Ear: Hearing normal.     Nose: Nose normal. No nasal deformity.     Mouth/Throat:     Lips: Pink.     Tongue: No lesions.     Pharynx: Oropharynx is clear.  Eyes:     General: Lids are normal.     Extraocular Movements: Extraocular movements intact.  Cardiovascular:     Rate and Rhythm: Normal rate and regular rhythm.     Heart sounds: Normal heart sounds.  Pulmonary:     Effort: Pulmonary effort is normal.     Breath sounds: Normal breath sounds.  Abdominal:     General:  Bowel sounds are normal. There is no distension.     Palpations: Abdomen is soft. There is no mass.     Tenderness: There is no abdominal tenderness.  Musculoskeletal:     Right lower leg: No edema.     Left lower leg: No edema.  Skin:    General: Skin is warm.  Neurological:     General: No focal deficit present.     Mental Status: He is alert and oriented to person, place, and time.     Cranial Nerves: Cranial nerves 2-12 are intact.  Psychiatric:        Attention and Perception: Attention normal.        Mood and Affect: Mood normal.        Speech: Speech normal.        Behavior: Behavior normal. Behavior is cooperative.     Labs on Admission: I have personally reviewed following labs and imaging studies  CBC: Recent Labs  Lab 12/06/22 1623  WBC 4.1  NEUTROABS 3.2  HGB 14.0  HCT 42.0  MCV 84.5  PLT 199   Basic Metabolic Panel: Recent Labs  Lab 12/06/22 1623  NA 131*  K 3.6  CL 104  CO2 21*  GLUCOSE 125*  BUN 18  CREATININE 1.23  CALCIUM 7.7*   GFR: Estimated Creatinine Clearance: 87.3 mL/min (by C-G formula based on SCr of 1.23 mg/dL). Liver Function Tests: Recent Labs  Lab 12/06/22 1623  AST 42*  ALT 41  ALKPHOS 90  BILITOT 1.1  PROT 6.5  ALBUMIN 3.3*    Recent Labs  Lab 12/06/22 1623  LIPASE 107*   No results for input(s): "AMMONIA" in the last 168 hours. Coagulation Profile: No results for input(s): "INR", "PROTIME" in the last 168 hours. Cardiac Enzymes: No results for input(s): "CKTOTAL", "CKMB", "CKMBINDEX", "TROPONINI" in the last 168 hours. BNP (last 3 results) No results for input(s): "PROBNP" in the last 8760 hours. HbA1C: No results for input(s): "HGBA1C" in the last 72 hours. CBG: No results for input(s): "GLUCAP" in the last 168 hours. Lipid Profile: No results for input(s): "CHOL", "HDL", "LDLCALC", "TRIG", "CHOLHDL", "LDLDIRECT" in the last 72 hours. Thyroid Function Tests: No results for input(s): "TSH", "T4TOTAL", "FREET4", "T3FREE", "THYROIDAB" in the last 72 hours. Anemia Panel: No results for input(s): "VITAMINB12", "FOLATE", "FERRITIN", "TIBC", "IRON", "RETICCTPCT" in the last 72 hours. Urine analysis: Urinalysis    Component Value Date/Time   COLORURINE YELLOW (A) 12/06/2022 1622   APPEARANCEUR CLEAR (A) 12/06/2022 1622   APPEARANCEUR Clear 09/19/2022 1434   LABSPEC >1.046 (H) 12/06/2022 1622   PHURINE 5.0 12/06/2022 1622   GLUCOSEU >=500 (A) 12/06/2022 1622   HGBUR NEGATIVE 12/06/2022 1622   BILIRUBINUR NEGATIVE 12/06/2022 1622   BILIRUBINUR Negative 09/19/2022 1434   KETONESUR NEGATIVE 12/06/2022 1622   PROTEINUR 30 (A) 12/06/2022 1622   NITRITE NEGATIVE 12/06/2022 1622   LEUKOCYTESUR NEGATIVE 12/06/2022 1622    Unresulted Labs (From admission, onward)     Start     Ordered   12/06/22 2109  Hemoglobin A1c  Add-on,   AD        12/06/22 2109   12/06/22 2003  Gastrointestinal Panel by PCR , Stool  (Gastrointestinal Panel by PCR, Stool                                                                                                                                                     **  Does Not include CLOSTRIDIUM DIFFICILE testing. **If CDIFF testing is needed, place order from the "C Difficile  Testing" order set.**)  Once,   URGENT        12/06/22 2002   12/06/22 1957  Urine Culture  Add-on,   AD       Question:  Indication  Answer:  Sepsis   12/06/22 1956   12/06/22 1956  Blood culture (routine x 2)  BLOOD CULTURE X 2,   STAT      12/06/22 1955           Radiological Exams on Admission: CT ABDOMEN PELVIS W CONTRAST  Result Date: 12/06/2022 CLINICAL DATA:  Diarrhea and left lower quadrant pain. Fever, chills, and weakness. EXAM: CT ABDOMEN AND PELVIS WITH CONTRAST TECHNIQUE: Multidetector CT imaging of the abdomen and pelvis was performed using the standard protocol following bolus administration of intravenous contrast. RADIATION DOSE REDUCTION: This exam was performed according to the departmental dose-optimization program which includes automated exposure control, adjustment of the mA and/or kV according to patient size and/or use of iterative reconstruction technique. CONTRAST:  OMNIPAQUE IOHEXOL 300 MG/ML  SOLN COMPARISON:  Ultrasound 10/09/2021.  CT chest 12/27/2016 FINDINGS: Lower chest: Lung bases are clear. Hepatobiliary: No focal liver lesions. Cholelithiasis. No gallbladder wall thickening or stranding. No bile duct dilatation. Pancreas: Unremarkable. No pancreatic ductal dilatation or surrounding inflammatory changes. Spleen: Normal in size without focal abnormality. Adrenals/Urinary Tract: No adrenal gland nodules. Large heterogeneously enhancing mass in the anterior left kidney measuring 8.3 by 8.4 x 10.2 cm in diameter. Appearances consistent with renal cell carcinoma. No definite tumor thrombus. No suggestion of local extension or adjacent lymphadenopathy. No hydronephrosis or hydroureter. The right kidney and bladder are normal. Stomach/Bowel: Stomach is within normal limits. Appendix is not identified. No evidence of bowel wall thickening, distention, or inflammatory changes. Vascular/Lymphatic: No significant vascular findings are present. No enlarged abdominal or  pelvic lymph nodes. Reproductive: Prostate is unremarkable. Other: No abdominal wall hernia or abnormality. No abdominopelvic ascites. Musculoskeletal: Degenerative changes in the spine. No focal bone lesions. IMPRESSION: 1. 10.2 cm diameter solid mass in the left kidney consistent with renal cell carcinoma. No metastatic disease is identified. 2. Cholelithiasis without evidence of acute cholecystitis. 3. No evidence of bowel obstruction or inflammation. Electronically Signed   By: Burman Nieves M.D.   On: 12/06/2022 18:28   DG Chest 2 View  Result Date: 12/06/2022 CLINICAL DATA:  Fever of unknown origin EXAM: CHEST - 2 VIEW COMPARISON:  Chest radiograph dated 10/09/2021 FINDINGS: Normal lung volumes. No focal consolidations. No pleural effusion or pneumothorax. The heart size and mediastinal contours are within normal limits. No acute osseous abnormality. IMPRESSION: No active cardiopulmonary disease. Electronically Signed   By: Agustin Cree M.D.   On: 12/06/2022 16:47     Data Reviewed: Relevant notes from primary care and specialist visits, past discharge summaries as available in EHR, including Care Everywhere. Prior diagnostic testing as pertinent to current admission diagnoses Updated medications and problem lists for reconciliation ED course, including vitals, labs, imaging, treatment and response to treatment Triage notes, nursing and pharmacy notes and ED provider's notes Notable results as noted in HPI Assessment and Plan: >>Fevers and chills: We will follow cultures. D/D infectious vs noninfectious.  PRN tylenol.  ? If this is related to his renal mass.  LR at 20 cc/hour.   >>Renal mass: C/W renal cell cancer  Urology  consult as needed pt sees Dr. Richardo Hanks. Pt see [facility].   >>  CAD s/p PCI: Cont  atorvastatin 40 mg / coreg 3.125 mg  Stable no chest pain or any complaints.   >>DM II: Cont pt on glycemic protocol.  Last A1c was  8.7  >>HTN: Cont coreg and losartan.    DVT  prophylaxis:  scd's  Consults:  None .  Advance Care Planning:    Code Status: Prior  Family Communication:  none Disposition Plan:  Back to previous home environment  Severity of Illness: The appropriate patient status for this patient is OBSERVATION. Observation status is judged to be reasonable and necessary in order to provide the required intensity of service to ensure the patient's safety. The patient's presenting symptoms, physical exam findings, and initial radiographic and laboratory data in the context of their medical condition is felt to place them at decreased risk for further clinical deterioration. Furthermore, it is anticipated that the patient will be medically stable for discharge from the hospital within 2 midnights of admission.   Author: Gertha Calkin, MD 12/06/2022 9:16 PM  For on call review [email or web address].

## 2022-12-06 NOTE — Code Documentation (Signed)
PHARMACY -  BRIEF ANTIBIOTIC NOTE   Pharmacy has received consult(s) for vancomycin, aztreonam from an ED provider.  The patient's profile has been reviewed for ht/wt/allergies/indication/available labs.    One time order(s) placed for vancomycin 2000 mg, cefepime 2g (penicillin allergy documented as itching and thought to be low risk)  Further antibiotics/pharmacy consults should be ordered by admitting physician if indicated.                       Thank you,  Celene Squibb, PharmD Clinical Pharmacist 12/06/2022 8:17 PM

## 2022-12-06 NOTE — ED Provider Notes (Addendum)
[facility] Provider Note    Event Date/Time   First MD Initiated Contact with Patient 12/06/22 1718     (approximate)  History   Chief Complaint: Fever (Patient presents febrile with temp of 102.9 (took Tylenol at 11:00 today, Motrin given in triage) and c/o chills, weakness, and very mild abdominal pain; Reports diarrhea a few days ago but that has since subsided)  HPI  Justin Hammond is a 57 y.o. male with a past medical history of diabetes, hyperlipidemia, presents to the emergency department for diarrhea and a fever.  According to the patient for the past week or so he has been experiencing diarrhea earlier today developed left lower quadrant abdominal pain so the patient came to the emergency department.  Here the patient was found to be febrile to 102.9.  Patient was unaware of the fever at home.  Patient denies any urinary symptoms.  Denies any cough congestion or chest pain.  Patient states the left lower quadrant abdominal pain is since resolved denies any pain or discomfort currently.  Physical Exam   Triage Vital Signs: ED Triage Vitals  Encounter Vitals Group     BP 12/06/22 1608 (!) 122/46     Systolic BP Percentile --      Diastolic BP Percentile --      Pulse Rate 12/06/22 1608 68     Resp 12/06/22 1608 15     Temp 12/06/22 1600 (!) 102.9 F (39.4 C)     Temp Source 12/06/22 1600 Oral     SpO2 12/06/22 1608 95 %     Weight 12/06/22 1608 300 lb (136.1 kg)     Height 12/06/22 1608 5\' 8"  (1.727 m)     Head Circumference --      Peak Flow --      Pain Score 12/06/22 1618 0     Pain Loc --      Pain Education --      Exclude from Growth Chart --     Most recent vital signs: Vitals:   12/06/22 1600 12/06/22 1608  BP:  (!) 122/46  Pulse:  68  Resp:  15  Temp: (!) 102.9 F (39.4 C)   SpO2:  95%    General: Awake, no distress.  Mild diaphoresis. CV:  Good peripheral perfusion.  Regular rate and rhythm  Resp:  Normal effort.   Equal breath sounds bilaterally.  Abd:  No distention.  Soft, mild left lower quadrant tenderness without rebound or guarding.   ED Results / Procedures / Treatments   EKG  EKG viewed and interpreted by myself shows a sinus rhythm at 70 bpm with a narrow QRS, normal axis, PR prolongation otherwise reassuring intervals with nonspecific but no concerning ST changes.  RADIOLOGY  I have reviewed and interpreted chest x-ray images.  No consolidation seen on my evaluation. Radiology is read the chest x-ray is negative. CT scan shows no significant acute finding but it does show a 10.2 cm solid mass in the left kidney consistent with renal cell carcinoma.  No metastatic disease.  MEDICATIONS ORDERED IN ED: Medications  sodium chloride 0.9 % bolus 1,000 mL (has no administration in time range)  acetaminophen (TYLENOL) tablet 1,000 mg (has no administration in time range)  ibuprofen (ADVIL) tablet 800 mg (800 mg Oral Given 12/06/22 1608)  sodium chloride 0.9 % bolus 1,000 mL (1,000 mLs Intravenous New Bag/Given 12/06/22 1630)     IMPRESSION / MDM / ASSESSMENT AND PLAN /  ED COURSE  I reviewed the triage vital signs and the nursing notes.  Patient's presentation is most consistent with acute presentation with potential threat to life or bodily function.  Patient presents to the emergency department for left lower quadrant abdominal pain diarrhea x 1 week and fever to 102.9.  Differential is quite broad at this time but would include diverticulitis with or without perforation/abscess, colitis, UTI pyelonephritis, gastroenteritis, other intra-abdominal pathology, COVID or other viral etiology.  We will check labs, COVID swab, CT scan abdomen and pelvis.  We will IV hydrate treat with antipyretics and continue to closely monitor.  Patient's labs have resulted reassuringly showing a normal white blood cell count and normal CBC, chemistry shows no concerning findings.  Lipase is elevated 107 patient  denies alcohol use.  Lactic acid is normal at 1.2.  We will continue IV hydration and obtain CT imaging as well as urinalysis.  Patient agreeable to plan of care.  COVID test is pending.  COVID test is negative.  Patient CT scan shows a 10.2 cm left renal mass consistent with renal cell carcinoma.  I discussed this with the patient and the need to follow-up with oncology for further workup she will likely require biopsy and/or nephrectomy.  Patient agreeable to plan and will call to make this appointment.  From an infectious standpoint the patient's workup has been reassuring chemistry slight lipase elevation, normal lactate, normal CBC with a normal white blood cell count.  COVID flu and RSV are negative.  CT scan shows no cause of the patient's fever no obvious infection.  Chest x-ray is clear as well.  Patient has been experiencing diarrhea over the past week or so very likely could be experiencing more of a GI illness.  Urinalysis is pending to rule out urinary tract infection.  Patient's urinalysis shows no concerning finding.  Patient continues to feel weak he states dizzy and lightheaded at times.  Borderline febrile 99 after both Tylenol and ibuprofen.  Given the patient's significant fever weakness dizziness we will cover with IV antibiotics and admit to the hospital service until the patient's urine and blood cultures grow out.  Patient agreeable to plan of care.  The patient is 1 week diarrheal illness now with weakness and fever to 102.7 we will also obtain a stool sample for bio fire/stool antigen testing, which could also possibly provide a source for the patient's fever.  FINAL CLINICAL IMPRESSION(S) / ED DIAGNOSES   Fever Diarrhea Weakness  Note:  This document was prepared using Dragon voice recognition software and may include unintentional dictation errors.   Minna Antis, MD 12/06/22 Cristy Friedlander, MD 12/06/22 2002

## 2022-12-06 NOTE — ED Notes (Signed)
Pt given boxed lunch, denies other needs at this time

## 2022-12-06 NOTE — Consult Note (Signed)
Pharmacy Antibiotic Note  Justin Hammond is a 57 y.o. male admitted on 12/06/2022 with sepsis.  Pharmacy has been consulted for vancomycin dosing. Tmax 102.9 and chills but no source of infection currently.  WBC 4.1, lactic acid 0.9.   Plan: Pt received vancomycin 2000 mg x 1 loading dose, will give an additional 500 mg x 1 dose for a total of 2500 mg. Will order a maintenance dose of 1000 mg q12h. Predicted AUC of 521. Goal 400-600. Scr 1.23, Vd 0.5, IBW. Monitor Scr, slightly trending up.   Continue cefepime 2 g q8H  Continue flagyl 500 mg BID   Height: 5\' 8"  (172.7 cm) Weight: 136.1 kg (300 lb) IBW/kg (Calculated) : 68.4  Temp (24hrs), Avg:100 F (37.8 C), Min:98.2 F (36.8 C), Max:102.9 F (39.4 C)  Recent Labs  Lab 12/06/22 1623 12/06/22 2000  WBC 4.1  --   CREATININE 1.23  --   LATICACIDVEN 1.2 0.9    Estimated Creatinine Clearance: 87.3 mL/min (by C-G formula based on SCr of 1.23 mg/dL).    Allergies  Allergen Reactions   Penicillins Itching    Has patient had a PCN reaction causing immediate rash, facial/tongue/throat swelling, SOB or lightheadedness with hypotension: No Has patient had a PCN reaction causing severe rash involving mucus membranes or skin necrosis: No Has patient had a PCN reaction that required hospitalization: No Has patient had a PCN reaction occurring within the last 10 years: No If all of the above answers are "NO", then may proceed with Cephalosporin use.     Antimicrobials this admission: 7/18 cefepime >>  7/18 vancomycin >>  7/18 flagyl >>   Dose adjustments this admission: None  Microbiology results: 7/18 BCx: pending 7/18 UCx: pending   Thank you for allowing pharmacy to be a part of this patient's care.  Ronnald Ramp, PharmD, BCPS 12/06/2022 11:25 PM

## 2022-12-06 NOTE — ED Triage Notes (Signed)
Patient presents febrile with temp of 102.9 (took Tylenol at 11:00 today, Motrin given in triage) and c/o chills, weakness, and very mild abdominal pain; Reports diarrhea a few days ago but that has since subsided

## 2022-12-06 NOTE — Discharge Instructions (Signed)
Hold metformin (glucophage) while having diarrhea  Hold amaryl (glimepiride) while on levaquin

## 2022-12-07 DIAGNOSIS — R509 Fever, unspecified: Secondary | ICD-10-CM | POA: Diagnosis present

## 2022-12-07 DIAGNOSIS — I2510 Atherosclerotic heart disease of native coronary artery without angina pectoris: Secondary | ICD-10-CM | POA: Diagnosis present

## 2022-12-07 DIAGNOSIS — Z6841 Body Mass Index (BMI) 40.0 and over, adult: Secondary | ICD-10-CM | POA: Diagnosis not present

## 2022-12-07 DIAGNOSIS — E871 Hypo-osmolality and hyponatremia: Secondary | ICD-10-CM

## 2022-12-07 DIAGNOSIS — N2889 Other specified disorders of kidney and ureter: Secondary | ICD-10-CM

## 2022-12-07 DIAGNOSIS — R7881 Bacteremia: Secondary | ICD-10-CM

## 2022-12-07 DIAGNOSIS — Z880 Allergy status to penicillin: Secondary | ICD-10-CM | POA: Diagnosis not present

## 2022-12-07 DIAGNOSIS — C642 Malignant neoplasm of left kidney, except renal pelvis: Secondary | ICD-10-CM | POA: Diagnosis present

## 2022-12-07 DIAGNOSIS — E1169 Type 2 diabetes mellitus with other specified complication: Secondary | ICD-10-CM | POA: Diagnosis present

## 2022-12-07 DIAGNOSIS — Z1152 Encounter for screening for COVID-19: Secondary | ICD-10-CM | POA: Diagnosis not present

## 2022-12-07 DIAGNOSIS — E785 Hyperlipidemia, unspecified: Secondary | ICD-10-CM | POA: Diagnosis present

## 2022-12-07 DIAGNOSIS — B9689 Other specified bacterial agents as the cause of diseases classified elsewhere: Secondary | ICD-10-CM | POA: Diagnosis present

## 2022-12-07 DIAGNOSIS — Z79899 Other long term (current) drug therapy: Secondary | ICD-10-CM | POA: Diagnosis not present

## 2022-12-07 DIAGNOSIS — I10 Essential (primary) hypertension: Secondary | ICD-10-CM | POA: Diagnosis present

## 2022-12-07 DIAGNOSIS — N401 Enlarged prostate with lower urinary tract symptoms: Secondary | ICD-10-CM | POA: Diagnosis present

## 2022-12-07 DIAGNOSIS — K8020 Calculus of gallbladder without cholecystitis without obstruction: Secondary | ICD-10-CM | POA: Diagnosis present

## 2022-12-07 DIAGNOSIS — R7989 Other specified abnormal findings of blood chemistry: Secondary | ICD-10-CM | POA: Diagnosis present

## 2022-12-07 DIAGNOSIS — Z8249 Family history of ischemic heart disease and other diseases of the circulatory system: Secondary | ICD-10-CM | POA: Diagnosis not present

## 2022-12-07 DIAGNOSIS — A0811 Acute gastroenteropathy due to Norwalk agent: Secondary | ICD-10-CM

## 2022-12-07 DIAGNOSIS — N138 Other obstructive and reflux uropathy: Secondary | ICD-10-CM | POA: Diagnosis present

## 2022-12-07 DIAGNOSIS — Z955 Presence of coronary angioplasty implant and graft: Secondary | ICD-10-CM | POA: Diagnosis not present

## 2022-12-07 DIAGNOSIS — A045 Campylobacter enteritis: Secondary | ICD-10-CM | POA: Diagnosis present

## 2022-12-07 DIAGNOSIS — R350 Frequency of micturition: Secondary | ICD-10-CM

## 2022-12-07 DIAGNOSIS — Z7984 Long term (current) use of oral hypoglycemic drugs: Secondary | ICD-10-CM | POA: Diagnosis not present

## 2022-12-07 DIAGNOSIS — N400 Enlarged prostate without lower urinary tract symptoms: Secondary | ICD-10-CM | POA: Insufficient documentation

## 2022-12-07 DIAGNOSIS — C679 Malignant neoplasm of bladder, unspecified: Secondary | ICD-10-CM | POA: Diagnosis present

## 2022-12-07 DIAGNOSIS — R81 Glycosuria: Secondary | ICD-10-CM | POA: Diagnosis present

## 2022-12-07 LAB — CBC
HCT: 42.2 % (ref 39.0–52.0)
Hemoglobin: 14.2 g/dL (ref 13.0–17.0)
MCH: 28.1 pg (ref 26.0–34.0)
MCHC: 33.6 g/dL (ref 30.0–36.0)
MCV: 83.4 fL (ref 80.0–100.0)
Platelets: 186 10*3/uL (ref 150–400)
RBC: 5.06 MIL/uL (ref 4.22–5.81)
RDW: 15.4 % (ref 11.5–15.5)
WBC: 3.9 10*3/uL — ABNORMAL LOW (ref 4.0–10.5)
nRBC: 0 % (ref 0.0–0.2)

## 2022-12-07 LAB — GASTROINTESTINAL PANEL BY PCR, STOOL (REPLACES STOOL CULTURE)
Adenovirus F40/41: NOT DETECTED
Astrovirus: NOT DETECTED
Campylobacter species: DETECTED — AB
Cryptosporidium: NOT DETECTED
Cyclospora cayetanensis: NOT DETECTED
Entamoeba histolytica: NOT DETECTED
Enteroaggregative E coli (EAEC): NOT DETECTED
Enteropathogenic E coli (EPEC): NOT DETECTED
Enterotoxigenic E coli (ETEC): NOT DETECTED
Giardia lamblia: NOT DETECTED
Norovirus GI/GII: DETECTED — AB
Plesimonas shigelloides: NOT DETECTED
Rotavirus A: NOT DETECTED
Salmonella species: NOT DETECTED
Sapovirus (I, II, IV, and V): NOT DETECTED
Shiga like toxin producing E coli (STEC): NOT DETECTED
Shigella/Enteroinvasive E coli (EIEC): NOT DETECTED
Vibrio cholerae: NOT DETECTED
Vibrio species: NOT DETECTED
Yersinia enterocolitica: NOT DETECTED

## 2022-12-07 LAB — COMPREHENSIVE METABOLIC PANEL
ALT: 48 U/L — ABNORMAL HIGH (ref 0–44)
AST: 47 U/L — ABNORMAL HIGH (ref 15–41)
Albumin: 3.3 g/dL — ABNORMAL LOW (ref 3.5–5.0)
Alkaline Phosphatase: 78 U/L (ref 38–126)
Anion gap: 8 (ref 5–15)
BUN: 15 mg/dL (ref 6–20)
CO2: 21 mmol/L — ABNORMAL LOW (ref 22–32)
Calcium: 7.9 mg/dL — ABNORMAL LOW (ref 8.9–10.3)
Chloride: 106 mmol/L (ref 98–111)
Creatinine, Ser: 0.95 mg/dL (ref 0.61–1.24)
GFR, Estimated: >60 mL/min (ref >60)
Glucose, Bld: 88 mg/dL (ref 70–99)
Potassium: 4 mmol/L (ref 3.5–5.1)
Sodium: 135 mmol/L (ref 135–145)
Total Bilirubin: 0.6 mg/dL (ref 0.3–1.2)
Total Protein: 6.4 g/dL — ABNORMAL LOW (ref 6.5–8.1)

## 2022-12-07 LAB — CULTURE, BLOOD (ROUTINE X 2): Culture: NO GROWTH

## 2022-12-07 LAB — BLOOD CULTURE ID PANEL (REFLEXED) - BCID2
A.calcoaceticus-baumannii: NOT DETECTED
Bacteroides fragilis: NOT DETECTED
CTX-M ESBL: NOT DETECTED
Candida albicans: NOT DETECTED
Candida auris: NOT DETECTED
Candida glabrata: NOT DETECTED
Candida krusei: NOT DETECTED
Candida parapsilosis: NOT DETECTED
Candida tropicalis: NOT DETECTED
Carbapenem resist OXA 48 LIKE: NOT DETECTED
Carbapenem resistance IMP: NOT DETECTED
Carbapenem resistance KPC: NOT DETECTED
Carbapenem resistance NDM: NOT DETECTED
Carbapenem resistance VIM: NOT DETECTED
Cryptococcus neoformans/gattii: NOT DETECTED
Enterobacter cloacae complex: NOT DETECTED
Enterobacterales: DETECTED — AB
Enterococcus Faecium: NOT DETECTED
Enterococcus faecalis: NOT DETECTED
Escherichia coli: NOT DETECTED
Haemophilus influenzae: NOT DETECTED
Klebsiella aerogenes: NOT DETECTED
Klebsiella oxytoca: NOT DETECTED
Klebsiella pneumoniae: NOT DETECTED
Listeria monocytogenes: NOT DETECTED
Methicillin resistance mecA/C: DETECTED — AB
Neisseria meningitidis: NOT DETECTED
Proteus species: NOT DETECTED
Pseudomonas aeruginosa: NOT DETECTED
Salmonella species: NOT DETECTED
Serratia marcescens: NOT DETECTED
Staphylococcus aureus (BCID): NOT DETECTED
Staphylococcus epidermidis: DETECTED — AB
Staphylococcus lugdunensis: NOT DETECTED
Staphylococcus species: DETECTED — AB
Stenotrophomonas maltophilia: NOT DETECTED
Streptococcus agalactiae: NOT DETECTED
Streptococcus pneumoniae: NOT DETECTED
Streptococcus pyogenes: NOT DETECTED
Streptococcus species: NOT DETECTED

## 2022-12-07 LAB — PROCALCITONIN: Procalcitonin: 0.22 ng/mL

## 2022-12-07 LAB — GLUCOSE, CAPILLARY
Glucose-Capillary: 100 mg/dL — ABNORMAL HIGH (ref 70–99)
Glucose-Capillary: 103 mg/dL — ABNORMAL HIGH (ref 70–99)
Glucose-Capillary: 124 mg/dL — ABNORMAL HIGH (ref 70–99)
Glucose-Capillary: 146 mg/dL — ABNORMAL HIGH (ref 70–99)
Glucose-Capillary: 82 mg/dL (ref 70–99)

## 2022-12-07 LAB — HIV ANTIBODY (ROUTINE TESTING W REFLEX): HIV Screen 4th Generation wRfx: NONREACTIVE

## 2022-12-07 MED ORDER — FAMOTIDINE 20 MG PO TABS
20.0000 mg | ORAL_TABLET | Freq: Every day | ORAL | Status: DC
  Administered 2022-12-07 – 2022-12-10 (×4): 20 mg via ORAL
  Filled 2022-12-07 (×3): qty 1

## 2022-12-07 MED ORDER — AZITHROMYCIN 500 MG PO TABS
500.0000 mg | ORAL_TABLET | Freq: Every day | ORAL | Status: AC
  Administered 2022-12-07 – 2022-12-09 (×3): 500 mg via ORAL
  Filled 2022-12-07 (×2): qty 1

## 2022-12-07 MED ORDER — METRONIDAZOLE 500 MG/100ML IV SOLN
500.0000 mg | Freq: Two times a day (BID) | INTRAVENOUS | Status: DC
  Administered 2022-12-07 – 2022-12-10 (×6): 500 mg via INTRAVENOUS
  Filled 2022-12-07 (×6): qty 100

## 2022-12-07 MED ORDER — SODIUM CHLORIDE 0.9 % IV SOLN: INTRAVENOUS | Status: DC

## 2022-12-07 MED ORDER — AZITHROMYCIN 500 MG PO TABS
ORAL_TABLET | ORAL | Status: AC
  Filled 2022-12-07: qty 1

## 2022-12-07 MED ORDER — FAMOTIDINE 20 MG PO TABS
ORAL_TABLET | ORAL | Status: AC
  Filled 2022-12-07: qty 1

## 2022-12-07 MED ORDER — SODIUM CHLORIDE 0.9 % IV SOLN
2.0000 g | Freq: Three times a day (TID) | INTRAVENOUS | Status: DC
  Administered 2022-12-07 – 2022-12-10 (×10): 2 g via INTRAVENOUS
  Filled 2022-12-07 (×10): qty 12.5

## 2022-12-07 NOTE — Assessment & Plan Note (Signed)
On coreg

## 2022-12-07 NOTE — Assessment & Plan Note (Signed)
Seen on CT scan.  Will refer back to urology upon discharge.

## 2022-12-07 NOTE — Assessment & Plan Note (Signed)
Pantoea agglomerans in blood cultures.  Will give another dose of Maxipime prior to disposition and 5 doses of high-dose Levaquin starting this evening.  Staph epidermidis likely contaminant.

## 2022-12-07 NOTE — Progress Notes (Signed)
  Progress Note   Patient: Justin Hammond ZOX:[id number] DOB: 24-Sep-1965 DOA: 12/06/2022     0 DOS: the patient was seen and examined on 12/07/2022   Brief hospital course: 57 y.o. male with medical history significant for  CAD status post stent 2018 followed [name]/ DM II/ obesity/ HTN coming for fever and chills that started today about 1 pm.  Patient also does report diarrhea for about a week.  No blood noted.  Patient is currently established with [facility] health urology Dr. Richardo Hanks for his bladder cancer/BPH with urinary obstruction symptoms.   7/19.  Had 2 episodes of diarrhea last night and 2 this am.  Stool studies positive for campylobacter and norovirus. Hydrate today and start zithromax.  Assessment and Plan: * Gram-negative bacteremia Gram-negative rod and aerobic bottle only.  Restart cefepime and Flagyl.  Staph epidermidis in 1 bottle likely contaminant.  Campylobacter diarrhea Start zithromax  Norovirus Supportive care and ivf hydration  Left kidney mass Seen on CT scan.  Will refer back to urology upon discharge.  Essential hypertension On coreg  Hyperlipidemia On lipitor  Obesity, Class III, BMI 40-49.9 (morbid obesity) (HCC) BMI 45.61  Type 2 diabetes mellitus with hyperlipidemia (HCC) Hold metformin with diarrhea  BPH (benign prostatic hyperplasia) On Avodart and Flomax  Hyponatremia Sodium improved  Elevated LFTs Likely secondary to gastroenteritis.        Subjective: Patient feeling little bit better today.  Had 2 episodes of diarrhea last night into this morning.  Stool studies positive for norovirus and Campylobacter.  Also found to have a kidney mass on CT scan.  Admitted with diarrhea.  Physical Exam: Vitals:   12/06/22 2030 12/06/22 2045 12/07/22 0041 12/07/22 0749  BP: (!) 111/54 125/68 115/73 113/62  Pulse: (!) 59 63 64 (!) 58  Resp:   18 18  Temp:   97.8 F (36.6 C)   TempSrc:      SpO2: 98% 98% 99% 100%   Weight:      Height:       Physical Exam HENT:     Head: Normocephalic.     Mouth/Throat:     Pharynx: No oropharyngeal exudate.  Eyes:     General: Lids are normal.     Conjunctiva/sclera: Conjunctivae normal.  Cardiovascular:     Rate and Rhythm: Normal rate and regular rhythm.     Heart sounds: Normal heart sounds, S1 normal and S2 normal.  Pulmonary:     Breath sounds: No decreased breath sounds, wheezing, rhonchi or rales.  Abdominal:     Palpations: Abdomen is soft.     Tenderness: There is no abdominal tenderness.  Musculoskeletal:     Right lower leg: No swelling.     Left lower leg: No swelling.  Skin:    General: Skin is warm.     Findings: No rash.  Neurological:     Mental Status: He is alert and oriented to person, place, and time.     Data Reviewed: Stool studies positive for norovirus and Campylobacter.  1 blood culture positive for gram-negative rod.  Sodium 135, creatinine 1.95 AST 47 and ALT 48, white blood cell count 3.9  Family Communication: Family at bedside  Disposition: Status is: Observation Will watch in the hospital further with 1 blood culture positive and patient still having diarrhea.  Planned Discharge Destination: Home    Time spent: 28 minutes  Author: Alford Highland, MD 12/07/2022 2:39 PM  For on call review [email or web address].

## 2022-12-07 NOTE — Assessment & Plan Note (Signed)
Start zithromax

## 2022-12-07 NOTE — Hospital Course (Signed)
57 y.o. male with medical history significant for  CAD status post stent 2018 followed by [facility] clinic cardiology/ DM II/ obesity/ HTN coming for fever and chills that started today about 1 pm.  Patient also does report diarrhea for about a week.  No blood noted.  Patient is currently established with [facility] health urology Dr. Richardo Hanks for his bladder cancer/BPH with urinary obstruction symptoms.   7/19.  Had 2 episodes of diarrhea last night and 2 this am.  Stool studies positive for campylobacter and norovirus. Hydrate today and start zithromax.

## 2022-12-07 NOTE — TOC Progression Note (Signed)
Transition of Care [facility]) - Progression Note    Patient Details  Name: Justin Hammond MRN: [id number] Date of Birth: 20-Jul-1965  [facility] CM/SW Contact  Marlowe Sax, RN Phone Number: 12/07/2022, 10:33 AM  Clinical Narrative:    Transition of Care [facility]) - Inpatient Brief Assessment   Patient Details  Name: Justin Hammond MRN: [id number] Date of Birth: 27-Oct-1965  [facility]) CM/SW Contact:    Marlowe Sax, RN Phone Number: 12/07/2022, 10:33 AM   Clinical Narrative: No identified TOC needs   Transition of Care Asessment: Insurance and Status: Insurance coverage has been reviewed Patient has primary care physician: Yes Arlana Pouch, Jillene Bucks, MD) Home environment has been reviewed: yes Prior level of function:: independent Prior/Current Home Services: No current home services Social Determinants of Health Reivew: SDOH reviewed no interventions necessary Readmission risk has been reviewed: Yes Transition of care needs: no transition of care needs at this time         Expected Discharge Plan and Services                                               Social Determinants of Health (SDOH) Interventions SDOH Screenings   Food Insecurity: No Food Insecurity (12/07/2022)  Housing: Patient Declined (12/07/2022)  Transportation Needs: No Transportation Needs (12/07/2022)  Utilities: Not At Risk (12/07/2022)  Tobacco Use: Low Risk  (11/19/2022)    Readmission Risk Interventions     No data to display

## 2022-12-07 NOTE — Assessment & Plan Note (Signed)
Sodium improved 

## 2022-12-07 NOTE — Plan of Care (Signed)

## 2022-12-07 NOTE — Consult Note (Signed)
Pharmacy Antibiotic Note  Justin Hammond is a 57 y.o. male admitted on 12/06/2022 with sepsis.  Pharmacy has been consulted for vancomycin dosing. Tmax 102.9 and chills but no source of infection currently.  WBC 4.1, lactic acid 0.9.   Today, 12/07/2022 WBC WNL Tm/24h 102.9 Renal: SCR WNL GIP: + campylobacter (on azithromycin) and norovirus Blood cx: GPC and GNR in the same anaerobic bottle of one set. BCID detects MRSE and enterobacterales (not specific organism identified from panel). MRSE suspect contaminant  Plan: Based on blood culture w/ GNR in anaerobic bottle, resume cefepime 2gm q8h and metronidazole 500 mg BID  Await final blood cx result  Height: 5\' 8"  (172.7 cm) Weight: 136.1 kg (300 lb) IBW/kg (Calculated) : 68.4  Temp (24hrs), Avg:99.5 F (37.5 C), Min:97.8 F (36.6 C), Max:102.9 F (39.4 C)  Recent Labs  Lab 12/06/22 1623 12/06/22 2000 12/07/22 0922  WBC 4.1  --  3.9*  CREATININE 1.23  --  0.95  LATICACIDVEN 1.2 0.9  --     Estimated Creatinine Clearance: 113.1 mL/min (by C-G formula based on SCr of 0.95 mg/dL).    Allergies  Allergen Reactions   Penicillins Itching    Has patient had a PCN reaction causing immediate rash, facial/tongue/throat swelling, SOB or lightheadedness with hypotension: No Has patient had a PCN reaction causing severe rash involving mucus membranes or skin necrosis: No Has patient had a PCN reaction that required hospitalization: No Has patient had a PCN reaction occurring within the last 10 years: No If all of the above answers are "NO", then may proceed with Cephalosporin use.     Antimicrobials this admission: 7/18 cefepime >>  7/18 vancomycin >> 7/19 7/18 flagyl >>   Dose adjustments this admission: None  Microbiology results: 7/18 BCx: GPC and GNR in the same anaerobic bottle of one set. BCID detects MRSE and enterobacterales (not specific organism identified from panel). MRSE suspect contaminant 7/18 UCx: pending    Thank you for allowing pharmacy to be a part of this patient's care.  Juliette Alcide, PharmD, BCPS, BCIDP Work Cell: [phone] 12/07/2022 2:38 PM

## 2022-12-07 NOTE — Assessment & Plan Note (Signed)
BMI 45.61

## 2022-12-07 NOTE — Plan of Care (Signed)
  Problem: Education: Goal: Ability to describe self-care measures that may prevent or decrease complications (Diabetes Survival Skills Education) will improve Outcome: Progressing   Problem: Skin Integrity: Goal: Risk for impaired skin integrity will decrease Outcome: Progressing   Problem: Pain Managment: Goal: General experience of comfort will improve Outcome: Progressing   Problem: Safety: Goal: Ability to remain free from injury will improve Outcome: Progressing   Problem: Skin Integrity: Goal: Risk for impaired skin integrity will decrease Outcome: Progressing   

## 2022-12-07 NOTE — Assessment & Plan Note (Signed)
On Avodart and Flomax

## 2022-12-07 NOTE — Assessment & Plan Note (Signed)
Supportive care

## 2022-12-07 NOTE — Assessment & Plan Note (Signed)
Likely secondary to gastroenteritis.

## 2022-12-07 NOTE — Assessment & Plan Note (Signed)
On lipitor

## 2022-12-07 NOTE — Progress Notes (Signed)
PHARMACY - PHYSICIAN COMMUNICATION CRITICAL VALUE ALERT - BLOOD CULTURE IDENTIFICATION (BCID)  Justin Hammond is an 57 y.o. male who presented to [facility] on 12/06/2022 with a chief complaint of fever, chills, abdominal pain and diarrhea  Assessment:  7/18 blood cultures with GPC and GNR in the same anaerobic bottle of one set.  BCID detects MRSE and enterobacterales (not specific organism identified from panel).    Name of physician (or Provider) Contacted: Dr Renae Gloss  Current antibiotics: Azithromycin for campylobacter on GI panel (also norovirus detected on GI panel).   Changes to prescribed antibiotics recommended:  Recommendations accepted by provider - start cefepime and metronidazole  Results for orders placed or performed during the hospital encounter of 12/06/22  Blood Culture ID Panel (Reflexed) (Collected: 12/06/2022  8:05 PM)  Result Value Ref Range   Enterococcus faecalis NOT DETECTED NOT DETECTED   Enterococcus Faecium NOT DETECTED NOT DETECTED   Listeria monocytogenes NOT DETECTED NOT DETECTED   Staphylococcus species DETECTED (A) NOT DETECTED   Staphylococcus aureus (BCID) NOT DETECTED NOT DETECTED   Staphylococcus epidermidis DETECTED (A) NOT DETECTED   Staphylococcus lugdunensis NOT DETECTED NOT DETECTED   Streptococcus species NOT DETECTED NOT DETECTED   Streptococcus agalactiae NOT DETECTED NOT DETECTED   Streptococcus pneumoniae NOT DETECTED NOT DETECTED   Streptococcus pyogenes NOT DETECTED NOT DETECTED   A.calcoaceticus-baumannii NOT DETECTED NOT DETECTED   Bacteroides fragilis NOT DETECTED NOT DETECTED   Enterobacterales DETECTED (A) NOT DETECTED   Enterobacter cloacae complex NOT DETECTED NOT DETECTED   Escherichia coli NOT DETECTED NOT DETECTED   Klebsiella aerogenes NOT DETECTED NOT DETECTED   Klebsiella oxytoca NOT DETECTED NOT DETECTED   Klebsiella pneumoniae NOT DETECTED NOT DETECTED   Proteus species NOT DETECTED NOT DETECTED   Salmonella  species NOT DETECTED NOT DETECTED   Serratia marcescens NOT DETECTED NOT DETECTED   Haemophilus influenzae NOT DETECTED NOT DETECTED   Neisseria meningitidis NOT DETECTED NOT DETECTED   Pseudomonas aeruginosa NOT DETECTED NOT DETECTED   Stenotrophomonas maltophilia NOT DETECTED NOT DETECTED   Candida albicans NOT DETECTED NOT DETECTED   Candida auris NOT DETECTED NOT DETECTED   Candida glabrata NOT DETECTED NOT DETECTED   Candida krusei NOT DETECTED NOT DETECTED   Candida parapsilosis NOT DETECTED NOT DETECTED   Candida tropicalis NOT DETECTED NOT DETECTED   Cryptococcus neoformans/gattii NOT DETECTED NOT DETECTED   CTX-M ESBL NOT DETECTED NOT DETECTED   Carbapenem resistance IMP NOT DETECTED NOT DETECTED   Carbapenem resistance KPC NOT DETECTED NOT DETECTED   Methicillin resistance mecA/C DETECTED (A) NOT DETECTED   Carbapenem resistance NDM NOT DETECTED NOT DETECTED   Carbapenem resist OXA 48 LIKE NOT DETECTED NOT DETECTED   Carbapenem resistance VIM NOT DETECTED NOT DETECTED    Juliette Alcide, PharmD, BCPS, BCIDP Work Cell: [phone] 12/07/2022 2:20 PM

## 2022-12-07 NOTE — Assessment & Plan Note (Signed)
Hold metformin with diarrhea.  Hold glimepiride while on Levaquin.  Can go back on other diabetic medications.  Last hemoglobin A1c 7.9

## 2022-12-08 DIAGNOSIS — N400 Enlarged prostate without lower urinary tract symptoms: Secondary | ICD-10-CM

## 2022-12-08 DIAGNOSIS — R7881 Bacteremia: Secondary | ICD-10-CM | POA: Diagnosis not present

## 2022-12-08 DIAGNOSIS — A0811 Acute gastroenteropathy due to Norwalk agent: Secondary | ICD-10-CM | POA: Diagnosis not present

## 2022-12-08 DIAGNOSIS — A045 Campylobacter enteritis: Secondary | ICD-10-CM | POA: Diagnosis not present

## 2022-12-08 DIAGNOSIS — E1169 Type 2 diabetes mellitus with other specified complication: Secondary | ICD-10-CM

## 2022-12-08 DIAGNOSIS — N2889 Other specified disorders of kidney and ureter: Secondary | ICD-10-CM | POA: Diagnosis not present

## 2022-12-08 LAB — CBC WITH DIFFERENTIAL/PLATELET
Abs Immature Granulocytes: 0.1 10*3/uL — ABNORMAL HIGH (ref 0.00–0.07)
Basophils Absolute: 0.1 10*3/uL (ref 0.0–0.1)
Basophils Relative: 1 %
Eosinophils Absolute: 0 10*3/uL (ref 0.0–0.5)
Eosinophils Relative: 1 %
HCT: 41.1 % (ref 39.0–52.0)
Hemoglobin: 13.5 g/dL (ref 13.0–17.0)
Immature Granulocytes: 2 %
Lymphocytes Relative: 19 %
Lymphs Abs: 0.9 10*3/uL (ref 0.7–4.0)
MCH: 27.6 pg (ref 26.0–34.0)
MCHC: 32.8 g/dL (ref 30.0–36.0)
MCV: 83.9 fL (ref 80.0–100.0)
Monocytes Absolute: 0.6 10*3/uL (ref 0.1–1.0)
Monocytes Relative: 11 %
Neutro Abs: 3.3 10*3/uL (ref 1.7–7.7)
Neutrophils Relative %: 66 %
Platelets: 201 10*3/uL (ref 150–400)
RBC: 4.9 MIL/uL (ref 4.22–5.81)
RDW: 15.4 % (ref 11.5–15.5)
WBC: 5 10*3/uL (ref 4.0–10.5)
nRBC: 0 % (ref 0.0–0.2)

## 2022-12-08 LAB — CULTURE, BLOOD (ROUTINE X 2): Special Requests: ADEQUATE

## 2022-12-08 LAB — GLUCOSE, CAPILLARY
Glucose-Capillary: 87 mg/dL (ref 70–99)
Glucose-Capillary: 157 mg/dL — ABNORMAL HIGH (ref 70–99)
Glucose-Capillary: 134 mg/dL — ABNORMAL HIGH (ref 70–99)
Glucose-Capillary: 173 mg/dL — ABNORMAL HIGH (ref 70–99)

## 2022-12-08 LAB — BASIC METABOLIC PANEL
Anion gap: 6 (ref 5–15)
BUN: 15 mg/dL (ref 6–20)
CO2: 21 mmol/L — ABNORMAL LOW (ref 22–32)
Calcium: 8 mg/dL — ABNORMAL LOW (ref 8.9–10.3)
Chloride: 107 mmol/L (ref 98–111)
Creatinine, Ser: 1.02 mg/dL (ref 0.61–1.24)
GFR, Estimated: >60 mL/min (ref >60)
Glucose, Bld: 99 mg/dL (ref 70–99)
Potassium: 3.6 mmol/L (ref 3.5–5.1)
Sodium: 134 mmol/L — ABNORMAL LOW (ref 135–145)

## 2022-12-08 LAB — URINE CULTURE: Culture: 700 — AB

## 2022-12-08 LAB — HEMOGLOBIN A1C
Hgb A1c MFr Bld: 7.9 % — ABNORMAL HIGH (ref 4.8–5.6)
Mean Plasma Glucose: 180 mg/dL

## 2022-12-08 MED ORDER — MENTHOL 3 MG MT LOZG
1.0000 | LOZENGE | OROMUCOSAL | Status: DC | PRN
  Administered 2022-12-09: 3 mg via ORAL
  Filled 2022-12-08: qty 9

## 2022-12-08 MED ORDER — GUAIFENESIN 100 MG/5ML PO LIQD
5.0000 mL | ORAL | Status: DC | PRN
  Administered 2022-12-08: 5 mL via ORAL
  Filled 2022-12-08 (×2): qty 10

## 2022-12-08 NOTE — Plan of Care (Signed)
  Problem: Education: Goal: Ability to describe self-care measures that may prevent or decrease complications (Diabetes Survival Skills Education) will improve Outcome: Progressing   Problem: Coping: Goal: Ability to adjust to condition or change in health will improve Outcome: Progressing   Problem: Fluid Volume: Goal: Ability to maintain a balanced intake and output will improve Outcome: Progressing   Problem: Metabolic: Goal: Ability to maintain appropriate glucose levels will improve Outcome: Progressing   Problem: Nutritional: Goal: Maintenance of adequate nutrition will improve Outcome: Progressing Goal: Progress toward achieving an optimal weight will improve Outcome: Progressing   Problem: Skin Integrity: Goal: Risk for impaired skin integrity will decrease Outcome: Progressing   Problem: Tissue Perfusion: Goal: Adequacy of tissue perfusion will improve Outcome: Progressing

## 2022-12-08 NOTE — Progress Notes (Signed)
Progress Note   Patient: Justin Hammond:[id number] DOB: 1965-10-03 DOA: 12/06/2022     1 DOS: the patient was seen and examined on 12/08/2022   Brief hospital course: 57 y.o. male with medical history significant for  CAD status post stent 2018 [facility]/ DM II/ obesity/ HTN coming for fever and chills that started today about 1 pm.  Patient also does report diarrhea for about a week.  No blood noted.  Patient is currently established with [facility] health urology Dr. Richardo Hammond for his bladder cancer/BPH with urinary obstruction symptoms.   7/19.  Had 2 episodes of diarrhea last night and 2 this am.  Stool studies positive for campylobacter and norovirus. Hydrate today and start zithromax. 1 BC positive for gram negative organism.  Restarted cefepime and flagyl. 7/20.  Patient feeling fine.  No further diarrhea.  Gram-negative rod in blood culture.  Awaiting sensitivity and organism.  Continue IV antibiotic.  Assessment and Plan: * Gram-negative bacteremia Gram-negative rod and aerobic bottle only. Continue cefepime and Flagyl.  Staph epidermidis in 1 bottle likely contaminant.  Campylobacter diarrhea Short course of zithromax  Norovirus Supportive care  Left kidney mass Seen on CT scan.  Will refer back to urology upon discharge.  Essential hypertension On coreg  Hyperlipidemia On lipitor  Obesity, Class III, BMI 40-49.9 (morbid obesity) (HCC) BMI 45.61  Type 2 diabetes mellitus with hyperlipidemia (HCC) Hold metformin with diarrhea  BPH (benign prostatic hyperplasia) On Avodart and Flomax  Hyponatremia Sodium 1 point less than normal range  Elevated LFTs Likely secondary to gastroenteritis.        Subjective: Patient feeling fine.  No further diarrhea.  No abdominal pain.  Came in with diarrhea and abdominal pain and found to have norovirus and Campylobacter in the stool cultures.  Positive blood culture with gram-negative  rod.  Physical Exam: Vitals:   12/07/22 1654 12/07/22 2030 12/07/22 2317 12/08/22 0737  BP: (!) 107/48  104/62 126/69  Pulse: 67  62 (!) 58  Resp: 16  20 17   Temp: 99.7 F (37.6 C)  98.5 F (36.9 C) 98.5 F (36.9 C)  TempSrc:      SpO2: 98% 98% 99% 99%  Weight:      Height:       Physical Exam HENT:     Head: Normocephalic.     Mouth/Throat:     Pharynx: No oropharyngeal exudate.  Eyes:     General: Lids are normal.     Conjunctiva/sclera: Conjunctivae normal.  Cardiovascular:     Rate and Rhythm: Normal rate and regular rhythm.     Heart sounds: Normal heart sounds, S1 normal and S2 normal.  Pulmonary:     Breath sounds: No decreased breath sounds, wheezing, rhonchi or rales.  Abdominal:     Palpations: Abdomen is soft.     Tenderness: There is no abdominal tenderness.  Musculoskeletal:     Right lower leg: No swelling.     Left lower leg: No swelling.  Skin:    General: Skin is warm.     Findings: No rash.  Neurological:     Mental Status: He is alert and oriented to person, place, and time.     Data Reviewed: Gram-negative rod in blood culture Sodium 134, creatinine 1.02, hemoglobin 13.5, white blood cell count 5.0  Family Communication: Permission to speak in front of family at bedside  Disposition: Status is: Inpatient Remains inpatient appropriate because: Still awaiting identification of gram-negative rod in  blood culture and sensitivities.  Planned Discharge Destination: Home    Time spent: 28 minutes  Author: Alford Highland, MD 12/08/2022 2:21 PM  For on call review [email or web address].

## 2022-12-08 NOTE — Plan of Care (Signed)

## 2022-12-09 DIAGNOSIS — R7881 Bacteremia: Secondary | ICD-10-CM | POA: Diagnosis not present

## 2022-12-09 DIAGNOSIS — A045 Campylobacter enteritis: Secondary | ICD-10-CM | POA: Diagnosis not present

## 2022-12-09 DIAGNOSIS — N2889 Other specified disorders of kidney and ureter: Secondary | ICD-10-CM | POA: Diagnosis not present

## 2022-12-09 DIAGNOSIS — A0811 Acute gastroenteropathy due to Norwalk agent: Secondary | ICD-10-CM | POA: Diagnosis not present

## 2022-12-09 LAB — GLUCOSE, CAPILLARY
Glucose-Capillary: 116 mg/dL — ABNORMAL HIGH (ref 70–99)
Glucose-Capillary: 135 mg/dL — ABNORMAL HIGH (ref 70–99)
Glucose-Capillary: 201 mg/dL — ABNORMAL HIGH (ref 70–99)
Glucose-Capillary: 163 mg/dL — ABNORMAL HIGH (ref 70–99)

## 2022-12-09 NOTE — Plan of Care (Signed)

## 2022-12-09 NOTE — Plan of Care (Signed)
  Problem: Education: Goal: Ability to describe self-care measures that may prevent or decrease complications (Diabetes Survival Skills Education) will improve Outcome: Progressing   Problem: Fluid Volume: Goal: Ability to maintain a balanced intake and output will improve Outcome: Progressing   Problem: Health Behavior/Discharge Planning: Goal: Ability to identify and utilize available resources and services will improve Outcome: Progressing   Problem: Metabolic: Goal: Ability to maintain appropriate glucose levels will improve Outcome: Progressing   Problem: Nutritional: Goal: Maintenance of adequate nutrition will improve Outcome: Progressing   Problem: Tissue Perfusion: Goal: Adequacy of tissue perfusion will improve Outcome: Progressing

## 2022-12-09 NOTE — Progress Notes (Signed)
Progress Note   Patient: Justin Hammond JXB:[id number] DOB: [birth year] DOA: 12/06/2022     2 DOS: the patient was seen and examined on 12/09/2022   Brief hospital course: 57 y.o. male with medical history significant for  CAD status post stent 2018 followed [facility]/ DM II/ obesity/ HTN coming for fever and chills that started today about 1 pm.  Patient also does report diarrhea for about a week.  No blood noted.  Patient is currently established with [facility] health urology Dr. Richardo Hammond for his bladder cancer/BPH with urinary obstruction symptoms.   7/19.  Had 2 episodes of diarrhea last night and 2 this am.  Stool studies positive for campylobacter and norovirus. Hydrate today and start zithromax. 1 BC positive for gram negative organism.  Restarted cefepime and flagyl. 7/20.  Patient feeling fine.  No further diarrhea.  Gram-negative rod in blood culture.  Awaiting sensitivity and organism.  Continue IV antibiotic. 7/21.  Pantoea agglomerans growing out of BC.  Staphylococcus epidermis likely contaminant from skin.  HIV test negative.  Patient does not use IV drugs.  Sensitivities will be back tomorrow hopefully.  Assessment and Plan: * Gram-negative bacteremia Pantoea agglomerans in blood cultures.  Continue cefepime and Flagyl.  Staph epidermidis likely contaminant.  Sensitivities likely back tomorrow.  Campylobacter diarrhea Short course of zithromax  Norovirus Supportive care  Left kidney mass Seen on CT scan.  Will refer back to urology upon discharge.  Essential hypertension On coreg  Hyperlipidemia On lipitor  Obesity, Class III, BMI 40-49.9 (morbid obesity) (HCC) BMI 45.61  Type 2 diabetes mellitus with hyperlipidemia (HCC) Hold metformin with diarrhea  BPH (benign prostatic hyperplasia) On Avodart and Flomax  Hyponatremia Sodium 1 point less than normal range  Elevated LFTs Likely secondary to gastroenteritis.        Subjective:  Patient did have a couple episodes of diarrhea.  Eating okay when he gets food that he likes.  Admitted with infectious diarrhea and found to have positive blood culture.  Physical Exam: Vitals:   12/08/22 1539 12/09/22 0101 12/09/22 0733 12/09/22 1443  BP: 136/72 116/72 128/69 123/65  Pulse: (!) 56 (!) 57 (!) 59 63  Resp: 17 18 16 17   Temp: 98.4 F (36.9 C) 98.3 F (36.8 C) 98.4 F (36.9 C) 98.2 F (36.8 C)  TempSrc:      SpO2: 100% 94% 94% 96%  Weight:      Height:       Physical Exam HENT:     Head: Normocephalic.     Mouth/Throat:     Pharynx: No oropharyngeal exudate.  Eyes:     General: Lids are normal.     Conjunctiva/sclera: Conjunctivae normal.  Cardiovascular:     Rate and Rhythm: Normal rate and regular rhythm.     Heart sounds: Normal heart sounds, S1 normal and S2 normal.  Pulmonary:     Breath sounds: No decreased breath sounds, wheezing, rhonchi or rales.  Abdominal:     Palpations: Abdomen is soft.     Tenderness: There is no abdominal tenderness.  Musculoskeletal:     Right lower leg: No swelling.     Left lower leg: No swelling.  Skin:    General: Skin is warm.     Findings: No rash.  Neurological:     Mental Status: He is alert and oriented to person, place, and time.     Data Reviewed: Pantoea agglomerans in blood cultures   Disposition: Status is: Inpatient Remains  inpatient appropriate because: Sensitivities on blood cultures will not be back until tomorrow.  Planned Discharge Destination: Home    Time spent: 28 minutes  Author: Alford Highland, MD 12/09/2022 3:34 PM  For on call review [email or web address].

## 2022-12-10 DIAGNOSIS — N2889 Other specified disorders of kidney and ureter: Secondary | ICD-10-CM | POA: Diagnosis not present

## 2022-12-10 DIAGNOSIS — R7881 Bacteremia: Secondary | ICD-10-CM | POA: Diagnosis not present

## 2022-12-10 DIAGNOSIS — A045 Campylobacter enteritis: Secondary | ICD-10-CM | POA: Diagnosis not present

## 2022-12-10 DIAGNOSIS — A0811 Acute gastroenteropathy due to Norwalk agent: Secondary | ICD-10-CM | POA: Diagnosis not present

## 2022-12-10 LAB — CBC
HCT: 38.2 % — ABNORMAL LOW (ref 39.0–52.0)
Hemoglobin: 13 g/dL (ref 13.0–17.0)
MCH: 27.6 pg (ref 26.0–34.0)
MCHC: 34 g/dL (ref 30.0–36.0)
MCV: 81.1 fL (ref 80.0–100.0)
Platelets: 214 10*3/uL (ref 150–400)
RBC: 4.71 MIL/uL (ref 4.22–5.81)
RDW: 15.3 % (ref 11.5–15.5)
WBC: 6.2 10*3/uL (ref 4.0–10.5)
nRBC: 0 % (ref 0.0–0.2)

## 2022-12-10 LAB — BASIC METABOLIC PANEL
Anion gap: 6 (ref 5–15)
BUN: 17 mg/dL (ref 6–20)
CO2: 23 mmol/L (ref 22–32)
Calcium: 8.3 mg/dL — ABNORMAL LOW (ref 8.9–10.3)
Chloride: 107 mmol/L (ref 98–111)
Creatinine, Ser: 0.8 mg/dL (ref 0.61–1.24)
GFR, Estimated: >60 mL/min (ref >60)
Glucose, Bld: 152 mg/dL — ABNORMAL HIGH (ref 70–99)
Potassium: 3.8 mmol/L (ref 3.5–5.1)
Sodium: 136 mmol/L (ref 135–145)

## 2022-12-10 LAB — CULTURE, BLOOD (ROUTINE X 2): Special Requests: ADEQUATE

## 2022-12-10 LAB — GLUCOSE, CAPILLARY
Glucose-Capillary: 184 mg/dL — ABNORMAL HIGH (ref 70–99)
Glucose-Capillary: 214 mg/dL — ABNORMAL HIGH (ref 70–99)

## 2022-12-10 MED ORDER — LEVOFLOXACIN 750 MG PO TABS: 750.0000 mg | ORAL_TABLET | Freq: Every day | ORAL | 0 refills | Status: DC

## 2022-12-10 MED ORDER — NICOTINE 14 MG/24HR TD PT24: MEDICATED_PATCH | TRANSDERMAL | 0 refills | Status: DC

## 2022-12-10 NOTE — Discharge Summary (Signed)
Physician Discharge Summary   Patient: Justin Hammond MRN: [id number] DOB: [birth year]  Admit date:     12/06/2022  Discharge date: 12/10/22  Discharge Physician: Alford Highland   PCP: Jaclyn Shaggy, MD   Recommendations at discharge:   Follow-up PCP 5 days Follow-up urology Dr. Signa Kell Follow-up with Dr. Cathie Hoops oncology  Discharge Diagnoses: Principal Problem:   Gram-negative bacteremia Active Problems:   Norovirus   Campylobacter diarrhea   Left kidney mass   Essential hypertension   Hyperlipidemia   Obesity, Class III, BMI 40-49.9 (morbid obesity) (HCC)   Elevated LFTs   Hyponatremia   BPH (benign prostatic hyperplasia)   Type 2 diabetes mellitus with hyperlipidemia [facility])    Hospital Course: 57 y.o. male with medical history significant for  CAD status post stent 2018 [name]/ DM II/ obesity/ HTN coming for fever and chills that started today about 1 pm.  Patient also does report diarrhea for about a week.  No blood noted.  Patient is currently established with [facility] health urology Dr. Richardo Hanks for his bladder cancer/BPH with urinary obstruction symptoms.   7/19.  Had 2 episodes of diarrhea last night and 2 this am.  Stool studies positive for campylobacter and norovirus. Hydrate today and start zithromax. 1 BC positive for gram negative organism.  Restarted cefepime and flagyl. 7/20.  Patient feeling fine.  No further diarrhea.  Gram-negative rod in blood culture.  Awaiting sensitivity and organism.  Continue IV antibiotic. 7/21.  Pantoea agglomerans growing out of BC.  Staphylococcus epidermis likely contaminant from skin.  HIV test negative.  Patient does not use IV drugs.  Sensitivities will be back tomorrow hopefully. 7/22.  Sensitivities back on Pantoea Agglomerans.  Case discussed with infectious disease pharmacist and we will give Maxipime here prior to disposition and then 5 more days of oral Levaquin high-dose upon  discharge.  Assessment and Plan: * Gram-negative bacteremia Pantoea agglomerans in blood cultures.  Will give another dose of Maxipime prior to disposition and 5 doses of high-dose Levaquin starting this evening.  Staph epidermidis likely contaminant.  Campylobacter diarrhea Completed short course of zithromax  Norovirus Supportive care  Left kidney mass Seen on CT scan.  Will refer back to urology upon discharge.  Case discussed with Dr. Richardo Hanks while he was here and he was okay with following up as outpatient.  Essential hypertension On coreg  Hyperlipidemia On lipitor  Obesity, Class III, BMI 40-49.9 (morbid obesity) (HCC) BMI 44.01  Type 2 diabetes mellitus with hyperlipidemia (HCC) Hold metformin with diarrhea.  Hold glimepiride while on Levaquin.  Can go back on other diabetic medications.  Last hemoglobin A1c 7.9  BPH (benign prostatic hyperplasia) On Avodart and Flomax  Hyponatremia Sodium normal upon discharge  Elevated LFTs Likely secondary to gastroenteritis.         Consultants: Case discussed with infectious disease pharmacist and ID. Procedures performed: None Disposition: Home Diet recommendation:  Cardiac and Carb modified diet DISCHARGE MEDICATION: Allergies as of 12/10/2022       Reactions   Penicillins Itching   Tolerated cefepime 11/2022        Medication List     STOP taking these medications    Gemtesa 75 MG Tabs Generic drug: Vibegron   glimepiride 4 MG tablet Commonly known as: AMARYL   metFORMIN 500 MG tablet Commonly known as: GLUCOPHAGE   omeprazole 20 MG tablet Commonly known as: PriLOSEC OTC       TAKE these medications  atorvastatin 40 MG tablet Commonly known as: LIPITOR Take 1 tablet (40 mg total) by mouth daily.   carvedilol 3.125 MG tablet Commonly known as: COREG Take 1 tablet (3.125 mg total) by mouth 2 (two) times daily with a meal.   dutasteride 0.5 MG capsule Commonly known as:  AVODART Take 1 capsule (0.5 mg total) by mouth daily.   gabapentin 400 MG capsule Commonly known as: NEURONTIN Take 1 capsule by mouth 3 (three) times daily.   Jardiance 25 MG Tabs tablet Generic drug: empagliflozin Take 25 mg by mouth every morning.   levofloxacin 750 MG tablet Commonly known as: Levaquin Take 1 tablet (750 mg total) by mouth at bedtime.   losartan 100 MG tablet Commonly known as: COZAAR Take 1 tablet by mouth daily.   nicotine 14 mg/24hr patch Commonly known as: NICODERM CQ - dosed in mg/24 hours One 14 mg patch chest wall daily (okay to substitute generic) Start taking on: December 11, 2022   omeprazole 40 MG capsule Commonly known as: PRILOSEC Take 40 mg by mouth daily.   Ozempic (1 MG/DOSE) 4 MG/3ML Sopn Generic drug: Semaglutide (1 MG/DOSE) Inject 1 mg into the skin.   tamsulosin 0.4 MG Caps capsule Commonly known as: FLOMAX Take 1 capsule (0.4 mg total) by mouth daily.        Follow-up Information     Sondra Come, MD. Go on 12/24/2022.   Specialty: Urology Why: Kidney mass seen on ct scan;Appt @ 10:30 am ;  w/ Dr. Lonna Cobb. Contact information: [address] [place] [address] [phone]         Jaclyn Shaggy, MD. Go on 12/17/2022.   Specialty: Internal Medicine Why: Appt @ 3:45 pm Contact information: [address]   [place] [address] [phone]         Rickard Patience, MD. Go on 12/25/2022.   Specialty: Oncology Why: Appt @ 11:15 am Contact information: [address] [place] [address] [phone]                Discharge Exam: Ceasar Mons Weights   12/06/22 1608 12/10/22 0417  Weight: 136.1 kg 131.3 kg   Physical Exam HENT:     Head: Normocephalic.     Mouth/Throat:     Pharynx: No oropharyngeal exudate.  Eyes:     General: Lids are normal.     Conjunctiva/sclera: Conjunctivae normal.  Cardiovascular:     Rate and Rhythm: Normal rate and regular rhythm.     Heart sounds: Normal  heart sounds, S1 normal and S2 normal.  Pulmonary:     Breath sounds: No decreased breath sounds, wheezing, rhonchi or rales.  Abdominal:     Palpations: Abdomen is soft.     Tenderness: There is no abdominal tenderness.  Musculoskeletal:     Right lower leg: No swelling.     Left lower leg: No swelling.  Skin:    General: Skin is warm.     Findings: No rash.  Neurological:     Mental Status: He is alert and oriented to person, place, and time.      Condition at discharge: stable  The results of significant diagnostics from this hospitalization (including imaging, microbiology, ancillary and laboratory) are listed below for reference.   Imaging Studies: CT ABDOMEN PELVIS W CONTRAST  Result Date: 12/06/2022 CLINICAL DATA:  Diarrhea and left lower quadrant pain. Fever, chills, and weakness. EXAM: CT ABDOMEN AND PELVIS WITH CONTRAST TECHNIQUE: Multidetector CT imaging of  the abdomen and pelvis was performed using the standard protocol following bolus administration of intravenous contrast. RADIATION DOSE REDUCTION: This exam was performed according to the departmental dose-optimization program which includes automated exposure control, adjustment of the mA and/or kV according to patient size and/or use of iterative reconstruction technique. CONTRAST:  OMNIPAQUE IOHEXOL 300 MG/ML  SOLN COMPARISON:  Ultrasound 10/09/2021.  CT chest 12/27/2016 FINDINGS: Lower chest: Lung bases are clear. Hepatobiliary: No focal liver lesions. Cholelithiasis. No gallbladder wall thickening or stranding. No bile duct dilatation. Pancreas: Unremarkable. No pancreatic ductal dilatation or surrounding inflammatory changes. Spleen: Normal in size without focal abnormality. Adrenals/Urinary Tract: No adrenal gland nodules. Large heterogeneously enhancing mass in the anterior left kidney measuring 8.3 by 8.4 x 10.2 cm in diameter. Appearances consistent with renal cell carcinoma. No definite tumor thrombus. No  suggestion of local extension or adjacent lymphadenopathy. No hydronephrosis or hydroureter. The right kidney and bladder are normal. Stomach/Bowel: Stomach is within normal limits. Appendix is not identified. No evidence of bowel wall thickening, distention, or inflammatory changes. Vascular/Lymphatic: No significant vascular findings are present. No enlarged abdominal or pelvic lymph nodes. Reproductive: Prostate is unremarkable. Other: No abdominal wall hernia or abnormality. No abdominopelvic ascites. Musculoskeletal: Degenerative changes in the spine. No focal bone lesions. IMPRESSION: 1. 10.2 cm diameter solid mass in the left kidney consistent with renal cell carcinoma. No metastatic disease is identified. 2. Cholelithiasis without evidence of acute cholecystitis. 3. No evidence of bowel obstruction or inflammation. Electronically Signed   By: Burman Nieves M.D.   On: 12/06/2022 18:28   DG Chest 2 View  Result Date: 12/06/2022 CLINICAL DATA:  Fever of unknown origin EXAM: CHEST - 2 VIEW COMPARISON:  Chest radiograph dated 10/09/2021 FINDINGS: Normal lung volumes. No focal consolidations. No pleural effusion or pneumothorax. The heart size and mediastinal contours are within normal limits. No acute osseous abnormality. IMPRESSION: No active cardiopulmonary disease. Electronically Signed   By: Agustin Cree M.D.   On: 12/06/2022 16:47    Microbiology: Results for orders placed or performed during the hospital encounter of 12/06/22  Resp panel by RT-PCR (RSV, Flu A&B, Covid) Anterior Nasal Swab     Status: None   Collection Time: 12/06/22  4:22 PM   Specimen: Anterior Nasal Swab  Result Value Ref Range Status   SARS Coronavirus 2 by RT PCR NEGATIVE NEGATIVE Final    Comment: (NOTE) SARS-CoV-2 target nucleic acids are NOT DETECTED.  The SARS-CoV-2 RNA is generally detectable in upper respiratory specimens during the acute phase of infection. The lowest concentration of SARS-CoV-2 viral copies  this assay can detect is 138 copies/mL. A negative result does not preclude SARS-Cov-2 infection and should not be used as the sole basis for treatment or other patient management decisions. A negative result may occur with  improper specimen collection/handling, submission of specimen other than nasopharyngeal swab, presence of viral mutation(s) within the areas targeted by this assay, and inadequate number of viral copies(<138 copies/mL). A negative result must be combined with clinical observations, patient history, and epidemiological information. The expected result is Negative.  Fact Sheet for Patients:  BloggerCourse.com  Fact Sheet for Healthcare Providers:  SeriousBroker.it  This test is no t yet approved or cleared by the [place] FDA and  has been authorized for detection and/or diagnosis of SARS-CoV-2 by FDA under an Emergency Use Authorization (EUA). This EUA will remain  in effect (meaning this test can be used) for the duration of the COVID-19 declaration under Section  564(b)(1) of the Act, 21 U.S.C.section 360bbb-3(b)(1), unless the authorization is terminated  or revoked sooner.       Influenza A by PCR NEGATIVE NEGATIVE Final   Influenza B by PCR NEGATIVE NEGATIVE Final    Comment: (NOTE) The Xpert Xpress SARS-CoV-2/FLU/RSV plus assay is intended as an aid in the diagnosis of influenza from Nasopharyngeal swab specimens and should not be used as a sole basis for treatment. Nasal washings and aspirates are unacceptable for Xpert Xpress SARS-CoV-2/FLU/RSV testing.  Fact Sheet for Patients: BloggerCourse.com  Fact Sheet for Healthcare Providers: SeriousBroker.it  This test is not yet approved or cleared by the [place] FDA and has been authorized for detection and/or diagnosis of SARS-CoV-2 by FDA under an Emergency Use Authorization (EUA). This EUA  will remain in effect (meaning this test can be used) for the duration of the COVID-19 declaration under Section 564(b)(1) of the Act, 21 U.S.C. section 360bbb-3(b)(1), unless the authorization is terminated or revoked.     Resp Syncytial Virus by PCR NEGATIVE NEGATIVE Final    Comment: (NOTE) Fact Sheet for Patients: BloggerCourse.com  Fact Sheet for Healthcare Providers: SeriousBroker.it  This test is not yet approved or cleared by the [place] FDA and has been authorized for detection and/or diagnosis of SARS-CoV-2 by FDA under an Emergency Use Authorization (EUA). This EUA will remain in effect (meaning this test can be used) for the duration of the COVID-19 declaration under Section 564(b)(1) of the Act, 21 U.S.C. section 360bbb-3(b)(1), unless the authorization is terminated or revoked.  Performed at [facility], [address], [place], [place] [address]   Urine Culture     Status: Abnormal   Collection Time: 12/06/22  4:22 PM   Specimen: Urine, Clean Catch  Result Value Ref Range Status   Specimen Description   Final    URINE, CLEAN CATCH Performed at [facility], [address] [place], [place] [address]    Special Requests   Final    NONE Performed at [facility], [address] [place], [place] [address]    Culture (A)  Final    700 COLONIES/mL STREPTOCOCCUS AGALACTIAE TESTING AGAINST S. AGALACTIAE NOT ROUTINELY PERFORMED DUE TO PREDICTABILITY OF AMP/PEN/VAN SUSCEPTIBILITY. Performed at [facility], [address], [place], [place] [address]    Report Status 12/08/2022 FINAL  Final  Blood culture (routine x 2)     Status: None (Preliminary result)   Collection Time: 12/06/22  8:00 PM   Specimen: BLOOD  Result Value Ref Range Status   Specimen Description BLOOD BLOOD RIGHT ARM  Final   Special Requests   Final    BOTTLES DRAWN AEROBIC AND ANAEROBIC Blood Culture  adequate volume   Culture   Final    NO GROWTH 4 DAYS Performed at [facility], [address] [place], [place] [address]    Report Status PENDING  Incomplete  Blood culture (routine x 2)     Status: Abnormal   Collection Time: 12/06/22  8:05 PM   Specimen: BLOOD LEFT ARM  Result Value Ref Range Status   Specimen Description   Final    BLOOD LEFT ARM Performed at [facility], [address], [place], [place] [address]    Special Requests   Final    BOTTLES DRAWN AEROBIC AND ANAEROBIC Blood Culture adequate volume Performed at [facility], [address], [place], [place] [address]    Culture  Setup Time   Final  GRAM POSITIVE COCCI GRAM NEGATIVE RODS IN BOTH AEROBIC AND ANAEROBIC BOTTLES CRITICAL RESULT CALLED TO, READ BACK BY AND VERIFIED WITH: EMILY SPEINBOCK 12/07/22 1402 MW GPC only in Aerobic bottle Performed at [facility], [address] [place], [place] [address]    Culture (A)  Final    PANTOEA AGGLOMERANS STAPHYLOCOCCUS EPIDERMIDIS STAPHYLOCOCCUS CAPITIS THE SIGNIFICANCE OF ISOLATING THIS ORGANISM FROM A SINGLE SET OF BLOOD CULTURES WHEN MULTIPLE SETS ARE DRAWN IS UNCERTAIN. PLEASE NOTIFY THE MICROBIOLOGY DEPARTMENT WITHIN ONE WEEK IF SPECIATION AND SENSITIVITIES ARE REQUIRED. Performed at [facility], [address] [place], [place] [address]    Report Status 12/10/2022 FINAL  Final   Organism ID, Bacteria PANTOEA AGGLOMERANS  Final      Susceptibility   Pantoea agglomerans - MIC*    CEFEPIME <=0.12 SENSITIVE Sensitive     CEFTAZIDIME 4 SENSITIVE Sensitive     CEFTRIAXONE 16 RESISTANT Resistant     CIPROFLOXACIN <=0.25 SENSITIVE Sensitive     GENTAMICIN <=1 SENSITIVE Sensitive     IMIPENEM <=0.25 SENSITIVE Sensitive     TRIMETH/SULFA 40 SENSITIVE Sensitive     PIP/TAZO <=4 SENSITIVE Sensitive     * PANTOEA AGGLOMERANS  Blood Culture ID Panel (Reflexed)     Status: Abnormal   Collection Time: 12/06/22  8:05 PM   Result Value Ref Range Status   Enterococcus faecalis NOT DETECTED NOT DETECTED Final   Enterococcus Faecium NOT DETECTED NOT DETECTED Final   Listeria monocytogenes NOT DETECTED NOT DETECTED Final   Staphylococcus species DETECTED (A) NOT DETECTED Final    Comment: CRITICAL RESULT CALLED TO, READ BACK BY AND VERIFIED WITH: EMILY STEINBOCK 12/07/22 1402 MW    Staphylococcus aureus (BCID) NOT DETECTED NOT DETECTED Final   Staphylococcus epidermidis DETECTED (A) NOT DETECTED Final    Comment: Methicillin (oxacillin) resistant coagulase negative staphylococcus. Possible blood culture contaminant (unless isolated from more than one blood culture draw or clinical case suggests pathogenicity). No antibiotic treatment is indicated for blood  culture contaminants. CRITICAL RESULT CALLED TO, READ BACK BY AND VERIFIED WITH: EMILY SPEINBOCK 12/07/22 1402 MW    Staphylococcus lugdunensis NOT DETECTED NOT DETECTED Final   Streptococcus species NOT DETECTED NOT DETECTED Final   Streptococcus agalactiae NOT DETECTED NOT DETECTED Final   Streptococcus pneumoniae NOT DETECTED NOT DETECTED Final   Streptococcus pyogenes NOT DETECTED NOT DETECTED Final   A.calcoaceticus-baumannii NOT DETECTED NOT DETECTED Final   Bacteroides fragilis NOT DETECTED NOT DETECTED Final   Enterobacterales DETECTED (A) NOT DETECTED Final    Comment: Enterobacterales represent a large order of gram negative bacteria, not a single organism. Refer to culture for further identification. CRITICAL RESULT CALLED TO, READ BACK BY AND VERIFIED WITH: EMILY SPEINBOCK 12/07/22 1402 MW    Enterobacter cloacae complex NOT DETECTED NOT DETECTED Final   Escherichia coli NOT DETECTED NOT DETECTED Final   Klebsiella aerogenes NOT DETECTED NOT DETECTED Final   Klebsiella oxytoca NOT DETECTED NOT DETECTED Final   Klebsiella pneumoniae NOT DETECTED NOT DETECTED Final   Proteus species NOT DETECTED NOT DETECTED Final   Salmonella species NOT  DETECTED NOT DETECTED Final   Serratia marcescens NOT DETECTED NOT DETECTED Final   Haemophilus influenzae NOT DETECTED NOT DETECTED Final   Neisseria meningitidis NOT DETECTED NOT DETECTED Final   Pseudomonas aeruginosa NOT DETECTED NOT DETECTED Final   Stenotrophomonas maltophilia NOT DETECTED NOT DETECTED Final   Candida albicans NOT DETECTED NOT DETECTED Final   Candida auris NOT DETECTED NOT DETECTED Final  Candida glabrata NOT DETECTED NOT DETECTED Final   Candida krusei NOT DETECTED NOT DETECTED Final   Candida parapsilosis NOT DETECTED NOT DETECTED Final   Candida tropicalis NOT DETECTED NOT DETECTED Final   Cryptococcus neoformans/gattii NOT DETECTED NOT DETECTED Final   CTX-M ESBL NOT DETECTED NOT DETECTED Final   Carbapenem resistance IMP NOT DETECTED NOT DETECTED Final   Carbapenem resistance KPC NOT DETECTED NOT DETECTED Final   Methicillin resistance mecA/C DETECTED (A) NOT DETECTED Final    Comment: CRITICAL RESULT CALLED TO, READ BACK BY AND VERIFIED WITH: EMILY SPEINBOCK 12/07/22 1402 MW    Carbapenem resistance NDM NOT DETECTED NOT DETECTED Final   Carbapenem resist OXA 48 LIKE NOT DETECTED NOT DETECTED Final   Carbapenem resistance VIM NOT DETECTED NOT DETECTED Final    Comment: Performed at [facility], [address] [place], [place] [address]  Gastrointestinal Panel by PCR , Stool     Status: Abnormal   Collection Time: 12/07/22  8:26 AM   Specimen: Stool  Result Value Ref Range Status   Campylobacter species DETECTED (A) NOT DETECTED Final    Comment: RESULT CALLED TO, READ BACK BY AND VERIFIED WITH: LEN BENNET 12/07/22 1014 MW    Plesimonas shigelloides NOT DETECTED NOT DETECTED Final   Salmonella species NOT DETECTED NOT DETECTED Final   Yersinia enterocolitica NOT DETECTED NOT DETECTED Final   Vibrio species NOT DETECTED NOT DETECTED Final   Vibrio cholerae NOT DETECTED NOT DETECTED Final   Enteroaggregative E coli (EAEC) NOT DETECTED NOT  DETECTED Final   Enteropathogenic E coli (EPEC) NOT DETECTED NOT DETECTED Final   Enterotoxigenic E coli (ETEC) NOT DETECTED NOT DETECTED Final   Shiga like toxin producing E coli (STEC) NOT DETECTED NOT DETECTED Final   Shigella/Enteroinvasive E coli (EIEC) NOT DETECTED NOT DETECTED Final   Cryptosporidium NOT DETECTED NOT DETECTED Final   Cyclospora cayetanensis NOT DETECTED NOT DETECTED Final   Entamoeba histolytica NOT DETECTED NOT DETECTED Final   Giardia lamblia NOT DETECTED NOT DETECTED Final   Adenovirus F40/41 NOT DETECTED NOT DETECTED Final   Astrovirus NOT DETECTED NOT DETECTED Final   Norovirus GI/GII DETECTED (A) NOT DETECTED Final    Comment: RESULT CALLED TO, READ BACK BY AND VERIFIED WITH: LEN BENNET 12/07/22 1014 MW    Rotavirus A NOT DETECTED NOT DETECTED Final   Sapovirus (I, II, IV, and V) NOT DETECTED NOT DETECTED Final    Comment: Performed at [facility], [address] [place], [place] [address]    Labs: CBC: Recent Labs  Lab 12/06/22 1623 12/07/22 0922 12/08/22 0501 12/10/22 0438  WBC 4.1 3.9* 5.0 6.2  NEUTROABS 3.2  --  3.3  --   HGB 14.0 14.2 13.5 13.0  HCT 42.0 42.2 41.1 38.2*  MCV 84.5 83.4 83.9 81.1  PLT 199 186 201 214   Basic Metabolic Panel: Recent Labs  Lab 12/06/22 1623 12/07/22 0922 12/08/22 0501 12/10/22 0438  NA 131* 135 134* 136  K 3.6 4.0 3.6 3.8  CL 104 106 107 107  CO2 21* 21* 21* 23  GLUCOSE 125* 88 99 152*  BUN 18 15 15 17   CREATININE 1.23 0.95 1.02 0.80  CALCIUM 7.7* 7.9* 8.0* 8.3*   Liver Function Tests: Recent Labs  Lab 12/06/22 1623 12/07/22 0922  AST 42* 47*  ALT 41 48*  ALKPHOS 90 78  BILITOT 1.1 0.6  PROT 6.5 6.4*  ALBUMIN 3.3* 3.3*   CBG: Recent Labs  Lab 12/09/22 1125 12/09/22  1636 12/09/22 2109 12/10/22 0810 12/10/22 1143  GLUCAP 135* 201* 163* 184* 214*    Discharge time spent: greater than 30 minutes.  Signed: Alford Highland, MD [facility] 12/10/2022

## 2022-12-10 NOTE — Plan of Care (Signed)
  Problem: Education: Goal: Ability to describe self-care measures that may prevent or decrease complications (Diabetes Survival Skills Education) will improve Outcome: Progressing Goal: Individualized Educational Video(s) Outcome: Progressing   Problem: Coping: Goal: Ability to adjust to condition or change in health will improve Outcome: Progressing   Problem: Fluid Volume: Goal: Ability to maintain a balanced intake and output will improve Outcome: Progressing   Problem: Health Behavior/Discharge Planning: Goal: Ability to identify and utilize available resources and services will improve Outcome: Progressing Goal: Ability to manage health-related needs will improve Outcome: Progressing   Problem: Metabolic: Goal: Ability to maintain appropriate glucose levels will improve Outcome: Progressing   Problem: Skin Integrity: Goal: Risk for impaired skin integrity will decrease Outcome: Progressing   Problem: Tissue Perfusion: Goal: Adequacy of tissue perfusion will improve Outcome: Progressing   Problem: Education: Goal: Knowledge of General Education information will improve Description: Including pain rating scale, medication(s)/side effects and non-pharmacologic comfort measures Outcome: Progressing   Problem: Health Behavior/Discharge Planning: Goal: Ability to manage health-related needs will improve Outcome: Progressing   Problem: Clinical Measurements: Goal: Ability to maintain clinical measurements within normal limits will improve Outcome: Progressing Goal: Will remain free from infection Outcome: Progressing Goal: Diagnostic test results will improve Outcome: Progressing Goal: Respiratory complications will improve Outcome: Progressing Goal: Cardiovascular complication will be avoided Outcome: Progressing   Problem: Activity: Goal: Risk for activity intolerance will decrease Outcome: Progressing   Problem: Nutrition: Goal: Adequate nutrition will be  maintained Outcome: Progressing   Problem: Coping: Goal: Level of anxiety will decrease Outcome: Progressing   Problem: Elimination: Goal: Will not experience complications related to bowel motility Outcome: Progressing Goal: Will not experience complications related to urinary retention Outcome: Progressing   Problem: Pain Managment: Goal: General experience of comfort will improve Outcome: Progressing   Problem: Safety: Goal: Ability to remain free from injury will improve Outcome: Progressing   Problem: Skin Integrity: Goal: Risk for impaired skin integrity will decrease Outcome: Progressing

## 2022-12-10 NOTE — Progress Notes (Signed)
DISCHARGE NOTE:   Pt discharged home. Iv removed and education provided. No further questions or concerns. Pt's family friends providing transportation. Pt wheeled down to medical mall.

## 2022-12-14 ENCOUNTER — Inpatient Hospital Stay: Payer: BC Managed Care – PPO

## 2022-12-14 ENCOUNTER — Inpatient Hospital Stay: Payer: BC Managed Care – PPO | Admitting: Oncology

## 2022-12-24 ENCOUNTER — Ambulatory Visit: Payer: BC Managed Care – PPO | Admitting: Urology

## 2022-12-24 ENCOUNTER — Encounter: Payer: Self-pay | Admitting: Urology

## 2022-12-24 VITALS — BP 94/58 | HR 67 | Wt 300.0 lb

## 2022-12-24 DIAGNOSIS — N2889 Other specified disorders of kidney and ureter: Secondary | ICD-10-CM | POA: Diagnosis not present

## 2022-12-24 NOTE — Progress Notes (Unsigned)
I,Dina M Abdulla,acting as a scribe for Riki Altes, MD.,have documented all relevant documentation on the behalf of Riki Altes, MD,as directed by  Riki Altes, MD while in the presence of Riki Altes, MD.  12/24/2022 4:56 PM   [name] 08/31/1965 [id number]  Referring provider: Jaclyn Shaggy, MD [address]   [place],  [place] [address]  Chief Complaint  Patient presents with   Hospital f/up    Urologic history: 1.  BPH with LUTS TUMT 2009; 2018 Combination therapy tamsulosin/dutasteride  2.  History urothelial carcinoma bladder TURBT 2007; path Ta low-grade; no recurrences   HPI: [place] is a 57 y.o. male presents for follow up of recent hospitalization.  Initially seen by me 07/11/2022 for BPH and history of urothelial carcinoma of the bladder. Follow up visit with [facility] April 2024 with worsening LUTS and PVR 177 mL. Cystoscopy completed with prominent lateral lobe enlargement and he saw Dr. Richardo Hanks 11/19/22 to discuss HoLEP which was tentatively scheduled in November. Admitted to [facility] from 12/06/2022 to 12/10/2022 for gram negative bacteremia growing Pantoea agglomerans. A CT abdomen/pelvis with contrast was performed during that hospitalization, which incidentily showed an 8.3 X 8.4 X 10.2 cm left renal mass felt consistent with a renal cell carcinoma. He has no complaints since his hospitalization.   PMH: Past Medical History:  Diagnosis Date   Cancer (HCC)    bladder cancer   Diabetes mellitus without complication (HCC)    Hyperlipidemia    Proteinuria     Surgical History: Past Surgical History:  Procedure Laterality Date   APPENDECTOMY     CORONARY STENT INTERVENTION N/A 12/28/2016   Procedure: CORONARY STENT INTERVENTION;  Surgeon: Dalia Heading, MD;  Location: [facility] INVASIVE CV LAB;  Service: Cardiovascular;  Laterality: N/A;   CORONARY STENT INTERVENTION N/A 12/28/2016   Procedure: CORONARY STENT  INTERVENTION;  Surgeon: Marcina Millard, MD;  Location: [facility] INVASIVE CV LAB;  Service: Cardiovascular;  Laterality: N/A;   LEFT HEART CATH AND CORONARY ANGIOGRAPHY N/A 12/28/2016   Procedure: LEFT HEART CATH AND CORONARY ANGIOGRAPHY;  Surgeon: Dalia Heading, MD;  Location: [facility] INVASIVE CV LAB;  Service: Cardiovascular;  Laterality: N/A;    Home Medications:  Allergies as of 12/24/2022       Reactions   Penicillins Itching   Tolerated cefepime 11/2022        Medication List        Accurate as of December 24, 2022  4:56 PM. If you have any questions, ask your nurse or doctor.          atorvastatin 40 MG tablet Commonly known as: LIPITOR Take 1 tablet (40 mg total) by mouth daily.   carvedilol 3.125 MG tablet Commonly known as: COREG Take 1 tablet (3.125 mg total) by mouth 2 (two) times daily with a meal.   dutasteride 0.5 MG capsule Commonly known as: AVODART Take 1 capsule (0.5 mg total) by mouth daily.   gabapentin 400 MG capsule Commonly known as: NEURONTIN Take 1 capsule by mouth 3 (three) times daily.   Jardiance 25 MG Tabs tablet Generic drug: empagliflozin Take 25 mg by mouth every morning.   levofloxacin 750 MG tablet Commonly known as: Levaquin Take 1 tablet (750 mg total) by mouth at bedtime.   losartan 100 MG tablet Commonly known as: COZAAR Take 1 tablet by mouth daily.   nicotine 14 mg/24hr patch Commonly known as: NICODERM CQ - dosed in mg/24  hours One 14 mg patch chest wall daily (okay to substitute generic)   omeprazole 40 MG capsule Commonly known as: PRILOSEC Take 40 mg by mouth daily.   Ozempic (1 MG/DOSE) 4 MG/3ML Sopn Generic drug: Semaglutide (1 MG/DOSE) Inject 1 mg into the skin.   tamsulosin 0.4 MG Caps capsule Commonly known as: FLOMAX Take 1 capsule (0.4 mg total) by mouth daily.        Allergies:  Allergies  Allergen Reactions   Penicillins Itching    Tolerated cefepime 11/2022    Family History: Family  History  Problem Relation Age of Onset   Breast cancer Mother    CAD Father     Social History:  reports that he has never smoked. He has never used smokeless tobacco. He reports that he does not drink alcohol and does not use drugs.   Physical Exam: BP (!) 94/58   Pulse 67   Wt 300 lb (136.1 kg)   BMI 45.61 kg/m   Constitutional:  Alert, No acute distress. HEENT: [place] AT Respiratory: Normal respiratory effort, no increased work of breathing. Psychiatric: Normal mood and affect.   Assessment & Plan:    1. Left renal mass We discussed CT is suspicious for renal cell carcinoma and his best treatment would be nephrectomy. A chest x-ray performed in that hospitalization was negative. Will discuss with Dr. Richardo Hanks regarding tertiary referral for nephrectomy vs. performing locally.   I have reviewed the above documentation for accuracy and completeness, and I agree with the above.   Riki Altes, MD  [facility] [address] [place], [place] [address] [phone]

## 2022-12-25 ENCOUNTER — Inpatient Hospital Stay: Payer: BC Managed Care – PPO | Admitting: Oncology

## 2022-12-26 ENCOUNTER — Telehealth: Payer: Self-pay

## 2022-12-26 NOTE — Telephone Encounter (Signed)
Call left on triage line   Pt would like to know if he needs to see the CA dr and [facility]. It's a 65.00 copay to see a specialist. Is it necessary to see both?   Advise pt he has a renal mass an f/u with ca center is necessary.  Pt voiced understanding.

## 2022-12-27 ENCOUNTER — Encounter: Payer: Self-pay | Admitting: Urology

## 2022-12-28 ENCOUNTER — Telehealth: Payer: Self-pay | Admitting: *Deleted

## 2022-12-28 NOTE — Telephone Encounter (Signed)
-----   Message from Verna Czech [facility] sent at 12/28/2022  8:12 AM EDT ----- Regarding: Referral Would you let [place] know he will need to be referred for his kidney mass.  I have contacted [facility] in [place] and they will be calling him to schedule an appointment.  Thanks

## 2022-12-28 NOTE — Telephone Encounter (Signed)
Notified patient as instructed, patient pleased °

## 2022-12-31 ENCOUNTER — Encounter: Payer: Self-pay | Admitting: Internal Medicine

## 2022-12-31 ENCOUNTER — Inpatient Hospital Stay: Payer: BC Managed Care – PPO

## 2022-12-31 ENCOUNTER — Inpatient Hospital Stay: Payer: BC Managed Care – PPO | Admitting: Internal Medicine

## 2022-12-31 VITALS — BP 123/56 | HR 60 | Temp 98.8°F | Wt 292.0 lb

## 2022-12-31 DIAGNOSIS — N2889 Other specified disorders of kidney and ureter: Secondary | ICD-10-CM

## 2022-12-31 DIAGNOSIS — E119 Type 2 diabetes mellitus without complications: Secondary | ICD-10-CM | POA: Insufficient documentation

## 2022-12-31 DIAGNOSIS — G4733 Obstructive sleep apnea (adult) (pediatric): Secondary | ICD-10-CM | POA: Insufficient documentation

## 2022-12-31 DIAGNOSIS — Z955 Presence of coronary angioplasty implant and graft: Secondary | ICD-10-CM | POA: Diagnosis not present

## 2022-12-31 DIAGNOSIS — Z79899 Other long term (current) drug therapy: Secondary | ICD-10-CM | POA: Insufficient documentation

## 2022-12-31 DIAGNOSIS — Z7984 Long term (current) use of oral hypoglycemic drugs: Secondary | ICD-10-CM | POA: Diagnosis not present

## 2022-12-31 DIAGNOSIS — I2510 Atherosclerotic heart disease of native coronary artery without angina pectoris: Secondary | ICD-10-CM | POA: Insufficient documentation

## 2022-12-31 DIAGNOSIS — I10 Essential (primary) hypertension: Secondary | ICD-10-CM | POA: Diagnosis not present

## 2022-12-31 DIAGNOSIS — E785 Hyperlipidemia, unspecified: Secondary | ICD-10-CM | POA: Insufficient documentation

## 2022-12-31 NOTE — Patient Instructions (Signed)
Please  call us with date of surgery- re: follow up. Thanks-

## 2022-12-31 NOTE — Progress Notes (Signed)
Patient says that he feels pretty good, he really just wants to know why he is here and what happens next.

## 2022-12-31 NOTE — Assessment & Plan Note (Addendum)
#   AUG 2024- [incidental]Large heterogeneously enhancing mass in the anterior left kidney measuring 8.3 by 8.4 x 10.2 cm in diameter. Appearances consistent with renal cell carcinoma. No definite tumor thrombus. No suggestion of local extension or adjacent lymphadenopathy. No hydronephrosis or hydroureter. The right kidney and bladder are normal.  # Patient is currently symptomatic from his underlying kidney mass.  Again very suspicious for malignancy.  Agree with urology recommendation for upfront surgery.  Currently awaiting evaluation with surgery/urology in [place] next week on 8/22.   # Discussed the possible role of adjuvant therapy/immunotherapy based upon final pathology.  However reassuring with no evidence of obvious malignancy outside the kidney at this time.  Patient will also need chest CT down the line.   # Superficial bladder cancer: [Dr.Sninski]  # CAD- stable.  # Obesity/OSA  Thank you Dr.Weiting for allowing me to participate in the care of your pleasant patient. Please do not hesitate to contact me with questions or concerns in the interim. Patient instructed to call us with date of surgery- re: follo w up er: discussion re: adjuvant therapy.  Written instructions were given.  # DISPOSITION:  # no labs today # follow up TBD- Dr.B  # I reviewed the blood work- with the patient in detail; also reviewed the imaging independently [as summarized above]; and with the patient in detail.

## 2022-12-31 NOTE — Progress Notes (Signed)
[facility] CONSULT NOTE  Patient Care Team: Jaclyn Shaggy, MD as PCP - General (Internal Medicine) Earna Coder, MD as Consulting Physician (Oncology)  CHIEF COMPLAINTS/PURPOSE OF CONSULTATION: Left kidney mass  HISTORY OF PRESENTING ILLNESS: Patient ambulating-independently/.  Alone.   Justin [place] 57 y.o.  male has been referred to [place] for further evaluation of left kidney mass.  Patient has medical history significant for  CAD status post stent 2018 followed by [facility] cardiology/ DM II/ obesity/ HTN-was recently admitted to hospital for UTI with sepsis.  Incidentally noted to have a large left-sided kidney mass.  Says he was evaluated urology. Patient is currently established with [facility] Dr. Richardo Hanks for his bladder cancer/BPH with urinary obstruction symptoms.   Patient is currently s/p antibiotics. Diarrhea resolved.   Review of Systems  Constitutional:  Negative for chills, diaphoresis, fever, malaise/fatigue and weight loss.  HENT:  Negative for nosebleeds and sore throat.   Eyes:  Negative for double vision.  Respiratory:  Negative for cough, hemoptysis, sputum production, shortness of breath and wheezing.   Cardiovascular:  Negative for chest pain, palpitations, orthopnea and leg swelling.  Gastrointestinal:  Negative for abdominal pain, blood in stool, constipation, diarrhea, heartburn, melena, nausea and vomiting.  Genitourinary:  Negative for dysuria, frequency and urgency.  Musculoskeletal:  Negative for back pain and joint pain.  Skin: Negative.  Negative for itching and rash.  Neurological:  Negative for dizziness, tingling, focal weakness, weakness and headaches.  Endo/Heme/Allergies:  Does not bruise/bleed easily.  Psychiatric/Behavioral:  Negative for depression. The patient is not nervous/anxious and does not have insomnia.     MEDICAL HISTORY:  Past Medical History:  Diagnosis Date   Cancer (HCC)    bladder  cancer   Diabetes mellitus without complication (HCC)    Hyperlipidemia    Proteinuria     SURGICAL HISTORY: Past Surgical History:  Procedure Laterality Date   APPENDECTOMY     CORONARY STENT INTERVENTION N/A 12/28/2016   Procedure: CORONARY STENT INTERVENTION;  Surgeon: Dalia Heading, MD;  Location: [facility] INVASIVE CV LAB;  Service: Cardiovascular;  Laterality: N/A;   CORONARY STENT INTERVENTION N/A 12/28/2016   Procedure: CORONARY STENT INTERVENTION;  Surgeon: Marcina Millard, MD;  Location: [facility] INVASIVE CV LAB;  Service: Cardiovascular;  Laterality: N/A;   LEFT HEART CATH AND CORONARY ANGIOGRAPHY N/A 12/28/2016   Procedure: LEFT HEART CATH AND CORONARY ANGIOGRAPHY;  Surgeon: Dalia Heading, MD;  Location: [facility] INVASIVE CV LAB;  Service: Cardiovascular;  Laterality: N/A;    SOCIAL HISTORY: Social History   Socioeconomic History   Marital status: Single    Spouse name: Not on file   Number of children: Not on file   Years of education: Not on file   Highest education level: Not on file  Occupational History   Not on file  Tobacco Use   Smoking status: Never   Smokeless tobacco: Never  Vaping Use   Vaping status: Never Used  Substance and Sexual Activity   Alcohol use: No   Drug use: Never   Sexual activity: Not on file  Other Topics Concern   Not on file  Social History Narrative   Works for [employer]; no smoking; no alcohol. [place]; with self; no children.    Social Determinants of Health   Financial Resource Strain: Not on file  Food Insecurity: Food Insecurity Present (12/31/2022)   Hunger Vital Sign    Worried About Running Out of Food  in the Last Year: Never true    Ran Out of Food in the Last Year: Sometimes true  Transportation Needs: No Transportation Needs (12/31/2022)   PRAPARE - Administrator, Civil Service (Medical): No    Lack of Transportation (Non-Medical): No  Physical Activity: Not on file  Stress: Not on  file  Social Connections: Not on file  Intimate Partner Violence: Not At Risk (12/31/2022)   Humiliation, Afraid, Rape, and Kick questionnaire    Fear of Current or Ex-Partner: No    Emotionally Abused: No    Physically Abused: No    Sexually Abused: No    FAMILY HISTORY: Family History  Problem Relation Age of Onset   Breast cancer Mother    CAD Father     ALLERGIES:  is allergic to penicillins.  MEDICATIONS:  Current Outpatient Medications  Medication Sig Dispense Refill   atorvastatin (LIPITOR) 40 MG tablet Take 1 tablet (40 mg total) by mouth daily. 30 tablet 1   carvedilol (COREG) 3.125 MG tablet Take 1 tablet (3.125 mg total) by mouth 2 (two) times daily with a meal. 60 tablet 1   dutasteride (AVODART) 0.5 MG capsule Take 1 capsule (0.5 mg total) by mouth daily. 90 capsule 3   gabapentin (NEURONTIN) 400 MG capsule Take 1 capsule by mouth 3 (three) times daily.     JARDIANCE 25 MG TABS tablet Take 25 mg by mouth every morning.     levofloxacin (LEVAQUIN) 750 MG tablet Take 1 tablet (750 mg total) by mouth at bedtime. 5 tablet 0   losartan (COZAAR) 100 MG tablet Take 1 tablet by mouth daily.     metFORMIN (GLUCOPHAGE) 500 MG tablet Take 1,000 mg by mouth 2 (two) times daily.     nicotine (NICODERM CQ - DOSED IN MG/24 HOURS) 14 mg/24hr patch One 14 mg patch chest wall daily (okay to substitute generic) 28 patch 0   omeprazole (PRILOSEC) 40 MG capsule Take 40 mg by mouth daily.     Semaglutide, 1 MG/DOSE, (OZEMPIC, 1 MG/DOSE,) 4 MG/3ML SOPN Inject 1 mg into the skin.     tamsulosin (FLOMAX) 0.4 MG CAPS capsule Take 1 capsule (0.4 mg total) by mouth daily. 90 capsule 3   No current facility-administered medications for this visit.    PHYSICAL EXAMINATION:   Vitals:   12/31/22 1403  BP: (!) 123/56  Pulse: 60  Temp: 98.8 F (37.1 C)  SpO2: 98%   Filed Weights   12/31/22 1403  Weight: 292 lb (132.5 kg)   Obese.   Physical Exam Vitals and nursing note reviewed.   HENT:     Head: Normocephalic and atraumatic.     Mouth/Throat:     Pharynx: Oropharynx is clear.  Eyes:     Extraocular Movements: Extraocular movements intact.     Pupils: Pupils are equal, round, and reactive to light.  Cardiovascular:     Rate and Rhythm: Normal rate and regular rhythm.  Pulmonary:     Comments: Decreased breath sounds bilaterally.  Abdominal:     Palpations: Abdomen is soft.  Musculoskeletal:        General: Normal range of motion.     Cervical back: Normal range of motion.  Skin:    General: Skin is warm.  Neurological:     General: No focal deficit present.     Mental Status: He is alert and oriented to person, place, and time.  Psychiatric:        Behavior: Behavior  normal.        Judgment: Judgment normal.     LABORATORY DATA:  I have reviewed the data as listed Lab Results  Component Value Date   WBC 6.2 12/10/2022   HGB 13.0 12/10/2022   HCT 38.2 (L) 12/10/2022   MCV 81.1 12/10/2022   PLT 214 12/10/2022   Recent Labs    12/06/22 1623 12/07/22 0922 12/08/22 0501 12/10/22 0438  NA 131* 135 134* 136  K 3.6 4.0 3.6 3.8  CL 104 106 107 107  CO2 21* 21* 21* 23  GLUCOSE 125* 88 99 152*  BUN 18 15 15 17   CREATININE 1.23 0.95 1.02 0.80  CALCIUM 7.7* 7.9* 8.0* 8.3*  GFRNONAA >60 >60 >60 >60  PROT 6.5 6.4*  --   --   ALBUMIN 3.3* 3.3*  --   --   AST 42* 47*  --   --   ALT 41 48*  --   --   ALKPHOS 90 78  --   --   BILITOT 1.1 0.6  --   --     RADIOGRAPHIC STUDIES: I have personally reviewed the radiological images as listed and agreed with the findings in the report. CT ABDOMEN PELVIS W CONTRAST  Result Date: 12/06/2022 CLINICAL DATA:  Diarrhea and left lower quadrant pain. Fever, chills, and weakness. EXAM: CT ABDOMEN AND PELVIS WITH CONTRAST TECHNIQUE: Multidetector CT imaging of the abdomen and pelvis was performed using the standard protocol following bolus administration of intravenous contrast. RADIATION DOSE REDUCTION: This  exam was performed according to the departmental dose-optimization program which includes automated exposure control, adjustment of the mA and/or kV according to patient size and/or use of iterative reconstruction technique. CONTRAST:  OMNIPAQUE IOHEXOL 300 MG/ML  SOLN COMPARISON:  Ultrasound 10/09/2021.  CT chest 12/27/2016 FINDINGS: Lower chest: Lung bases are clear. Hepatobiliary: No focal liver lesions. Cholelithiasis. No gallbladder wall thickening or stranding. No bile duct dilatation. Pancreas: Unremarkable. No pancreatic ductal dilatation or surrounding inflammatory changes. Spleen: Normal in size without focal abnormality. Adrenals/Urinary Tract: No adrenal gland nodules. Large heterogeneously enhancing mass in the anterior left kidney measuring 8.3 by 8.4 x 10.2 cm in diameter. Appearances consistent with renal cell carcinoma. No definite tumor thrombus. No suggestion of local extension or adjacent lymphadenopathy. No hydronephrosis or hydroureter. The right kidney and bladder are normal. Stomach/Bowel: Stomach is within normal limits. Appendix is not identified. No evidence of bowel wall thickening, distention, or inflammatory changes. Vascular/Lymphatic: No significant vascular findings are present. No enlarged abdominal or pelvic lymph nodes. Reproductive: Prostate is unremarkable. Other: No abdominal wall hernia or abnormality. No abdominopelvic ascites. Musculoskeletal: Degenerative changes in the spine. No focal bone lesions. IMPRESSION: 1. 10.2 cm diameter solid mass in the left kidney consistent with renal cell carcinoma. No metastatic disease is identified. 2. Cholelithiasis without evidence of acute cholecystitis. 3. No evidence of bowel obstruction or inflammation. Electronically Signed   By: Burman Nieves M.D.   On: 12/06/2022 18:28   DG Chest 2 View  Result Date: 12/06/2022 CLINICAL DATA:  Fever of unknown origin EXAM: CHEST - 2 VIEW COMPARISON:  Chest radiograph dated 10/09/2021  FINDINGS: Normal lung volumes. No focal consolidations. No pleural effusion or pneumothorax. The heart size and mediastinal contours are within normal limits. No acute osseous abnormality. IMPRESSION: No active cardiopulmonary disease. Electronically Signed   By: Agustin Cree M.D.   On: 12/06/2022 16:47     Left kidney mass # AUG 2024- [incidental]Large heterogeneously enhancing mass in  the anterior left kidney measuring 8.3 by 8.4 x 10.2 cm in diameter. Appearances consistent with renal cell carcinoma. No definite tumor thrombus. No suggestion of local extension or adjacent lymphadenopathy. No hydronephrosis or hydroureter. The right kidney and bladder are normal.  # Patient is currently symptomatic from his underlying kidney mass.  Again very suspicious for malignancy.  Agree with urology recommendation for upfront surgery.  Currently awaiting evaluation with surgery/urology in [place] next week on 8/22.   # Discussed the possible role of adjuvant therapy/immunotherapy based upon final pathology.  However reassuring with no evidence of obvious malignancy outside the kidney at this time.  Patient will also need chest CT down the line.   # Superficial bladder cancer: [Dr.Sninski]  # CAD- stable.  # Obesity/OSA  Thank you Dr.Weiting for allowing me to participate in the care of your pleasant patient. Please do not hesitate to contact me with questions or concerns in the interim. Patient instructed to call us with date of surgery- re: follo w up er: discussion re: adjuvant therapy.  Written instructions were given.  # DISPOSITION:  # no labs today # follow up TBD- Dr.B  # I reviewed the blood work- with the patient in detail; also reviewed the imaging independently [as summarized above]; and with the patient in detail.      Above plan of care was discussed with patient/family in detail.  My contact information was given to the patient/family.       Earna Coder, MD 12/31/2022  2:34 PM

## 2023-01-09 ENCOUNTER — Inpatient Hospital Stay: Payer: BC Managed Care – PPO

## 2023-01-15 ENCOUNTER — Other Ambulatory Visit: Payer: Self-pay | Admitting: Urology

## 2023-01-16 NOTE — Patient Instructions (Addendum)
SURGICAL WAITING ROOM VISITATION  Patients having surgery or a procedure may have no more than 2 support people in the waiting area - these visitors may rotate.    Children under the age of 30 must have an adult with them who is not the patient.   If the patient needs to stay at the hospital during part of their recovery, the visitor guidelines for inpatient rooms apply. Pre-op nurse will coordinate an appropriate time for 1 support person to accompany patient in pre-op.  This support person may not rotate.    Please refer to the [facility] website for the visitor guidelines for Inpatients (after your surgery is over and you are in a regular room).       Your procedure is scheduled on: 01-30-23   Report to [facility] Main Entrance    Report to admitting at       0615 AM   Call this number if you have problems the morning of surgery [phone]   FOLLOW A CLEAR LIQUID DIET THE DAY BEFORE SURGERY. NOTHING BY MOUTH AFTER MIDNIGHT EXCEPT SIPS OF WATER WITH MEDS            If you have questions, please contact your surgeon's office.  One fleets enema  THE NIGHT BEFORE SURGERY      , One bottle of magnesium citrate  BY NOON DAY BEFORE SURGERY  FOLLOW BOWEL PREP AND ANY ADDITIONAL PRE OP INSTRUCTIONS YOU RECEIVED FROM YOUR SURGEON'S OFFICE!!!     Oral Hygiene is also important to reduce your risk of infection.                                    Remember - BRUSH YOUR TEETH THE MORNING OF SURGERY WITH YOUR REGULAR TOOTHPASTE  DENTURES WILL BE REMOVED PRIOR TO SURGERY PLEASE DO NOT APPLY "Poly grip" OR ADHESIVES!!!   Do NOT smoke after Midnight   Stop all vitamins and herbal supplements 7 days before surgery.   Take these medicines the morning of surgery with A SIP OF WATER: omeprazole, tamsulosin, gabapentin, carvedilol, atorvastatin, dutasteride  DO NOT TAKE ANY ORAL DIABETIC MEDICATIONS DAY OF YOUR SURGERY  How to Manage Your Diabetes Before and After Surgery  Why is  it important to control my blood sugar before and after surgery? Improving blood sugar levels before and after surgery helps healing and can limit problems. A way of improving blood sugar control is eating a healthy diet by:  Eating less sugar and carbohydrates  Increasing activity/exercise  Talking with your doctor about reaching your blood sugar goals High blood sugars (greater than 180 mg/dL) can raise your risk of infections and slow your recovery, so you will need to focus on controlling your diabetes during the weeks before surgery. Make sure that the doctor who takes care of your diabetes knows about your planned surgery including the date and location.  How do I manage my blood sugar before surgery? Check your blood sugar at least 4 times a day, starting 2 days before surgery, to make sure that the level is not too high or low. Check your blood sugar the morning of your surgery when you wake up and every 2 hours until you get to the Short Stay unit. If your blood sugar is less than 70 mg/dL, you will need to treat for low blood sugar: Do not take insulin. Treat a low blood sugar (less  than 70 mg/dL) with  cup of clear juice (cranberry or apple), 4 glucose tablets, OR glucose gel. Recheck blood sugar in 15 minutes after treatment (to make sure it is greater than 70 mg/dL). If your blood sugar is not greater than 70 mg/dL on recheck, call [phone] for further instructions. Report your blood sugar to the short stay nurse when you get to Short Stay.  If you are admitted to the hospital after surgery: Your blood sugar will be checked by the staff and you will probably be given insulin after surgery (instead of oral diabetes medicines) to make sure you have good blood sugar levels. The goal for blood sugar control after surgery is 80-180 mg/dL.   WHAT DO I DO ABOUT MY DIABETES MEDICATION?  Do not take oral diabetes medicines (pills) the morning of surgery.  Hold Jardiance 72 hours (  3 days ) prior to surgery last dose 01-26-23      Glimepiride do not take evening dose night before surgery and none day of surgery . No metformin day of surgery WHAT IS A BLOOD TRANSFUSION? Blood Transfusion Information  A transfusion is the replacement of blood or some of its parts. Blood is made up of multiple cells which provide different functions. Red blood cells carry oxygen and are used for blood loss replacement. White blood cells fight against infection. Platelets control bleeding. Plasma helps clot blood. Other blood products are available for specialized needs, such as hemophilia or other clotting disorders. BEFORE THE TRANSFUSION  Who gives blood for transfusions?  Healthy volunteers who are fully evaluated to make sure their blood is safe. This is blood bank blood. Transfusion therapy is the safest it has ever been in the practice of medicine. Before blood is taken from a donor, a complete history is taken to make sure that person has no history of diseases nor engages in risky social behavior (examples are intravenous drug use or sexual activity with multiple partners). The donor's travel history is screened to minimize risk of transmitting infections, such as malaria. The donated blood is tested for signs of infectious diseases, such as HIV and hepatitis. The blood is then tested to be sure it is compatible with you in order to minimize the chance of a transfusion reaction. If you or a relative donates blood, this is often done in anticipation of surgery and is not appropriate for emergency situations. It takes many days to process the donated blood. RISKS AND COMPLICATIONS Although transfusion therapy is very safe and saves many lives, the main dangers of transfusion include:  Getting an infectious disease. Developing a transfusion reaction. This is an allergic reaction to something in the blood you were given. Every precaution is taken to prevent this. The decision to have a  blood transfusion has been considered carefully by your caregiver before blood is given. Blood is not given unless the benefits outweigh the risks. AFTER THE TRANSFUSION Right after receiving a blood transfusion, you will usually feel much better and more energetic. This is especially true if your red blood cells have gotten low (anemic). The transfusion raises the level of the red blood cells which carry oxygen, and this usually causes an energy increase. The nurse administering the transfusion will monitor you carefully for complications. HOME CARE INSTRUCTIONS  No special instructions are needed after a transfusion. You may find your energy is better. Speak with your caregiver about any limitations on activity for underlying diseases you may have. SEEK MEDICAL CARE IF:  Your condition  is not improving after your transfusion. You develop redness or irritation at the intravenous (IV) site. SEEK IMMEDIATE MEDICAL CARE IF:  Any of the following symptoms occur over the next 12 hours: Shaking chills. You have a temperature by mouth above 102 F (38.9 C), not controlled by medicine. Chest, back, or muscle pain. People around you feel you are not acting correctly or are confused. Shortness of breath or difficulty breathing. Dizziness and fainting. You get a rash or develop hives. You have a decrease in urine output. Your urine turns a dark color or changes to pink, red, or brown. Any of the following symptoms occur over the next 10 days: You have a temperature by mouth above 102 F (38.9 C), not controlled by medicine. Shortness of breath. Weakness after normal activity. The white part of the eye turns yellow (jaundice). You have a decrease in the amount of urine or are urinating less often. Your urine turns a dark color or changes to pink, red, or brown. Document Released: 05/04/2000 Document Revised: 07/30/2011 Document Reviewed: 12/22/2007 ExitCare Patient Information 2014 ExitCare,  [place].  _______________________________________________________________________  DO NOT TAKE THE FOLLOWING 7 DAYS PRIOR TO SURGERY: Ozempic, Last dose 01-22-23      Wegovy, Rybelsus (Semaglutide), Byetta (exenatide), Bydureon (exenatide ER), Victoza, Saxenda (liraglutide), or Trulicity (dulaglutide) Mounjaro (Tirzepatide) Adlyxin (Lixisenatide), Polyethylene Glycol Loxenatide.  I Bring CPAP mask and tubing day of surgery.                              You may not have any metal on your body including hair pins, jewelry, and body piercing             Do not wear , lotions, powders, /cologne, or deodorant               Men may shave face and neck.   Do not bring valuables to the hospital. [facility] IS NOT             RESPONSIBLE   FOR VALUABLES.   Contacts, glasses, dentures or bridgework may not be worn into surgery.   Bring small overnight bag day of surgery.   DO NOT BRING YOUR HOME MEDICATIONS TO THE HOSPITAL. PHARMACY WILL DISPENSE MEDICATIONS LISTED ON YOUR MEDICATION LIST TO YOU DURING YOUR ADMISSION IN THE HOSPITAL!    Patients discharged on the day of surgery will not be allowed to drive home.  Someone NEEDS to stay with you for the first 24 hours after anesthesia.   Special Instructions: Bring a copy of your healthcare power of attorney and living will documents the day of surgery if you haven't scanned them before.              Please read over the following fact sheets you were given: IF YOU HAVE QUESTIONS ABOUT YOUR PRE-OP INSTRUCTIONS PLEASE CALL [phone]    If you test positive for Covid or have been in contact with anyone that has tested positive in the last 10 days please notify you surgeon.    [facility] - Preparing for Surgery Before surgery, you can play an important role.  Because skin is not sterile, your skin needs to be as free of germs as possible.  You can reduce the number of germs on your skin by washing with CHG (chlorahexidine gluconate) soap before  surgery.  CHG is an antiseptic cleaner which kills germs and bonds with the skin to continue killing  germs even after washing. Please DO NOT use if you have an allergy to CHG or antibacterial soaps.  If your skin becomes reddened/irritated stop using the CHG and inform your nurse when you arrive at Short Stay. Do not shave (including legs and underarms) for at least 48 hours prior to the first CHG shower.  You may shave your face/neck. Please follow these instructions carefully:  1.  Shower with CHG Soap the night before surgery and the  morning of Surgery.  2.  If you choose to wash your hair, wash your hair first as usual with your  normal  shampoo.  3.  After you shampoo, rinse your hair and body thoroughly to remove the  shampoo.                           4.  Use CHG as you would any other liquid soap.  You can apply chg directly  to the skin and wash                       Gently with a scrungie or clean washcloth.  5.  Apply the CHG Soap to your body ONLY FROM THE NECK DOWN.   Do not use on face/ open                           Wound or open sores. Avoid contact with eyes, ears mouth and genitals (private parts).                       Wash face,  Genitals (private parts) with your normal soap.             6.  Wash thoroughly, paying special attention to the area where your surgery  will be performed.  7.  Thoroughly rinse your body with warm water from the neck down.  8.  DO NOT shower/wash with your normal soap after using and rinsing off  the CHG Soap.                9.  Pat yourself dry with a clean towel.            10.  Wear clean pajamas.            11.  Place clean sheets on your bed the night of your first shower and do not  sleep with pets. Day of Surgery : Do not apply any lotions/deodorants the morning of surgery.  Please wear clean clothes to the hospital/surgery center.  FAILURE TO FOLLOW THESE INSTRUCTIONS MAY RESULT IN THE CANCELLATION OF YOUR SURGERY PATIENT  SIGNATURE_________________________________  NURSE SIGNATURE__________________________________  ________________________________________________________________________ After Midnight you may have the following liquids until ___ AM/ PM DAY OF SURGERY  Water Black Coffee (sugar ok, NO MILK/CREAM OR CREAMERS)  Tea (sugar ok, NO MILK/CREAM OR CREAMERS) regular and decaf Plain Jell-O (NO RED)                             Fruit ices (not with fruit pulp, NO RED) Popsicles (NO RED)                                  Juice: apple, WHITE grape, WHITE cranberry Sports drinks like Gatorade (NO RED) Clear broth(vegetable,chicken,beef)  Sample Menu Breakfast                                Lunch                                     Supper Cranberry juice                    Beef broth                            Chicken broth Jell-O                                     Grape juice                           Apple juice Coffee or tea                        Jell-O                                      Popsicle                                                Coffee or tea                        Coffee or tea  _____________________________________________________________________

## 2023-01-16 NOTE — Progress Notes (Addendum)
PCP - Dr. Rhina Brackett internal medicine Cardiologist - Arnoldo Hooker LOV 04-26-22 Had nuclear stress test 01-22-23 At [facility]   waiting for clearance  PPM/ICD -  Device Orders -  Rep Notified -   Chest x-ray - 12-06-22 epic EKG - 12-10-22 epic Stress Test - 2021 ECHO - 2021 Cardiac Cath -   Sleep Study -  CPAP - YES  Fasting Blood Sugar -  Checks Blood Sugar _0____ times a day  Blood Thinner Instructions: Aspirin Instructions:  ERAS Protcol - PRE-SURGERY    Jardiance last dose 01-26-23 Ozempic last dose 01-19-23   COVID vaccine -yes  Activity--Able to climb a flight of stairs without SOB or CP Anesthesia review: DM, HTN, OSA, NSTEMI, CAD stent x1  Patient denies shortness of breath, fever, cough and chest pain at PAT appointment   All instructions explained to the patient, with a verbal understanding of the material. Patient agrees to go over the instructions while at home for a better understanding. Patient also instructed to self quarantine after being tested for COVID-19. The opportunity to ask questions was provided.

## 2023-01-16 NOTE — Progress Notes (Signed)
Please sign orders for preop

## 2023-01-18 ENCOUNTER — Other Ambulatory Visit: Payer: Self-pay | Admitting: Nurse Practitioner

## 2023-01-18 DIAGNOSIS — I214 Non-ST elevation (NSTEMI) myocardial infarction: Secondary | ICD-10-CM

## 2023-01-18 DIAGNOSIS — Z01810 Encounter for preprocedural cardiovascular examination: Secondary | ICD-10-CM

## 2023-01-18 DIAGNOSIS — I2510 Atherosclerotic heart disease of native coronary artery without angina pectoris: Secondary | ICD-10-CM

## 2023-01-20 DIAGNOSIS — C642 Malignant neoplasm of left kidney, except renal pelvis: Secondary | ICD-10-CM

## 2023-01-20 DIAGNOSIS — N2889 Other specified disorders of kidney and ureter: Secondary | ICD-10-CM

## 2023-01-20 HISTORY — DX: Other specified disorders of kidney and ureter: N28.89

## 2023-01-20 HISTORY — DX: Malignant neoplasm of left kidney, except renal pelvis: C64.2

## 2023-01-22 ENCOUNTER — Encounter (HOSPITAL_COMMUNITY): Payer: Self-pay

## 2023-01-22 ENCOUNTER — Encounter
Admission: RE | Admit: 2023-01-22 | Discharge: 2023-01-22 | Disposition: A | Discharge status: Home / Self Care | Payer: BC Managed Care – PPO | Source: Ambulatory Visit | Attending: Nurse Practitioner | Admitting: Nurse Practitioner

## 2023-01-22 ENCOUNTER — Other Ambulatory Visit: Payer: Self-pay

## 2023-01-22 ENCOUNTER — Encounter (HOSPITAL_COMMUNITY)
Admission: RE | Admit: 2023-01-22 | Discharge: 2023-01-22 | Disposition: A | Discharge status: Home / Self Care | Payer: BC Managed Care – PPO | Source: Ambulatory Visit | Attending: Urology | Admitting: Urology

## 2023-01-22 VITALS — BP 146/68 | HR 66 | Temp 98.9°F | Resp 16 | Ht 68.0 in | Wt 285.4 lb

## 2023-01-22 DIAGNOSIS — Z01810 Encounter for preprocedural cardiovascular examination: Secondary | ICD-10-CM | POA: Diagnosis present

## 2023-01-22 DIAGNOSIS — I2510 Atherosclerotic heart disease of native coronary artery without angina pectoris: Secondary | ICD-10-CM | POA: Insufficient documentation

## 2023-01-22 DIAGNOSIS — N2889 Other specified disorders of kidney and ureter: Secondary | ICD-10-CM | POA: Insufficient documentation

## 2023-01-22 DIAGNOSIS — Z955 Presence of coronary angioplasty implant and graft: Secondary | ICD-10-CM | POA: Diagnosis not present

## 2023-01-22 DIAGNOSIS — E1169 Type 2 diabetes mellitus with other specified complication: Secondary | ICD-10-CM

## 2023-01-22 DIAGNOSIS — Z01818 Encounter for other preprocedural examination: Secondary | ICD-10-CM | POA: Diagnosis present

## 2023-01-22 DIAGNOSIS — I252 Old myocardial infarction: Secondary | ICD-10-CM | POA: Insufficient documentation

## 2023-01-22 DIAGNOSIS — E119 Type 2 diabetes mellitus without complications: Secondary | ICD-10-CM | POA: Diagnosis not present

## 2023-01-22 DIAGNOSIS — I214 Non-ST elevation (NSTEMI) myocardial infarction: Secondary | ICD-10-CM | POA: Insufficient documentation

## 2023-01-22 DIAGNOSIS — G4730 Sleep apnea, unspecified: Secondary | ICD-10-CM | POA: Diagnosis not present

## 2023-01-22 HISTORY — DX: Atherosclerotic heart disease of native coronary artery without angina pectoris: I25.10

## 2023-01-22 HISTORY — DX: Unspecified osteoarthritis, unspecified site: M19.90

## 2023-01-22 HISTORY — DX: Sleep apnea, unspecified: G47.30

## 2023-01-22 HISTORY — DX: Gastro-esophageal reflux disease without esophagitis: K21.9

## 2023-01-22 HISTORY — DX: Acute myocardial infarction, unspecified: I21.9

## 2023-01-22 HISTORY — DX: Myoneural disorder, unspecified: G70.9

## 2023-01-22 LAB — NM MYOCAR MULTI W/SPECT W/WALL MOTION / EF
Estimated workload: 1
Exercise duration (min): 0 min
Exercise duration (sec): 0 s
LV dias vol: 104 mL (ref 62–150)
LV sys vol: 30 mL
MPHR: 161 {beats}/min
Nuc Stress EF: 71 %
Peak HR: 93 {beats}/min
Percent HR: 57 %
Rest HR: 67 {beats}/min
Rest Nuclear Isotope Dose: 10.5 mCi
SDS: 2
SRS: 0
SSS: 0
ST Depression (mm): 0 mm
Stress Nuclear Isotope Dose: 31.6 mCi
TID: 1.18

## 2023-01-22 LAB — CBC
HCT: 45 % (ref 39.0–52.0)
Hemoglobin: 14.4 g/dL (ref 13.0–17.0)
MCH: 27.7 pg (ref 26.0–34.0)
MCHC: 32 g/dL (ref 30.0–36.0)
MCV: 86.7 fL (ref 80.0–100.0)
Platelets: 247 10*3/uL (ref 150–400)
RBC: 5.19 MIL/uL (ref 4.22–5.81)
RDW: 14.9 % (ref 11.5–15.5)
WBC: 7.5 10*3/uL (ref 4.0–10.5)
nRBC: 0 % (ref 0.0–0.2)

## 2023-01-22 LAB — BASIC METABOLIC PANEL
Anion gap: 11 (ref 5–15)
BUN: 19 mg/dL (ref 6–20)
CO2: 23 mmol/L (ref 22–32)
Calcium: 8.8 mg/dL — ABNORMAL LOW (ref 8.9–10.3)
Chloride: 102 mmol/L (ref 98–111)
Creatinine, Ser: 1.11 mg/dL (ref 0.61–1.24)
GFR, Estimated: >60 mL/min (ref >60)
Glucose, Bld: 198 mg/dL — ABNORMAL HIGH (ref 70–99)
Potassium: 3.8 mmol/L (ref 3.5–5.1)
Sodium: 136 mmol/L (ref 135–145)

## 2023-01-22 LAB — GLUCOSE, CAPILLARY: Glucose-Capillary: 197 mg/dL — ABNORMAL HIGH (ref 70–99)

## 2023-01-22 MED ORDER — TECHNETIUM TC 99M TETROFOSMIN IV KIT
30.0000 | PACK | Freq: Once | INTRAVENOUS | Status: AC | PRN
  Administered 2023-01-22: 31.59 via INTRAVENOUS

## 2023-01-22 MED ORDER — REGADENOSON 0.4 MG/5ML IV SOLN
0.4000 mg | Freq: Once | INTRAVENOUS | Status: AC
  Administered 2023-01-22: 0.4 mg via INTRAVENOUS
  Filled 2023-01-22: qty 5

## 2023-01-22 MED ORDER — TECHNETIUM TC 99M TETROFOSMIN IV KIT
10.0000 | PACK | Freq: Once | INTRAVENOUS | Status: AC | PRN
  Administered 2023-01-22: 10.51 via INTRAVENOUS

## 2023-01-23 ENCOUNTER — Inpatient Hospital Stay: Payer: BC Managed Care – PPO | Attending: Internal Medicine

## 2023-01-23 NOTE — Progress Notes (Addendum)
Anesthesia Chart Review   Case: [id number] Date/Time: 01/30/23 0815   Procedure: XI ROBOTIC ASSISTED LEFT LAPAROSCOPIC NEPHRECTOMY AND RETROPERITONEAL NODE DISSECTION (Left) - 180 MINUTES   Anesthesia type: General   Pre-op diagnosis: LARGE LEFT RENAL MASS   Location: [facility] ROOM 03 / [facility]   Surgeons: Loletta Parish., MD       DISCUSSION:57 y.o. never smoker with h/o sleep apnea with CPAP, DM II, CAD s/p stent, large left renal mass scheduled for above procedure 01/30/2023 with Dr. Sebastian Ache.   Pt last seen by cardiology 01/18/2023 for preop evaluation.  Per OV note, "Continue secondary ASCVD prevention with aspirin, atorvastatin, carvedilol. Without angina. Will obtain MPS for ischemic evaluation for surgery clearance with no post-stent testing to review. Normal ECHO in 2021. Euvolemic, asymptomatic, without h/o HF."  Stress test ordered at this visit, pending.   Low risk stress test 01/22/2023. VS: BP (!) 146/68   Pulse 66   Temp 37.2 C (Oral)   Resp 16   Ht 5\' 8"  (1.727 m)   Wt 129.5 kg   SpO2 98%   BMI 43.39 kg/m   PROVIDERS: Jaclyn Shaggy, MD is PCP    LABS: Labs reviewed: Acceptable for surgery. (all labs ordered are listed, but only abnormal results are displayed)  Labs Reviewed  BASIC METABOLIC PANEL - Abnormal; Notable for the following components:      Result Value   Glucose, Bld 198 (*)    Calcium 8.8 (*)    All other components within normal limits  GLUCOSE, CAPILLARY - Abnormal; Notable for the following components:   Glucose-Capillary 197 (*)    All other components within normal limits  CBC  TYPE AND SCREEN     IMAGES:   EKG:   CV: Myocardial Perfusion 01/22/2023   Normal pharmacologic myocardial perfusion stress test without evidence of significant ischemia or scar.   Left ventricular systolic function is normal (LVEF > 65%).   Coronary stent is noted in the proximal LAD.   This is a low-risk study.   No significant change from prior  study on 12/28/2019.  Echo 12/24/2019  1. Left ventricular ejection fraction, by estimation, is 60 to 65%. The  left ventricle has normal function. The left ventricle has no regional  wall motion abnormalities. Left ventricular diastolic parameters were  normal.   2. Right ventricular systolic function is normal. The right ventricular  size is normal. There is normal pulmonary artery systolic pressure.   3. The mitral valve is normal in structure. No evidence of mitral valve  regurgitation.   4. The aortic valve is grossly normal. Aortic valve regurgitation is not  visualized.  Past Medical History:  Diagnosis Date   Arthritis    Cancer (HCC)    bladder cancer   Coronary artery disease    Stent x 1   Diabetes mellitus without complication (HCC)    type 2   GERD (gastroesophageal reflux disease)    Hyperlipidemia    Myocardial infarction (HCC)    Neuromuscular disorder (HCC)    neuropathy feet   Proteinuria    Sleep apnea    wears Cpap    Past Surgical History:  Procedure Laterality Date   APPENDECTOMY     CORONARY STENT INTERVENTION N/A 12/28/2016   Procedure: CORONARY STENT INTERVENTION;  Surgeon: Dalia Heading, MD;  Location: [facility] INVASIVE CV LAB;  Service: Cardiovascular;  Laterality: N/A;   CORONARY STENT INTERVENTION N/A 12/28/2016   Procedure: CORONARY STENT INTERVENTION;  Surgeon: Marcina Millard, MD;  Location: [facility] INVASIVE CV LAB;  Service: Cardiovascular;  Laterality: N/A;   LEFT HEART CATH AND CORONARY ANGIOGRAPHY N/A 12/28/2016   Procedure: LEFT HEART CATH AND CORONARY ANGIOGRAPHY;  Surgeon: Dalia Heading, MD;  Location: [facility] INVASIVE CV LAB;  Service: Cardiovascular;  Laterality: N/A;    MEDICATIONS:  aspirin EC 81 MG tablet   atorvastatin (LIPITOR) 40 MG tablet   carvedilol (COREG) 3.125 MG tablet   dutasteride (AVODART) 0.5 MG capsule   gabapentin (NEURONTIN) 400 MG capsule   glimepiride (AMARYL) 4 MG tablet   JARDIANCE 25 MG TABS tablet    levofloxacin (LEVAQUIN) 750 MG tablet   losartan (COZAAR) 100 MG tablet   metFORMIN (GLUCOPHAGE) 500 MG tablet   nicotine (NICODERM CQ - DOSED IN MG/24 HOURS) 14 mg/24hr patch   NON FORMULARY   omeprazole (PRILOSEC) 40 MG capsule   Semaglutide, 1 MG/DOSE, (OZEMPIC, 1 MG/DOSE,) 4 MG/3ML SOPN   tamsulosin (FLOMAX) 0.4 MG CAPS capsule   No current facility-administered medications for this encounter.     Jodell Cipro Ward, PA-C [facility]-Surgical Testing [phone]

## 2023-01-25 NOTE — Anesthesia Preprocedure Evaluation (Signed)
Anesthesia Evaluation  Patient identified by MRN, date of birth, ID band Patient awake    Reviewed: Allergy & Precautions, NPO status , Patient's Chart, lab work & pertinent test results, reviewed documented beta blocker date and time   History of Anesthesia Complications Negative for: history of anesthetic complications  Airway Mallampati: IV  TM Distance: >3 FB Neck ROM: Limited    Dental no notable dental hx.    Pulmonary sleep apnea and Continuous Positive Airway Pressure Ventilation , neg COPD, neg PE   breath sounds clear to auscultation       Cardiovascular hypertension, (-) angina + CAD, + Past MI and + Cardiac Stents  (-) CABG, (-) CHF, (-) Orthopnea and (-) PND (-) dysrhythmias (-) pacemaker(-) Cardiac Defibrillator (-) Valvular Problems/Murmurs Rhythm:Regular Rate:Normal     Neuro/Psych  Neuromuscular disease    GI/Hepatic ,GERD  ,,(+) neg Cirrhosis        Endo/Other  diabetes, Type 2    Renal/GU Renal disease     Musculoskeletal  (+) Arthritis ,    Abdominal  (+) + obese  Peds  Hematology   Anesthesia Other Findings   Reproductive/Obstetrics                             Anesthesia Physical Anesthesia Plan  ASA: 3  Anesthesia Plan: General   Post-op Pain Management:    Induction: Intravenous  PONV Risk Score and Plan: 2 and Ondansetron and Dexamethasone  Airway Management Planned: Oral ETT and Video Laryngoscope Planned  Additional Equipment: Arterial line  Intra-op Plan:   Post-operative Plan: Extubation in OR  Informed Consent: I have reviewed the patients History and Physical, chart, labs and discussed the procedure including the risks, benefits and alternatives for the proposed anesthesia with the patient or authorized representative who has indicated his/her understanding and acceptance.     Dental advisory given  Plan Discussed with: CRNA  Anesthesia  Plan Comments: (See PAT note 01/22/2023)       Anesthesia Quick Evaluation

## 2023-01-30 ENCOUNTER — Inpatient Hospital Stay (HOSPITAL_COMMUNITY)
Admission: RE | Admit: 2023-01-30 | Discharge: 2023-02-01 | DRG: 657 | Disposition: A | Discharge status: Home / Self Care | Payer: BC Managed Care – PPO | Attending: Urology | Admitting: Urology

## 2023-01-30 ENCOUNTER — Other Ambulatory Visit: Payer: Self-pay

## 2023-01-30 ENCOUNTER — Inpatient Hospital Stay (HOSPITAL_COMMUNITY): Payer: BC Managed Care – PPO | Admitting: Certified Registered"

## 2023-01-30 ENCOUNTER — Inpatient Hospital Stay (HOSPITAL_COMMUNITY): Payer: BC Managed Care – PPO | Admitting: Physician Assistant

## 2023-01-30 ENCOUNTER — Encounter (HOSPITAL_COMMUNITY)
Admission: RE | Disposition: A | Discharge status: Home / Self Care | Payer: Self-pay | Source: Home / Self Care | Attending: Urology

## 2023-01-30 ENCOUNTER — Encounter (HOSPITAL_COMMUNITY): Payer: Self-pay | Admitting: Urology

## 2023-01-30 DIAGNOSIS — Z6841 Body Mass Index (BMI) 40.0 and over, adult: Secondary | ICD-10-CM | POA: Diagnosis not present

## 2023-01-30 DIAGNOSIS — Z7984 Long term (current) use of oral hypoglycemic drugs: Secondary | ICD-10-CM

## 2023-01-30 DIAGNOSIS — N281 Cyst of kidney, acquired: Secondary | ICD-10-CM | POA: Diagnosis present

## 2023-01-30 DIAGNOSIS — Z803 Family history of malignant neoplasm of breast: Secondary | ICD-10-CM

## 2023-01-30 DIAGNOSIS — Z8551 Personal history of malignant neoplasm of bladder: Secondary | ICD-10-CM | POA: Diagnosis not present

## 2023-01-30 DIAGNOSIS — N2889 Other specified disorders of kidney and ureter: Secondary | ICD-10-CM | POA: Diagnosis present

## 2023-01-30 DIAGNOSIS — Z8249 Family history of ischemic heart disease and other diseases of the circulatory system: Secondary | ICD-10-CM | POA: Diagnosis not present

## 2023-01-30 DIAGNOSIS — E119 Type 2 diabetes mellitus without complications: Secondary | ICD-10-CM | POA: Diagnosis present

## 2023-01-30 DIAGNOSIS — K219 Gastro-esophageal reflux disease without esophagitis: Secondary | ICD-10-CM | POA: Diagnosis present

## 2023-01-30 DIAGNOSIS — E785 Hyperlipidemia, unspecified: Secondary | ICD-10-CM | POA: Diagnosis present

## 2023-01-30 DIAGNOSIS — Z79899 Other long term (current) drug therapy: Secondary | ICD-10-CM | POA: Diagnosis not present

## 2023-01-30 DIAGNOSIS — Z955 Presence of coronary angioplasty implant and graft: Secondary | ICD-10-CM | POA: Diagnosis not present

## 2023-01-30 DIAGNOSIS — Z880 Allergy status to penicillin: Secondary | ICD-10-CM | POA: Diagnosis not present

## 2023-01-30 DIAGNOSIS — C642 Malignant neoplasm of left kidney, except renal pelvis: Principal | ICD-10-CM | POA: Diagnosis present

## 2023-01-30 DIAGNOSIS — E1169 Type 2 diabetes mellitus with other specified complication: Secondary | ICD-10-CM

## 2023-01-30 DIAGNOSIS — I252 Old myocardial infarction: Secondary | ICD-10-CM

## 2023-01-30 DIAGNOSIS — I2510 Atherosclerotic heart disease of native coronary artery without angina pectoris: Secondary | ICD-10-CM | POA: Diagnosis present

## 2023-01-30 DIAGNOSIS — E669 Obesity, unspecified: Secondary | ICD-10-CM | POA: Diagnosis present

## 2023-01-30 DIAGNOSIS — Z7985 Long-term (current) use of injectable non-insulin antidiabetic drugs: Secondary | ICD-10-CM | POA: Diagnosis not present

## 2023-01-30 DIAGNOSIS — Z7982 Long term (current) use of aspirin: Secondary | ICD-10-CM | POA: Diagnosis not present

## 2023-01-30 HISTORY — PX: ROBOT ASSISTED LAPAROSCOPIC NEPHRECTOMY: SHX5140

## 2023-01-30 LAB — GLUCOSE, CAPILLARY
Glucose-Capillary: 205 mg/dL — ABNORMAL HIGH (ref 70–99)
Glucose-Capillary: 199 mg/dL — ABNORMAL HIGH (ref 70–99)
Glucose-Capillary: 205 mg/dL — ABNORMAL HIGH (ref 70–99)
Glucose-Capillary: 229 mg/dL — ABNORMAL HIGH (ref 70–99)

## 2023-01-30 LAB — TYPE AND SCREEN
ABO/RH(D): O POS
Antibody Screen: NEGATIVE

## 2023-01-30 LAB — ABO/RH: ABO/RH(D): O POS

## 2023-01-30 LAB — HEMOGLOBIN AND HEMATOCRIT, BLOOD
HCT: 46.2 % (ref 39.0–52.0)
Hemoglobin: 14.9 g/dL (ref 13.0–17.0)

## 2023-01-30 SURGERY — NEPHRECTOMY, RADICAL, ROBOT-ASSISTED, LAPAROSCOPIC, ADULT
Anesthesia: General | Laterality: Left

## 2023-01-30 MED ORDER — FENTANYL CITRATE (PF) 100 MCG/2ML IJ SOLN
INTRAMUSCULAR | Status: AC
  Filled 2023-01-30: qty 2

## 2023-01-30 MED ORDER — DEXMEDETOMIDINE HCL IN NACL 80 MCG/20ML IV SOLN
INTRAVENOUS | Status: DC | PRN
Start: 2023-01-30 — End: 2023-01-30
  Administered 2023-01-30: 8 ug via INTRAVENOUS
  Administered 2023-01-30: 6 ug via INTRAVENOUS

## 2023-01-30 MED ORDER — ALBUMIN HUMAN 5 % IV SOLN
INTRAVENOUS | Status: DC | PRN
Start: 2023-01-30 — End: 2023-01-30

## 2023-01-30 MED ORDER — PHENYLEPHRINE 80 MCG/ML (10ML) SYRINGE FOR IV PUSH (FOR BLOOD PRESSURE SUPPORT)
PREFILLED_SYRINGE | INTRAVENOUS | Status: AC
  Filled 2023-01-30: qty 10

## 2023-01-30 MED ORDER — DEXTROSE 5 % IV SOLN
INTRAVENOUS | Status: DC | PRN
  Administered 2023-01-30: 3 g via INTRAVENOUS

## 2023-01-30 MED ORDER — CARVEDILOL 3.125 MG PO TABS
3.1250 mg | ORAL_TABLET | Freq: Two times a day (BID) | ORAL | Status: DC
  Administered 2023-01-30 – 2023-02-01 (×4): 3.125 mg via ORAL
  Filled 2023-01-30 (×4): qty 1

## 2023-01-30 MED ORDER — KETAMINE HCL 10 MG/ML IJ SOLN
INTRAMUSCULAR | Status: DC | PRN
  Administered 2023-01-30: 20 mg via INTRAVENOUS

## 2023-01-30 MED ORDER — DIPHENHYDRAMINE HCL 12.5 MG/5ML PO ELIX: 12.5000 mg | ORAL_SOLUTION | Freq: Four times a day (QID) | ORAL | Status: DC | PRN

## 2023-01-30 MED ORDER — ONDANSETRON HCL 4 MG/2ML IJ SOLN
INTRAMUSCULAR | Status: AC
  Filled 2023-01-30: qty 2

## 2023-01-30 MED ORDER — KETAMINE HCL 50 MG/5ML IJ SOSY
PREFILLED_SYRINGE | INTRAMUSCULAR | Status: AC
  Filled 2023-01-30: qty 5

## 2023-01-30 MED ORDER — DEXAMETHASONE SODIUM PHOSPHATE 10 MG/ML IJ SOLN
INTRAMUSCULAR | Status: AC
  Filled 2023-01-30: qty 1

## 2023-01-30 MED ORDER — CHLORHEXIDINE GLUCONATE 0.12 % MT SOLN
15.0000 mL | Freq: Once | OROMUCOSAL | Status: AC
  Administered 2023-01-30: 15 mL via OROMUCOSAL

## 2023-01-30 MED ORDER — FENTANYL CITRATE PF 50 MCG/ML IJ SOSY
25.0000 ug | PREFILLED_SYRINGE | INTRAMUSCULAR | Status: DC | PRN
  Administered 2023-01-30: 50 ug via INTRAVENOUS

## 2023-01-30 MED ORDER — LACTATED RINGERS IV SOLN: INTRAVENOUS | Status: DC

## 2023-01-30 MED ORDER — OXYCODONE HCL 5 MG/5ML PO SOLN: 5.0000 mg | Freq: Once | ORAL | Status: DC | PRN

## 2023-01-30 MED ORDER — PROPOFOL 10 MG/ML IV BOLUS
INTRAVENOUS | Status: AC
  Filled 2023-01-30: qty 20

## 2023-01-30 MED ORDER — CEFAZOLIN IN SODIUM CHLORIDE 3-0.9 GM/100ML-% IV SOLN
3.0000 g | INTRAVENOUS | Status: DC
  Filled 2023-01-30: qty 100

## 2023-01-30 MED ORDER — INSULIN ASPART 100 UNIT/ML IJ SOLN
0.0000 [IU] | Freq: Three times a day (TID) | INTRAMUSCULAR | Status: DC
  Administered 2023-01-31: 8 [IU] via SUBCUTANEOUS
  Administered 2023-01-31 – 2023-02-01 (×4): 5 [IU] via SUBCUTANEOUS

## 2023-01-30 MED ORDER — TAMSULOSIN HCL 0.4 MG PO CAPS
0.4000 mg | ORAL_CAPSULE | Freq: Every day | ORAL | Status: DC
  Administered 2023-01-31 – 2023-02-01 (×2): 0.4 mg via ORAL
  Filled 2023-01-30 (×2): qty 1

## 2023-01-30 MED ORDER — DOCUSATE SODIUM 100 MG PO CAPS
100.0000 mg | ORAL_CAPSULE | Freq: Two times a day (BID) | ORAL | Status: DC
  Administered 2023-01-30 – 2023-02-01 (×4): 100 mg via ORAL
  Filled 2023-01-30 (×4): qty 1

## 2023-01-30 MED ORDER — EPHEDRINE 5 MG/ML INJ
INTRAVENOUS | Status: AC
  Filled 2023-01-30: qty 10

## 2023-01-30 MED ORDER — LIDOCAINE HCL (PF) 2 % IJ SOLN
INTRAMUSCULAR | Status: AC
  Filled 2023-01-30: qty 15

## 2023-01-30 MED ORDER — GABAPENTIN 400 MG PO CAPS
400.0000 mg | ORAL_CAPSULE | Freq: Three times a day (TID) | ORAL | Status: DC
  Administered 2023-01-30 – 2023-02-01 (×5): 400 mg via ORAL
  Filled 2023-01-30 (×6): qty 1

## 2023-01-30 MED ORDER — SUCCINYLCHOLINE CHLORIDE 200 MG/10ML IV SOSY
PREFILLED_SYRINGE | INTRAVENOUS | Status: AC
  Filled 2023-01-30: qty 10

## 2023-01-30 MED ORDER — HYDROCODONE-ACETAMINOPHEN 5-325 MG PO TABS: 1.0000 | ORAL_TABLET | Freq: Four times a day (QID) | ORAL | 0 refills | Status: DC | PRN

## 2023-01-30 MED ORDER — STERILE WATER FOR IRRIGATION IR SOLN
Status: DC | PRN
  Administered 2023-01-30: 1000 mL

## 2023-01-30 MED ORDER — MIDAZOLAM HCL 2 MG/2ML IJ SOLN
INTRAMUSCULAR | Status: AC
  Filled 2023-01-30: qty 2

## 2023-01-30 MED ORDER — EPHEDRINE SULFATE (PRESSORS) 50 MG/ML IJ SOLN
INTRAMUSCULAR | Status: DC | PRN
Start: 2023-01-30 — End: 2023-01-30
  Administered 2023-01-30 (×2): 10 mg via INTRAVENOUS

## 2023-01-30 MED ORDER — HYDROMORPHONE HCL 1 MG/ML IJ SOLN: 0.5000 mg | INTRAMUSCULAR | Status: DC | PRN

## 2023-01-30 MED ORDER — ACETAMINOPHEN 10 MG/ML IV SOLN
1000.0000 mg | Freq: Four times a day (QID) | INTRAVENOUS | Status: AC
  Administered 2023-01-30 – 2023-01-31 (×4): 1000 mg via INTRAVENOUS
  Filled 2023-01-30 (×4): qty 100

## 2023-01-30 MED ORDER — SODIUM CHLORIDE 0.45 % IV SOLN: INTRAVENOUS | Status: DC

## 2023-01-30 MED ORDER — TRIPLE ANTIBIOTIC 3.5-400-5000 EX OINT: 1.0000 | TOPICAL_OINTMENT | Freq: Three times a day (TID) | CUTANEOUS | Status: DC | PRN

## 2023-01-30 MED ORDER — GLYCOPYRROLATE 0.2 MG/ML IJ SOLN
INTRAMUSCULAR | Status: DC | PRN
  Administered 2023-01-30: .2 mg via INTRAVENOUS

## 2023-01-30 MED ORDER — OXYCODONE HCL 5 MG PO TABS: 5.0000 mg | ORAL_TABLET | Freq: Once | ORAL | Status: DC | PRN

## 2023-01-30 MED ORDER — ONDANSETRON HCL 4 MG/2ML IJ SOLN: 4.0000 mg | Freq: Once | INTRAMUSCULAR | Status: DC | PRN

## 2023-01-30 MED ORDER — ORAL CARE MOUTH RINSE: 15.0000 mL | Freq: Once | OROMUCOSAL | Status: AC

## 2023-01-30 MED ORDER — DIPHENHYDRAMINE HCL 50 MG/ML IJ SOLN: 12.5000 mg | Freq: Four times a day (QID) | INTRAMUSCULAR | Status: DC | PRN

## 2023-01-30 MED ORDER — ROCURONIUM BROMIDE 100 MG/10ML IV SOLN
INTRAVENOUS | Status: DC | PRN
  Administered 2023-01-30: 10 mg via INTRAVENOUS
  Administered 2023-01-30: 100 mg via INTRAVENOUS
  Administered 2023-01-30 (×2): 20 mg via INTRAVENOUS

## 2023-01-30 MED ORDER — FENTANYL CITRATE (PF) 100 MCG/2ML IJ SOLN
INTRAMUSCULAR | Status: DC | PRN
  Administered 2023-01-30: 100 ug via INTRAVENOUS
  Administered 2023-01-30: 50 ug via INTRAVENOUS

## 2023-01-30 MED ORDER — SUGAMMADEX SODIUM 200 MG/2ML IV SOLN
INTRAVENOUS | Status: DC | PRN
  Administered 2023-01-30: 400 mg via INTRAVENOUS

## 2023-01-30 MED ORDER — ATORVASTATIN CALCIUM 40 MG PO TABS
40.0000 mg | ORAL_TABLET | Freq: Every day | ORAL | Status: DC
  Administered 2023-01-31 – 2023-02-01 (×2): 40 mg via ORAL
  Filled 2023-01-30 (×2): qty 1

## 2023-01-30 MED ORDER — PHENYLEPHRINE HCL-NACL 20-0.9 MG/250ML-% IV SOLN
INTRAVENOUS | Status: DC | PRN
  Administered 2023-01-30: 25 ug/min via INTRAVENOUS

## 2023-01-30 MED ORDER — HYOSCYAMINE SULFATE 0.125 MG SL SUBL: 0.1250 mg | SUBLINGUAL_TABLET | SUBLINGUAL | Status: DC | PRN

## 2023-01-30 MED ORDER — DOCUSATE SODIUM 100 MG PO CAPS: 100.0000 mg | ORAL_CAPSULE | Freq: Two times a day (BID) | ORAL | Status: AC

## 2023-01-30 MED ORDER — PHENYLEPHRINE HCL (PRESSORS) 10 MG/ML IV SOLN
INTRAVENOUS | Status: AC
  Filled 2023-01-30: qty 1

## 2023-01-30 MED ORDER — LACTATED RINGERS IV SOLN: INTRAVENOUS | Status: DC | PRN

## 2023-01-30 MED ORDER — DUTASTERIDE 0.5 MG PO CAPS
0.5000 mg | ORAL_CAPSULE | Freq: Every day | ORAL | Status: DC
  Administered 2023-01-31 – 2023-02-01 (×2): 0.5 mg via ORAL
  Filled 2023-01-30 (×2): qty 1

## 2023-01-30 MED ORDER — ONDANSETRON HCL 4 MG/2ML IJ SOLN: 4.0000 mg | INTRAMUSCULAR | Status: DC | PRN

## 2023-01-30 MED ORDER — ROCURONIUM BROMIDE 10 MG/ML (PF) SYRINGE
PREFILLED_SYRINGE | INTRAVENOUS | Status: AC
  Filled 2023-01-30: qty 10

## 2023-01-30 MED ORDER — BUPIVACAINE LIPOSOME 1.3 % IJ SUSP
INTRAMUSCULAR | Status: AC
  Filled 2023-01-30: qty 20

## 2023-01-30 MED ORDER — FLEET ENEMA RE ENEM: 1.0000 | ENEMA | Freq: Once | RECTAL | Status: DC

## 2023-01-30 MED ORDER — DEXAMETHASONE SODIUM PHOSPHATE 4 MG/ML IJ SOLN
INTRAMUSCULAR | Status: DC | PRN
Start: 2023-01-30 — End: 2023-01-30
  Administered 2023-01-30: 5 mg via INTRAVENOUS

## 2023-01-30 MED ORDER — OXYCODONE HCL 5 MG PO TABS
5.0000 mg | ORAL_TABLET | ORAL | Status: DC | PRN
  Administered 2023-01-31 (×2): 5 mg via ORAL
  Filled 2023-01-30 (×2): qty 1

## 2023-01-30 MED ORDER — ROCURONIUM BROMIDE 10 MG/ML (PF) SYRINGE
PREFILLED_SYRINGE | INTRAVENOUS | Status: AC
  Filled 2023-01-30: qty 20

## 2023-01-30 MED ORDER — MIDAZOLAM HCL 5 MG/5ML IJ SOLN
INTRAMUSCULAR | Status: DC | PRN
  Administered 2023-01-30: 2 mg via INTRAVENOUS

## 2023-01-30 MED ORDER — INSULIN ASPART 100 UNIT/ML IJ SOLN
0.0000 [IU] | INTRAMUSCULAR | Status: DC | PRN
  Administered 2023-01-30: 4 [IU] via SUBCUTANEOUS

## 2023-01-30 MED ORDER — LIDOCAINE HCL (CARDIAC) PF 100 MG/5ML IV SOSY
PREFILLED_SYRINGE | INTRAVENOUS | Status: DC | PRN
  Administered 2023-01-30: 80 mg via INTRAVENOUS

## 2023-01-30 MED ORDER — PANTOPRAZOLE SODIUM 40 MG PO TBEC
40.0000 mg | DELAYED_RELEASE_TABLET | Freq: Every day | ORAL | Status: DC
  Administered 2023-01-30 – 2023-02-01 (×3): 40 mg via ORAL
  Filled 2023-01-30 (×3): qty 1

## 2023-01-30 MED ORDER — ACETAMINOPHEN 10 MG/ML IV SOLN: 1000.0000 mg | Freq: Once | INTRAVENOUS | Status: DC | PRN

## 2023-01-30 MED ORDER — SODIUM CHLORIDE (PF) 0.9 % IJ SOLN
INTRAMUSCULAR | Status: AC
  Filled 2023-01-30: qty 20

## 2023-01-30 MED ORDER — SODIUM CHLORIDE (PF) 0.9 % IJ SOLN
INTRAMUSCULAR | Status: DC | PRN
  Administered 2023-01-30: 40 mL

## 2023-01-30 MED ORDER — GLYCOPYRROLATE 0.2 MG/ML IJ SOLN
INTRAMUSCULAR | Status: AC
  Filled 2023-01-30: qty 1

## 2023-01-30 MED ORDER — FENTANYL CITRATE PF 50 MCG/ML IJ SOSY
PREFILLED_SYRINGE | INTRAMUSCULAR | Status: AC
  Administered 2023-01-30: 50 ug via INTRAVENOUS
  Filled 2023-01-30: qty 2

## 2023-01-30 MED ORDER — PROPOFOL 10 MG/ML IV BOLUS
INTRAVENOUS | Status: DC | PRN
  Administered 2023-01-30: 150 mg via INTRAVENOUS

## 2023-01-30 MED ORDER — MAGNESIUM CITRATE PO SOLN: 1.0000 | Freq: Once | ORAL | Status: DC

## 2023-01-30 SURGICAL SUPPLY — 65 items
ADH SKN CLS APL DERMABOND .7 (GAUZE/BANDAGES/DRESSINGS) ×1
APL PRP STRL LF DISP 70% ISPRP (MISCELLANEOUS) ×1
BAG COUNTER SPONGE SURGICOUNT (BAG) ×1 IMPLANT
BAG LAPAROSCOPIC 12 15 PORT 16 (BASKET) ×1 IMPLANT
BAG RETRIEVAL 12/15 (BASKET) ×1
BAG SPNG CNTER NS LX DISP (BAG) ×1
CHLORAPREP W/TINT 26 (MISCELLANEOUS) ×1 IMPLANT
CLIP LIGATING HEM O LOK PURPLE (MISCELLANEOUS) ×1 IMPLANT
CLIP LIGATING HEMO LOK XL GOLD (MISCELLANEOUS) ×1 IMPLANT
CLIP LIGATING HEMO O LOK GREEN (MISCELLANEOUS) ×1 IMPLANT
COVER SURGICAL LIGHT HANDLE (MISCELLANEOUS) ×1 IMPLANT
COVER TIP SHEARS 8 DVNC (MISCELLANEOUS) ×1 IMPLANT
CUTTER ECHEON FLEX ENDO 45 340 (ENDOMECHANICALS) IMPLANT
DERMABOND ADVANCED .7 DNX12 (GAUZE/BANDAGES/DRESSINGS) ×2 IMPLANT
DRAIN CHANNEL 15F RND FF 3/16 (WOUND CARE) IMPLANT
DRAIN RELI 100 BL SUC LF ST (DRAIN)
DRAPE ARM DVNC X/XI (DISPOSABLE) ×4 IMPLANT
DRAPE COLUMN DVNC XI (DISPOSABLE) ×1 IMPLANT
DRAPE INCISE IOBAN 66X45 STRL (DRAPES) ×1 IMPLANT
DRAPE SHEET LG 3/4 BI-LAMINATE (DRAPES) ×1 IMPLANT
DRIVER NDL LRG 8 DVNC XI (INSTRUMENTS) ×2 IMPLANT
DRIVER NDLE LRG 8 DVNC XI (INSTRUMENTS)
ELECT PENCIL ROCKER SW 15FT (MISCELLANEOUS) ×1 IMPLANT
ELECT REM PT RETURN 15FT ADLT (MISCELLANEOUS) ×1 IMPLANT
EVACUATOR SILICONE 100CC (DRAIN) IMPLANT
FORCEPS BPLR FENES DVNC XI (FORCEP) ×1 IMPLANT
FORCEPS PROGRASP DVNC XI (FORCEP) ×1 IMPLANT
GLOVE BIO SURGEON STRL SZ 6.5 (GLOVE) ×1 IMPLANT
GLOVE SURG LX STRL 7.5 STRW (GLOVE) ×2 IMPLANT
GOWN STRL REUS W/ TWL XL LVL3 (GOWN DISPOSABLE) ×2 IMPLANT
GOWN STRL REUS W/TWL XL LVL3 (GOWN DISPOSABLE) ×2
GOWN STRL SURGICAL XL XLNG (GOWN DISPOSABLE) ×1 IMPLANT
HOLDER FOLEY CATH W/STRAP (MISCELLANEOUS) ×1 IMPLANT
IRRIG SUCT STRYKERFLOW 2 WTIP (MISCELLANEOUS) ×1
IRRIGATION SUCT STRKRFLW 2 WTP (MISCELLANEOUS) ×1 IMPLANT
KIT BASIN OR (CUSTOM PROCEDURE TRAY) ×1 IMPLANT
KIT TURNOVER KIT A (KITS) IMPLANT
LOOP VESSEL MAXI BLUE (MISCELLANEOUS) IMPLANT
NDL INSUFFLATION 14GA 120MM (NEEDLE) ×1 IMPLANT
NEEDLE INSUFFLATION 14GA 120MM (NEEDLE) ×1
PORT ACCESS TROCAR AIRSEAL 12 (TROCAR) ×1 IMPLANT
PROTECTOR NERVE ULNAR (MISCELLANEOUS) ×2 IMPLANT
RELOAD STAPLE 45 2.6 WHT THIN (STAPLE) IMPLANT
SCISSORS MNPLR CVD DVNC XI (INSTRUMENTS) ×1 IMPLANT
SEAL UNIV 5-12 XI (MISCELLANEOUS) ×4 IMPLANT
SET TRI-LUMEN FLTR TB AIRSEAL (TUBING) ×1 IMPLANT
SOL ELECTROSURG ANTI STICK (MISCELLANEOUS) ×1
SOLUTION ELECTROSURG ANTI STCK (MISCELLANEOUS) ×1 IMPLANT
SPIKE FLUID TRANSFER (MISCELLANEOUS) ×1 IMPLANT
SPONGE T-LAP 4X18 ~~LOC~~+RFID (SPONGE) IMPLANT
STAPLE RELOAD 45 WHT (STAPLE) ×6
STAPLER POWER ECHELON 45 WIDE (STAPLE) IMPLANT
SUT ETHILON 3 0 PS 1 (SUTURE) IMPLANT
SUT MNCRL AB 4-0 PS2 18 (SUTURE) ×2 IMPLANT
SUT NOVA NAB GS-21 0 18 T12 DT (SUTURE) IMPLANT
SUT PDS AB 1 CT1 27 (SUTURE) ×3 IMPLANT
SUT VIC AB 2-0 SH 27 (SUTURE) ×1
SUT VIC AB 2-0 SH 27X BRD (SUTURE) ×1 IMPLANT
SUT VICRYL 0 UR6 27IN ABS (SUTURE) IMPLANT
TOWEL OR 17X26 10 PK STRL BLUE (TOWEL DISPOSABLE) ×1 IMPLANT
TRAY FOLEY MTR SLVR 16FR STAT (SET/KITS/TRAYS/PACK) ×1 IMPLANT
TRAY LAPAROSCOPIC (CUSTOM PROCEDURE TRAY) ×1 IMPLANT
TROCAR Z THREAD OPTICAL 12X100 (TROCAR) ×1 IMPLANT
TROCAR Z-THREAD OPTICAL 5X100M (TROCAR) IMPLANT
WATER STERILE IRR 1000ML POUR (IV SOLUTION) ×1 IMPLANT

## 2023-01-30 NOTE — Transfer of Care (Signed)
Immediate Anesthesia Transfer of Care Note  Patient: Justin Hammond  Procedure(s) Performed: XI ROBOTIC ASSISTED LEFT LAPAROSCOPIC RADICAL NEPHRECTOMY AND RETROPERITONEAL NODE DISSECTION (Left)  Patient Location: PACU  Anesthesia Type:General  Level of Consciousness: awake and alert   Airway & Oxygen Therapy: Patient Spontanous Breathing and Patient connected to face mask oxygen  Post-op Assessment: Report given to RN and Post -op Vital signs reviewed and stable  Post vital signs: Reviewed and stable  Last Vitals:  Vitals Value Taken Time  BP 166/91 01/30/23 1228  Temp    Pulse 72 01/30/23 1229  Resp 18 01/30/23 1229  SpO2 100 % 01/30/23 1229  Vitals shown include unfiled device data.  Last Pain:  Vitals:   01/30/23 0649  TempSrc: Oral  PainSc:       Patients Stated Pain Goal: 4 (01/30/23 4098)  Complications: No notable events documented.

## 2023-01-30 NOTE — Anesthesia Procedure Notes (Signed)
Procedure Name: Intubation Date/Time: 01/30/2023 9:18 AM  Performed by: Deri Fuelling, CRNAPre-anesthesia Checklist: Patient identified, Emergency Drugs available, Suction available and Patient being monitored Patient Re-evaluated:Patient Re-evaluated prior to induction Oxygen Delivery Method: Circle system utilized Preoxygenation: Pre-oxygenation with 100% oxygen Induction Type: IV induction Ventilation: Mask ventilation without difficulty Laryngoscope Size: Glidescope and 4 Grade View: Grade I Tube type: Oral Tube size: 7.5 mm Number of attempts: 1 Airway Equipment and Method: Stylet and Oral airway Placement Confirmation: ETT inserted through vocal cords under direct vision, positive ETCO2 and breath sounds checked- equal and bilateral Secured at: 23 cm Tube secured with: Tape Dental Injury: Teeth and Oropharynx as per pre-operative assessment

## 2023-01-30 NOTE — Anesthesia Postprocedure Evaluation (Signed)
Anesthesia Post Note  Patient: Justin Hammond  Procedure(s) Performed: XI ROBOTIC ASSISTED LEFT LAPAROSCOPIC RADICAL NEPHRECTOMY AND RETROPERITONEAL NODE DISSECTION (Left)     Patient location during evaluation: PACU Anesthesia Type: General Level of consciousness: awake and alert Pain management: pain level controlled Vital Signs Assessment: post-procedure vital signs reviewed and stable Respiratory status: spontaneous breathing, nonlabored ventilation, respiratory function stable and patient connected to nasal cannula oxygen Cardiovascular status: blood pressure returned to baseline and stable Postop Assessment: no apparent nausea or vomiting Anesthetic complications: no   No notable events documented.  Last Vitals:  Vitals:   01/30/23 1500 01/30/23 1520  BP: (!) 148/83 (!) 166/85  Pulse: 73 70  Resp: 15 20  Temp:  36.7 C  SpO2: 94% 96%    Last Pain:  Vitals:   01/30/23 1520  TempSrc: Oral  PainSc:                  Mariann Barter

## 2023-01-30 NOTE — Progress Notes (Signed)
   01/30/23 2148  BiPAP/CPAP/SIPAP  BiPAP/CPAP/SIPAP Pt Type Adult  BiPAP/CPAP/SIPAP DREAMSTATIOND  Mask Type Full face mask (from home)  FiO2 (%) 21 %  Patient Home Equipment No (Mask and tubing from home)  Auto Titrate Yes (8-25)  BiPAP/CPAP /SiPAP Vitals  Pulse Rate 89  Resp 17  SpO2 96 %  Bilateral Breath Sounds Diminished;Clear  MEWS Score/Color  MEWS Score 0  MEWS Score Color Green

## 2023-01-30 NOTE — Discharge Instructions (Addendum)
1- Drain Sites - You may have some mild persistent drainage from old drain site for several days, this is normal. This can be covered with cotton gauze for convenience.  2 - Stiches - Your stitches are all dissolvable. You may notice a "loose thread" at your incisions, these are normal and require no intervention. You may cut them flush to the skin with fingernail clippers if needed for comfort.  3 - Diet - No restrictions  4 - Activity - No heavy lifting / straining (any activities that require valsalva or "bearing down") x 4 weeks. Otherwise, no restrictions.  5 - Bathing - You may shower immediately. Do not take a bath or get into swimming pool where incision sites are submersed in water x 4 weeks.   6 - When to Call the Doctor - Call MD for any fever >102, any acute wound problems, or any severe nausea / vomiting. You can call the [facility] [phone]) 24 hours a day 365 days a year. It will roll-over to the answering service and on-call physician after hours.   You may resume aspirin, advil, aleve, vitamins, and supplements 7 days after surgery.  You may resume Ozempic 2 weeks after surgery.

## 2023-01-30 NOTE — Brief Op Note (Signed)
01/30/2023  12:10 PM  PATIENT:  Justin Hammond  57 y.o. male  PRE-OPERATIVE DIAGNOSIS:  LARGE LEFT RENAL MASS  POST-OPERATIVE DIAGNOSIS:  LARGE LEFT RENAL MASS  PROCEDURE:  Procedure(s) with comments: XI ROBOTIC ASSISTED LEFT LAPAROSCOPIC RADICAL NEPHRECTOMY AND RETROPERITONEAL NODE DISSECTION (Left) - 180 MINUTES  SURGEON:  Surgeons and Role:    * Arzu Mcgaughey, Delbert Phenix., MD - Primary  PHYSICIAN ASSISTANT:   ASSISTANTS: none   ANESTHESIA:   general  EBL:  400 mL   BLOOD ADMINISTERED:none  DRAINS:  foley to gravity    LOCAL MEDICATIONS USED:  MARCAINE     SPECIMEN:  Source of Specimen:  left kidney with peri-hilar lymph nodes; additional hilar lymph nodes  DISPOSITION OF SPECIMEN:  PATHOLOGY  COUNTS:  YES  TOURNIQUET:  * No tourniquets in log *  DICTATION: .Other Dictation: Dictation Number [id number]  PLAN OF CARE: Admit to inpatient   PATIENT DISPOSITION:  PACU - hemodynamically stable.   Delay start of Pharmacological VTE agent (>24hrs) due to surgical blood loss or risk of bleeding: yes

## 2023-01-30 NOTE — H&P (Signed)
Justin Hammond is an 57 y.o. male.    Chief Complaint: Pre-OP LEFT Radical Nephrectomy / Node Dissection  HPI:   1 - Large Left Renal CAncer - 10cm left cortical solid, enhancing renal mass by CT at [facility] 11/[facility]22. CXR negative. No bulky adenopathy, some shoddy hilar nodes. 1 artery / 1 very (large lumbar inferior to artery) left renovascualr anatomy. Baseline Cr 0.8.   2 - Minimally Complex Rt Renal Cysts - cortical cyst 2cm x2 on CT 2024. No solid componennts or mass effect.   3 - Lower Urinary Tract Symptoms - s/p TUMT 2009 and 2018 on tamsulosin + dutasteride at baseline per Dr. Lonna Hammond. Prostate vol 35gm (not enalrged) by CT ellipsoid calculation 2024. Was consdiering repeat outlet procedrure with HoOLEP with Dr. Richardo Hammond at some point.    PMH sig for CAD /Stent (follows kernodle cards, not limiting, no strong blood thinners), open appy as child, Obesity. 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 PCP is Dr. Arlana Hammond in [place].   Today " Justin Hammond" is seen to proceed with LEFT radical nephrecomy / node dissection. No interval fevers. Cr 1.1, Hgb 14.4.    Past Medical History:  Diagnosis Date   Arthritis    Cancer (HCC)    bladder cancer   Coronary artery disease    Stent x 1   Diabetes mellitus without complication (HCC)    type 2   GERD (gastroesophageal reflux disease)    Hyperlipidemia    Myocardial infarction (HCC)    Neuromuscular disorder (HCC)    neuropathy feet   Proteinuria    Sleep apnea    wears Cpap    Past Surgical History:  Procedure Laterality Date   APPENDECTOMY     CORONARY STENT INTERVENTION N/A 12/28/2016   Procedure: CORONARY STENT INTERVENTION;  Surgeon: Dalia Heading, MD;  Location: [facility] INVASIVE CV LAB;  Service: Cardiovascular;  Laterality: N/A;   CORONARY STENT INTERVENTION N/A 12/28/2016   Procedure: CORONARY STENT INTERVENTION;  Surgeon: Marcina Millard, MD;  Location: [facility];  Service:  Cardiovascular;  Laterality: N/A;   LEFT HEART CATH AND CORONARY ANGIOGRAPHY N/A 12/28/2016   Procedure: LEFT HEART CATH AND CORONARY ANGIOGRAPHY;  Surgeon: Dalia Heading, MD;  Location: [facility] INVASIVE CV LAB;  Service: Cardiovascular;  Laterality: N/A;    Family History  Problem Relation Age of Onset   Breast cancer Mother    CAD Father    Social History:  reports that he has never smoked. He has never used smokeless tobacco. He reports that he does not drink alcohol and does not use drugs.  Allergies:  Allergies  Allergen Reactions   Penicillins Itching    Tolerated cefepime 11/2022    Medications Prior to Admission  Medication Sig Dispense Refill   aspirin EC 81 MG tablet Take 81 mg by mouth daily. Swallow whole.     atorvastatin (LIPITOR) 40 MG tablet Take 1 tablet (40 mg total) by mouth daily. 30 tablet 1   carvedilol (COREG) 3.125 MG tablet Take 1 tablet (3.125 mg total) by mouth 2 (two) times daily with a meal. 60 tablet 1   dutasteride (AVODART) 0.5 MG capsule Take 1 capsule (0.5 mg total) by mouth daily. 90 capsule 3   gabapentin (NEURONTIN) 400 MG capsule Take 1 capsule by mouth 3 (three) times daily.     glimepiride (AMARYL) 4 MG tablet Take 2-4 mg by mouth See admin instructions. Take 4 mg by mouth  in the morning and 2 mg at night     JARDIANCE 25 MG TABS tablet Take 25 mg by mouth every morning.     losartan (COZAAR) 100 MG tablet Take 1 tablet by mouth daily.     metFORMIN (GLUCOPHAGE) 500 MG tablet Take 1,000 mg by mouth 2 (two) times daily.     NON FORMULARY Pt uses a cpap nightly     omeprazole (PRILOSEC) 40 MG capsule Take 40 mg by mouth daily.     Semaglutide, 1 MG/DOSE, (OZEMPIC, 1 MG/DOSE,) 4 MG/3ML SOPN Inject 1 mg into the skin once a week.     tamsulosin (FLOMAX) 0.4 MG CAPS capsule Take 1 capsule (0.4 mg total) by mouth daily. 90 capsule 3   levofloxacin (LEVAQUIN) 750 MG tablet Take 1 tablet (750 mg total) by mouth at bedtime. (Patient not taking: Reported  on 01/16/2023) 5 tablet 0   nicotine (NICODERM CQ - DOSED IN MG/24 HOURS) 14 mg/24hr patch One 14 mg patch chest wall daily (okay to substitute generic) (Patient not taking: Reported on 01/16/2023) 28 patch 0    No results found for this or any previous visit (from the past 48 hour(s)). No results found.  Review of Systems  Constitutional:  Negative for chills and fever.  All other systems reviewed and are negative.   There were no vitals taken for this visit. Physical Exam Vitals reviewed.  HENT:     Head: Normocephalic.     Nose: Nose normal.  Eyes:     Pupils: Pupils are equal, round, and reactive to light.  Cardiovascular:     Rate and Rhythm: Normal rate.  Abdominal:     General: Abdomen is flat.     Comments: Stable large truncal obesity.   Genitourinary:    Comments: No CVAT at present Musculoskeletal:        General: Normal range of motion.     Cervical back: Normal range of motion.  Neurological:     General: No focal deficit present.     Mental Status: He is alert.  Psychiatric:        Mood and Affect: Mood normal.      Assessment/Plan  Proceed as planned with LEFT radical nephrectomy / node dissection. Risks, benefits, alternatives, expected peri-op course discussed previously and reiterated today.   Loletta Parish., MD 01/30/2023, 6:35 AM

## 2023-01-30 NOTE — Anesthesia Procedure Notes (Signed)
Arterial Line Insertion Start/End9/03/2023 8:50 AM, 01/30/2023 8:51 AM Performed by: Mariann Barter, MD, anesthesiologist  Patient location: OR. Preanesthetic checklist: patient identified, IV checked, site marked, risks and benefits discussed, surgical consent, monitors and equipment checked, pre-op evaluation, timeout performed and anesthesia consent Lidocaine 1% used for infiltration radial was placed Catheter size: 20 G Hand hygiene performed  and maximum sterile barriers used   Attempts: 1 Procedure performed without using ultrasound guided technique. Following insertion, dressing applied and Biopatch. Post procedure assessment: normal and unchanged  Patient tolerated the procedure well with no immediate complications.

## 2023-01-30 NOTE — Op Note (Unsigned)
NAME: Justin Hammond, Justin Hammond MEDICAL RECORD NO: [id number] ACCOUNT NO: [id number] DATE OF BIRTH: [birth year] FACILITY: [facility] LOCATION: [facility] PHYSICIAN: Sebastian Ache, MD  Operative Report   DATE OF PROCEDURE: 01/30/2023  PREOPERATIVE DIAGNOSIS:  Large left renal mass with shotty perihilar adenopathy.  POSTOPERATIVE DIAGNOSIS:  Large left renal mass with shotty perihilar adenopathy.  PROCEDURE:  Left robotic radical nephrectomy with retroperitoneal lymph node dissection.  ASSISTANT:  Harrie Foreman, PA  ESTIMATED BLOOD LOSS:  100 mL.  COMPLICATIONS:  None.  SPECIMEN: 1.  Left kidney with perihilar lymph nodes and adrenal en bloc. 2.  Additional perihilar lymph nodes for pathology.  FINDINGS:   1.  Very large left renal neoplasm. 2.  Single artery, single vein with large lumbar vein left renovascular anatomy dissipated. 3.  Numerous parasitic vessels.  INDICATIONS:  The patient is a very pleasant 57 year old man.  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  Staging imaging was localized although he does have some small perihilar adenopathy.  It is felt to be reactive. Possible risk management including recommended path, surgical extirpation with curative intent with left radical nephrectomy and  retroperitoneal lymph node dissection and he presents for this today.  Informed consent was obtained and placed in medical record.  PROCEDURE IN DETAIL:  The patient being identified and verified. Procedure being left radical nephrectomy with lymph node dissection was confirmed.  Procedure timeout was performed and IV antibiotics administered.  General endotracheal anesthesia  induced.  Foley catheter placed per urethra to straight drain.  The patient was placed in the left side up full flank position pulling 15 degrees of table flexion,  superior arm elevator, axillary roll, sequential compression devices, bottom leg bent, top  leg straight.  He was further fastened to operating table using 3-inch tape with foam padding across supraxiphoid chest and his pelvis.  Sterile field was created by first clipper shaving then prepping and draping the patient's entire left flank and  abdomen using chlorhexidine gluconate. High flow, low pressure, pneumoperitoneum was obtained using Veress technique in left lower quadrant, having passed the aspiration and drop test.  Next, an 8 mm robotic camera port was placed in position  approximately two handbreadths superolateral to the umbilicus.  Laparoscopic examination of peritoneal cavity demonstrate no significant adhesions, no visceral injury.  There was significant visceral adiposity as anticipated.  Additional ports were  placed as follows:  Left subcostal 8 mm robotic port, left far lateral 8 mm robotic port, approximately 1 handbreadth superior medial to the anterior iliac spine, left paramedian inferior robotic port, approximately one and half handbreadth superior to  the pubic ramus and two 12 mm assistant port sites in the paramedian location one approximately 4 fingerbreadths superior medial to the camera port and another approximately 4 fingerbreadths inferomedial to the camera port, superior one being AirSeal  type.  Robot was docked and passed the electronic checks.  Initial attention was directed at development of retroperitoneum. Incision made lateral to the descending colon from the area of the splenic flexure towards the area of the internal ring and the  colon was carefully swept medially.  The mesentery of the colon was quite thick, given his visceral adiposity as anticipated.  This was carefully swept medially.  Lateral splenic attachments were taken down, allowing the spleen to rotate medially on its  own weight  and the plane between the anterior superior surface of Gerota's fascia and  the posterior surface of the pancreas was carefully developed allowing the pancreas to rotate with the spleen superomedially.  The mass in question was very, very large  actually somewhat larger than anticipated based on renal imaging and at a weight of the neoplasm causing significant rotational distortion of the kidney and the neoplasm rotating in front of the hilar area.  Lower pole kidney area was identified, placed  on gentle lateral traction.  Dissection proceeded medial to this, the ureter and gonadal vessels were encountered placed on gentle lateral traction.  Psoas musculature and aorta were identified.  Dissection proceeded within this triangle towards the  area of the renal hilum.  Renal hilum consisted of a single large caliber artery, single vein left renal vascular anatomy as anticipated with a very large lumbar vein that obscured the traditional inferior window to the artery.  Retraction was difficult  given the rotational component of the neoplasm, but all lesion of the hilar structures were safely visualized.  The large lumbar vessel was controlled using vascular stapler, which then allowed a much safer window to the area of the left renal artery was  controlled similarly using vascular stapler followed by the left renal vein, which had become significantly more flat following arterial control, also controlled using vascular stapler.  I was quite happy with the safety and hemostasis of hilar control.   Notably, a plane of dissection was chosen very close to the area of the aorta, thus performing the lymph node dissection along with the radical nephrectomy specimen.  The superior medial plane was further developed in a plane and the remaining very  close to the aorta from external lymph node dissection and the adrenal was also included with the kidney.  This was carried all the way to the area of the diaphragm. Superior attachments were then taken down as were lateral attachments.  There were   numerous small parasitic vessels, most of which were amenable to a bipolar energy several did require dedicated vascular clipping. Inferiorly, the ureter was doubly clipped and ligated as was the gonadal vein.  This completely freed up the very very  large left renal neoplasm with adrenal gland and perihilar lymph nodes en bloc.  The area of the hilum and the entire medial plane was carefully inspected as was the entire nephrectomy bed was found to be hemostatic.  Inspection of the area of the renal  hilum did appear to reveal some additional lymphatic tissue close to the area of the hilum.  This was further mobilized.  Lymphostasis was achieved with cold clips set aside labeled additional periaortic lymph nodes.  Portion of these appeared to be  pathologically enlarged.  We achieved the goals extirpated portion of the surgery today.  Robot was undocked.  Specimen was retrieved by connecting the previous assistant port sites in the midline and actually extending inferiorly for a total distance of  approximately 9 cm and removing the large nephrectomy specimen setting aside for permanent pathology.  The patient does have a significant lifting component to his day job, the fascia was carefully reapproximated using figure-of-eight Novafil times  approximately 8 followed by reapproximation of Scarpa's with running Vicryl.  All incision sites were infiltrated with dilute lipolyzed Marcaine and closed at the level of skin using subcuticular Monocryl followed by Dermabond.  Procedure was then  terminated.  The patient tolerated the procedure well, no immediate periprocedural complications.  The patient was  taken to postanesthesia care in stable condition.  Plan for inpatient admission.  Please note, first assistant, Harrie Foreman, was crucial for all portions of the surgery today.  She provided invaluable retraction, suctioning, vascular clipping, vascular stapling, lymphatic clipping, robotic instrument  exchange and general first  assistance.   PUS D: 01/30/2023 12:20:01 pm T: 01/30/2023 1:16:00 pm  JOB: [id number]/ [id number]

## 2023-01-31 ENCOUNTER — Encounter (HOSPITAL_COMMUNITY): Payer: Self-pay | Admitting: Urology

## 2023-01-31 LAB — BASIC METABOLIC PANEL
Anion gap: 9 (ref 5–15)
BUN: 22 mg/dL — ABNORMAL HIGH (ref 6–20)
CO2: 22 mmol/L (ref 22–32)
Calcium: 8.4 mg/dL — ABNORMAL LOW (ref 8.9–10.3)
Chloride: 100 mmol/L (ref 98–111)
Creatinine, Ser: 1.44 mg/dL — ABNORMAL HIGH (ref 0.61–1.24)
GFR, Estimated: 56 mL/min — ABNORMAL LOW (ref >60)
Glucose, Bld: 205 mg/dL — ABNORMAL HIGH (ref 70–99)
Potassium: 4.4 mmol/L (ref 3.5–5.1)
Sodium: 131 mmol/L — ABNORMAL LOW (ref 135–145)

## 2023-01-31 LAB — HEMOGLOBIN AND HEMATOCRIT, BLOOD
HCT: 44.2 % (ref 39.0–52.0)
Hemoglobin: 14.1 g/dL (ref 13.0–17.0)

## 2023-01-31 LAB — GLUCOSE, CAPILLARY
Glucose-Capillary: 212 mg/dL — ABNORMAL HIGH (ref 70–99)
Glucose-Capillary: 240 mg/dL — ABNORMAL HIGH (ref 70–99)
Glucose-Capillary: 260 mg/dL — ABNORMAL HIGH (ref 70–99)
Glucose-Capillary: 348 mg/dL — ABNORMAL HIGH (ref 70–99)

## 2023-01-31 MED ORDER — ACETAMINOPHEN 500 MG PO TABS
1000.0000 mg | ORAL_TABLET | Freq: Four times a day (QID) | ORAL | Status: DC
  Administered 2023-01-31 – 2023-02-01 (×4): 1000 mg via ORAL
  Filled 2023-01-31 (×4): qty 2

## 2023-01-31 MED ORDER — CALCIUM CARBONATE ANTACID 500 MG PO CHEW
1.0000 | CHEWABLE_TABLET | Freq: Four times a day (QID) | ORAL | Status: DC | PRN
  Administered 2023-01-31: 200 mg via ORAL
  Filled 2023-01-31: qty 1

## 2023-01-31 MED ORDER — CHLORHEXIDINE GLUCONATE CLOTH 2 % EX PADS
6.0000 | MEDICATED_PAD | Freq: Every day | CUTANEOUS | Status: DC
  Administered 2023-01-31: 6 via TOPICAL

## 2023-01-31 NOTE — Progress Notes (Signed)
   01/31/23 1951  BiPAP/CPAP/SIPAP  BiPAP/CPAP/SIPAP Pt Type Adult (prefers self placement)  BiPAP/CPAP/SIPAP DREAMSTATIOND  Mask Type Full face mask (from home)  FiO2 (%) 21 %  Patient Home Equipment No (mask and tubing from home)  Auto Titrate Yes (8-25)  CPAP/SIPAP surface wiped down Yes  BiPAP/CPAP /SiPAP Vitals  Pulse Rate 79  Resp 18  SpO2 99 %  Bilateral Breath Sounds Diminished  MEWS Score/Color  MEWS Score 0  MEWS Score Color Green

## 2023-01-31 NOTE — Plan of Care (Signed)
  Problem: Coping: Goal: Ability to adjust to condition or change in health will improve Outcome: Progressing   Problem: Health Behavior/Discharge Planning: Goal: Ability to identify and utilize available resources and services will improve Outcome: Progressing   Problem: Skin Integrity: Goal: Risk for impaired skin integrity will decrease Outcome: Progressing   Problem: Clinical Measurements: Goal: Postoperative complications will be avoided or minimized Outcome: Progressing

## 2023-01-31 NOTE — Progress Notes (Signed)
1 Day Post-Op   Subjective/Chief Complaint:  1 - Large Left Renal Neoplasm - s/p LEFT robotic radical nephrectomy 01/30/23. Path pending. POD 1 Hgb 14, Cr 1.4.  Today "[name] is progressing. Still with some abd and shoulder soreness but ambulatory. Voiding with catheter out this AM. NO emesis.    Objective: Vital signs in last 24 hours: Temp:  [97.5 F (36.4 C)-98 F (36.7 C)] 97.8 F (36.6 C) (09/12 1120) Pulse Rate:  [55-89] 74 (09/12 1120) Resp:  [15-20] 20 (09/12 1120) BP: (123-166)/(68-85) 152/72 (09/12 1120) SpO2:  [94 %-100 %] 97 % (09/12 1120) FiO2 (%):  [21 %] 21 % (09/11 2148) Last BM Date : 01/29/23  Intake/Output from previous day: 09/11 0701 - 09/12 0700 In: 2274.9 [P.O.:240; I.V.:1634.9; IV Piggyback:400] Out: 3250 [Urine:2850; Blood:400] Intake/Output this shift: Total I/O In: 240 [P.O.:240] Out: 400 [Urine:400]  NAD, wearing glasses Non-labored breathign on RA RRR Stable large truncal obesity Recent prior surgical sites c/d/I Foley out SCD's in place  Lab Results:  Recent Labs    01/30/23 1312 01/31/23 0417  HGB 14.9 14.1  HCT 46.2 44.2   BMET Recent Labs    01/31/23 0417  NA 131*  K 4.4  CL 100  CO2 22  GLUCOSE 205*  BUN 22*  CREATININE 1.44*  CALCIUM 8.4*   PT/INR No results for input(s): "LABPROT", "INR" in the last 72 hours. ABG No results for input(s): "PHART", "HCO3" in the last 72 hours.  Invalid input(s): "PCO2", "PO2"  Studies/Results: No results found.  Anti-infectives: Anti-infectives (From admission, onward)    Start     Dose/Rate Route Frequency Ordered Stop   01/30/23 0627  ceFAZolin (ANCEF) IVPB 3g/100 mL premix  Status:  Discontinued        3 g 200 mL/hr over 30 Minutes Intravenous 30 min pre-op 01/30/23 0865 01/30/23 1510       Assessment/Plan:  Goals for DC discussed. He is close, but rec one more night as he has very little support at home.    Loletta Parish. 01/31/2023

## 2023-01-31 NOTE — Plan of Care (Addendum)
  Problem: Bowel/Gastric: Goal: Gastrointestinal status for postoperative course will improve Outcome: Progressing   Problem: Clinical Measurements: Goal: Postoperative complications will be avoided or minimized Outcome: Progressing   Problem: Urinary Elimination: Goal: Ability to avoid or minimize complications of infection will improve Outcome: Progressing

## 2023-01-31 NOTE — TOC CM/SW Note (Signed)
Transition of Care [facility]) - Inpatient Brief Assessment   Patient Details  Name: Justin Hammond MRN: [id number] Date of Birth: 1965/09/06  [facility] of [facility]) CM/SW Contact:    Larrie Kass, LCSW Phone Number: 01/31/2023, 2:39 PM     Transition of Care Asessment: Insurance and Status: Insurance coverage has been reviewed Patient has primary care physician: Yes Home environment has been reviewed: home with self Prior level of function:: independent / working Forensic psychologist: No current home services Social Determinants of Health Reivew: SDOH reviewed no interventions necessary Readmission risk has been reviewed: Yes Transition of care needs: no transition of care needs at this time

## 2023-02-01 LAB — GLUCOSE, CAPILLARY
Glucose-Capillary: 227 mg/dL — ABNORMAL HIGH (ref 70–99)
Glucose-Capillary: 241 mg/dL — ABNORMAL HIGH (ref 70–99)

## 2023-02-01 NOTE — Plan of Care (Signed)
  Problem: Education: Goal: Ability to describe self-care measures that may prevent or decrease complications (Diabetes Survival Skills Education) will improve Outcome: Progressing Goal: Individualized Educational Video(s) Outcome: Progressing   Problem: Fluid Volume: Goal: Ability to maintain a balanced intake and output will improve Outcome: Progressing   Problem: Health Behavior/Discharge Planning: Goal: Ability to identify and utilize available resources and services will improve Outcome: Progressing Goal: Ability to manage health-related needs will improve Outcome: Progressing   Problem: Nutritional: Goal: Maintenance of adequate nutrition will improve Outcome: Progressing Goal: Progress toward achieving an optimal weight will improve Outcome: Progressing   Problem: Skin Integrity: Goal: Risk for impaired skin integrity will decrease Outcome: Progressing   Problem: Tissue Perfusion: Goal: Adequacy of tissue perfusion will improve Outcome: Progressing

## 2023-02-01 NOTE — Progress Notes (Signed)
Patient needing home transportation assistance.Transition of care LCSW notified, who made arrangement for patient's transportation, patient informed. Waiting for safe transport to pick patient up. Discharge instructions given and explained to patient, he verbalized understanding. Patient denies any pain/distress. Surgical incision clean/dry/intact. No sign of infection noted.

## 2023-02-01 NOTE — Discharge Summary (Signed)
Physician Discharge Summary  Patient ID: Justin Hammond MRN: [id number] DOB/AGE: [birth year] 57 y.o.  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 Path pending at discharge. POD 1 Hgb 14, Cr 1.4. By the late morning of POD 2, the day of discharge, he is ambulatory, tolerating regular diet, pain manageable on PO meds, and felt to be adequate for discharge.   Consults: None  Significant Diagnostic Studies: labs: as per above  Treatments: surgery: as per above  Discharge Exam: Blood pressure 132/80, pulse 64, temperature 97.8 F (36.6 C), temperature source Oral, resp. rate 16, height 5\' 8"  (1.727 m), weight 129.5 kg, SpO2 95%.  NAD, wearing glasses, sitting in chair Non-labored breathign on RA RRR Stable large truncal obesity Recent prior surgical sites c/d/I Foley out SCD's in place  Disposition: HOME   Allergies as of 02/01/2023       Reactions   Penicillins Itching   Tolerated cefepime 11/2022        Medication List     STOP taking these medications    aspirin EC 81 MG tablet   levofloxacin 750 MG tablet Commonly known as: Levaquin   Ozempic (1 MG/DOSE) 4 MG/3ML Sopn Generic drug: Semaglutide (1 MG/DOSE)       TAKE these medications    atorvastatin 40 MG tablet Commonly known as: LIPITOR Take 1 tablet (40 mg total) by mouth daily.   carvedilol 3.125 MG tablet Commonly known as: COREG Take 1 tablet (3.125 mg total) by mouth 2 (two) times daily with a meal.   docusate sodium 100 MG capsule Commonly known as: COLACE Take 1 capsule (100 mg total) by mouth 2 (two) times daily.   dutasteride 0.5 MG capsule Commonly known as: AVODART Take 1 capsule (0.5 mg total) by mouth daily.   gabapentin 400 MG  capsule Commonly known as: NEURONTIN Take 1 capsule by mouth 3 (three) times daily.   glimepiride 4 MG tablet Commonly known as: AMARYL Take 2-4 mg by mouth See admin instructions. Take 4 mg by mouth in the morning and 2 mg at night   HYDROcodone-acetaminophen 5-325 MG tablet Commonly known as: Norco Take 1-2 tablets by mouth every 6 (six) hours as needed for moderate pain or severe pain.   Jardiance 25 MG Tabs tablet Generic drug: empagliflozin Take 25 mg by mouth every morning.   losartan 100 MG tablet Commonly known as: COZAAR Take 1 tablet by mouth daily.   metFORMIN 500 MG tablet Commonly known as: GLUCOPHAGE Take 1,000 mg by mouth 2 (two) times daily.   nicotine 14 mg/24hr patch Commonly known as: NICODERM CQ - dosed in mg/24 hours One 14 mg patch chest wall daily (okay to substitute generic)   NON FORMULARY Pt uses a cpap nightly   omeprazole 40 MG capsule Commonly known as: PRILOSEC Take 40 mg by mouth daily.   tamsulosin 0.4 MG Caps capsule Commonly known as: FLOMAX Take 1 capsule (0.4 mg total) by mouth daily.        Follow-up Information     Berneice Heinrich Delbert Phenix., MD Follow up on 02/18/2023.   Specialty: Urology Why: at 3:30 for MD visit and pathology review Contact information: [address] [place] [address] [phone]  Signed: Loletta Parish. 02/01/2023, 11:11 AM

## 2023-02-01 NOTE — TOC Transition Note (Signed)
Transition of Care [facility]) - CM/SW Discharge Note   Patient Details  Name: Justin Hammond MRN: [id number] Date of Birth: 1965-11-24  [facility] CM/SW Contact:  Larrie Kass, LCSW Phone Number: 02/01/2023, 12:32 PM   Clinical Narrative:     CSW received message from pt's RN, pt is requesting transportation assistance. Pt reports his cousin is at work and can not pick him up. CSW arranged Safe transport to transport pt home. They will call Nurse station when they arrive. No further TOC needs, TOC sign off.         Patient Goals and CMS Choice      Discharge Placement                         Discharge Plan and Services Additional resources added to the After Visit Summary for                                       Social Determinants of Health (SDOH) Interventions SDOH Screenings   Food Insecurity: No Food Insecurity (01/30/2023)  Recent Concern: Food Insecurity - Food Insecurity Present (01/30/2023)  Housing: Patient Declined (01/30/2023)  Transportation Needs: Unknown (01/30/2023)  Utilities: Not At Risk (01/30/2023)  Depression (PHQ2-9): Low Risk  (12/31/2022)  Tobacco Use: Low Risk  (01/30/2023)  Recent Concern: Tobacco Use - Medium Risk (01/18/2023)   Received from [facility]     Readmission Risk Interventions     No data to display

## 2023-02-04 LAB — SURGICAL PATHOLOGY

## 2023-02-10 ENCOUNTER — Other Ambulatory Visit: Payer: Self-pay

## 2023-02-10 ENCOUNTER — Emergency Department
Admission: EM | Admit: 2023-02-10 | Discharge: 2023-02-10 | Disposition: A | Discharge status: Home / Self Care | Payer: BC Managed Care – PPO | Attending: Emergency Medicine | Admitting: Emergency Medicine

## 2023-02-10 ENCOUNTER — Emergency Department: Payer: BC Managed Care – PPO

## 2023-02-10 DIAGNOSIS — Z1152 Encounter for screening for COVID-19: Secondary | ICD-10-CM | POA: Insufficient documentation

## 2023-02-10 DIAGNOSIS — R42 Dizziness and giddiness: Secondary | ICD-10-CM | POA: Insufficient documentation

## 2023-02-10 LAB — URINALYSIS, ROUTINE W REFLEX MICROSCOPIC
Bacteria, UA: NONE SEEN
Bilirubin Urine: NEGATIVE
Glucose, UA: >=500 mg/dL — AB
Hgb urine dipstick: NEGATIVE
Ketones, ur: NEGATIVE mg/dL
Leukocytes,Ua: NEGATIVE
Nitrite: NEGATIVE
Protein, ur: NEGATIVE mg/dL
Specific Gravity, Urine: 1.033 — ABNORMAL HIGH (ref 1.005–1.030)
pH: 5 (ref 5.0–8.0)

## 2023-02-10 LAB — CBC
HCT: 45 % (ref 39.0–52.0)
Hemoglobin: 14.4 g/dL (ref 13.0–17.0)
MCH: 27.1 pg (ref 26.0–34.0)
MCHC: 32 g/dL (ref 30.0–36.0)
MCV: 84.7 fL (ref 80.0–100.0)
Platelets: 292 10*3/uL (ref 150–400)
RBC: 5.31 MIL/uL (ref 4.22–5.81)
RDW: 14.2 % (ref 11.5–15.5)
WBC: 8.1 10*3/uL (ref 4.0–10.5)
nRBC: 0 % (ref 0.0–0.2)

## 2023-02-10 LAB — BASIC METABOLIC PANEL
Anion gap: 9 (ref 5–15)
BUN: 27 mg/dL — ABNORMAL HIGH (ref 6–20)
CO2: 22 mmol/L (ref 22–32)
Calcium: 9.1 mg/dL (ref 8.9–10.3)
Chloride: 103 mmol/L (ref 98–111)
Creatinine, Ser: 1.79 mg/dL — ABNORMAL HIGH (ref 0.61–1.24)
GFR, Estimated: 43 mL/min — ABNORMAL LOW (ref >60)
Glucose, Bld: 155 mg/dL — ABNORMAL HIGH (ref 70–99)
Potassium: 4.5 mmol/L (ref 3.5–5.1)
Sodium: 134 mmol/L — ABNORMAL LOW (ref 135–145)

## 2023-02-10 LAB — LIPASE, BLOOD: Lipase: 78 U/L — ABNORMAL HIGH (ref 11–51)

## 2023-02-10 LAB — TROPONIN I (HIGH SENSITIVITY): Troponin I (High Sensitivity): 5 ng/L (ref <18)

## 2023-02-10 LAB — SARS CORONAVIRUS 2 BY RT PCR: SARS Coronavirus 2 by RT PCR: NEGATIVE

## 2023-02-10 MED ORDER — IOHEXOL 350 MG/ML SOLN
75.0000 mL | Freq: Once | INTRAVENOUS | Status: AC | PRN
  Administered 2023-02-10: 75 mL via INTRAVENOUS

## 2023-02-10 MED ORDER — SODIUM CHLORIDE 0.9 % IV BOLUS
500.0000 mL | Freq: Once | INTRAVENOUS | Status: AC
  Administered 2023-02-10: 500 mL via INTRAVENOUS

## 2023-02-10 NOTE — ED Provider Notes (Signed)
[facility] Provider Note    Event Date/Time   First MD Initiated Contact with Patient 02/10/23 1647     (approximate)   History   Dizziness   HPI  Justin Hammond is a 57 y.o. male who had left radical nephrectomy on September 11 who presents with complaints of dizziness, mild chest discomfort and some shortness of breath that he has noticed.  Denies fevers or chills.  Reports possibly some increased pain in his abdomen at incision site.     Physical Exam   Triage Vital Signs: ED Triage Vitals  Encounter Vitals Group     BP 02/10/23 1628 109/63     Systolic BP Percentile --      Diastolic BP Percentile --      Pulse Rate 02/10/23 1628 65     Resp 02/10/23 1628 19     Temp 02/10/23 1628 98 F (36.7 C)     Temp src --      SpO2 02/10/23 1628 100 %     Weight --      Height --      Head Circumference --      Peak Flow --      Pain Score 02/10/23 1627 0     Pain Loc --      Pain Education --      Exclude from Growth Chart --     Most recent vital signs: Vitals:   02/10/23 1628  BP: 109/63  Pulse: 65  Resp: 19  Temp: 98 F (36.7 C)  SpO2: 100%     General: Awake, no distress.  CV:  Good peripheral perfusion.  Resp:  Normal effort.  Abd:  No distention.  Tenderness to the left of the umbilicus, nonsurgical abdomen Other:     ED Results / Procedures / Treatments   Labs (all labs ordered are listed, but only abnormal results are displayed) Labs Reviewed  BASIC METABOLIC PANEL - Abnormal; Notable for the following components:      Result Value   Sodium 134 (*)    Glucose, Bld 155 (*)    BUN 27 (*)    Creatinine, Ser 1.79 (*)    GFR, Estimated 43 (*)    All other components within normal limits  URINALYSIS, ROUTINE W REFLEX MICROSCOPIC - Abnormal; Notable for the following components:   Color, Urine STRAW (*)    APPearance CLEAR (*)    Specific Gravity, Urine 1.033 (*)    Glucose, UA >=500 (*)    All other components  within normal limits  LIPASE, BLOOD - Abnormal; Notable for the following components:   Lipase 78 (*)    All other components within normal limits  SARS CORONAVIRUS 2 BY RT PCR  CBC  CBG MONITORING, ED  TROPONIN I (HIGH SENSITIVITY)     EKG  ED ECG REPORT I, Jene Every, the attending physician, personally viewed and interpreted this ECG.  Date: 02/10/2023  Rhythm: normal sinus rhythm QRS Axis: normal Intervals: normal ST/T Wave abnormalities: normal Narrative Interpretation: no evidence of acute ischemia    RADIOLOGY CT chest and abdomen    PROCEDURES:  Critical Care performed:   Procedures   MEDICATIONS ORDERED IN ED: Medications  sodium chloride 0.9 % bolus 500 mL (0 mLs Intravenous Stopped 02/10/23 1831)  iohexol (OMNIPAQUE) 350 MG/ML injection 75 mL (75 mLs Intravenous Contrast Given 02/10/23 1748)     IMPRESSION / MDM / ASSESSMENT AND PLAN / ED COURSE  I  reviewed the triage vital signs and the nursing notes. Patient's presentation is most consistent with acute presentation with potential threat to life or bodily function.   Patient presents with dizziness, mild chest discomfort and shortness of breath status post radical nephrectomy 11 days ago.  He also has possibly some increased abdominal pain  Differential includes PE, pneumonia, postoperative bleeding/anemia/infection  Lab work overall reassuring, will give IV fluids and obtain CT angiography of the chest as well as CT of the abdomen pelvis  CT scans are reassuring, no evidence of PE, troponin, lipase unremarkable  Patient feeling well here in the emergency department, encouraged increased p.o. hydration, close follow-up with PCP.     FINAL CLINICAL IMPRESSION(S) / ED DIAGNOSES   Final diagnoses:  Dizziness     Rx / DC Orders   ED Discharge Orders     None        Note:  This document was prepared using Dragon voice recognition software and may include unintentional dictation  errors.   Jene Every, MD 02/10/23 1901

## 2023-02-10 NOTE — ED Triage Notes (Signed)
Pt comes with c/o dizziness that started week ago. Pt states he did have kidney removed on 9-11. Pt states little sob and chest pain. Pt states some lower left sided pain also where his scar is. Pt states he has had weakness and just no energy.

## 2023-03-03 IMAGING — CR DG CHEST 2V
1 series · 2 of 2 positions shown · non-contrast
Comparison: Chest radiograph 12/27/2016.

CLINICAL DATA: epigastric pain

EXAM:
CHEST - 2 VIEW

[Series 1: dg chest 2 view · 0.14mm/px · 2 of 2 slices shown]
[im 1/2]
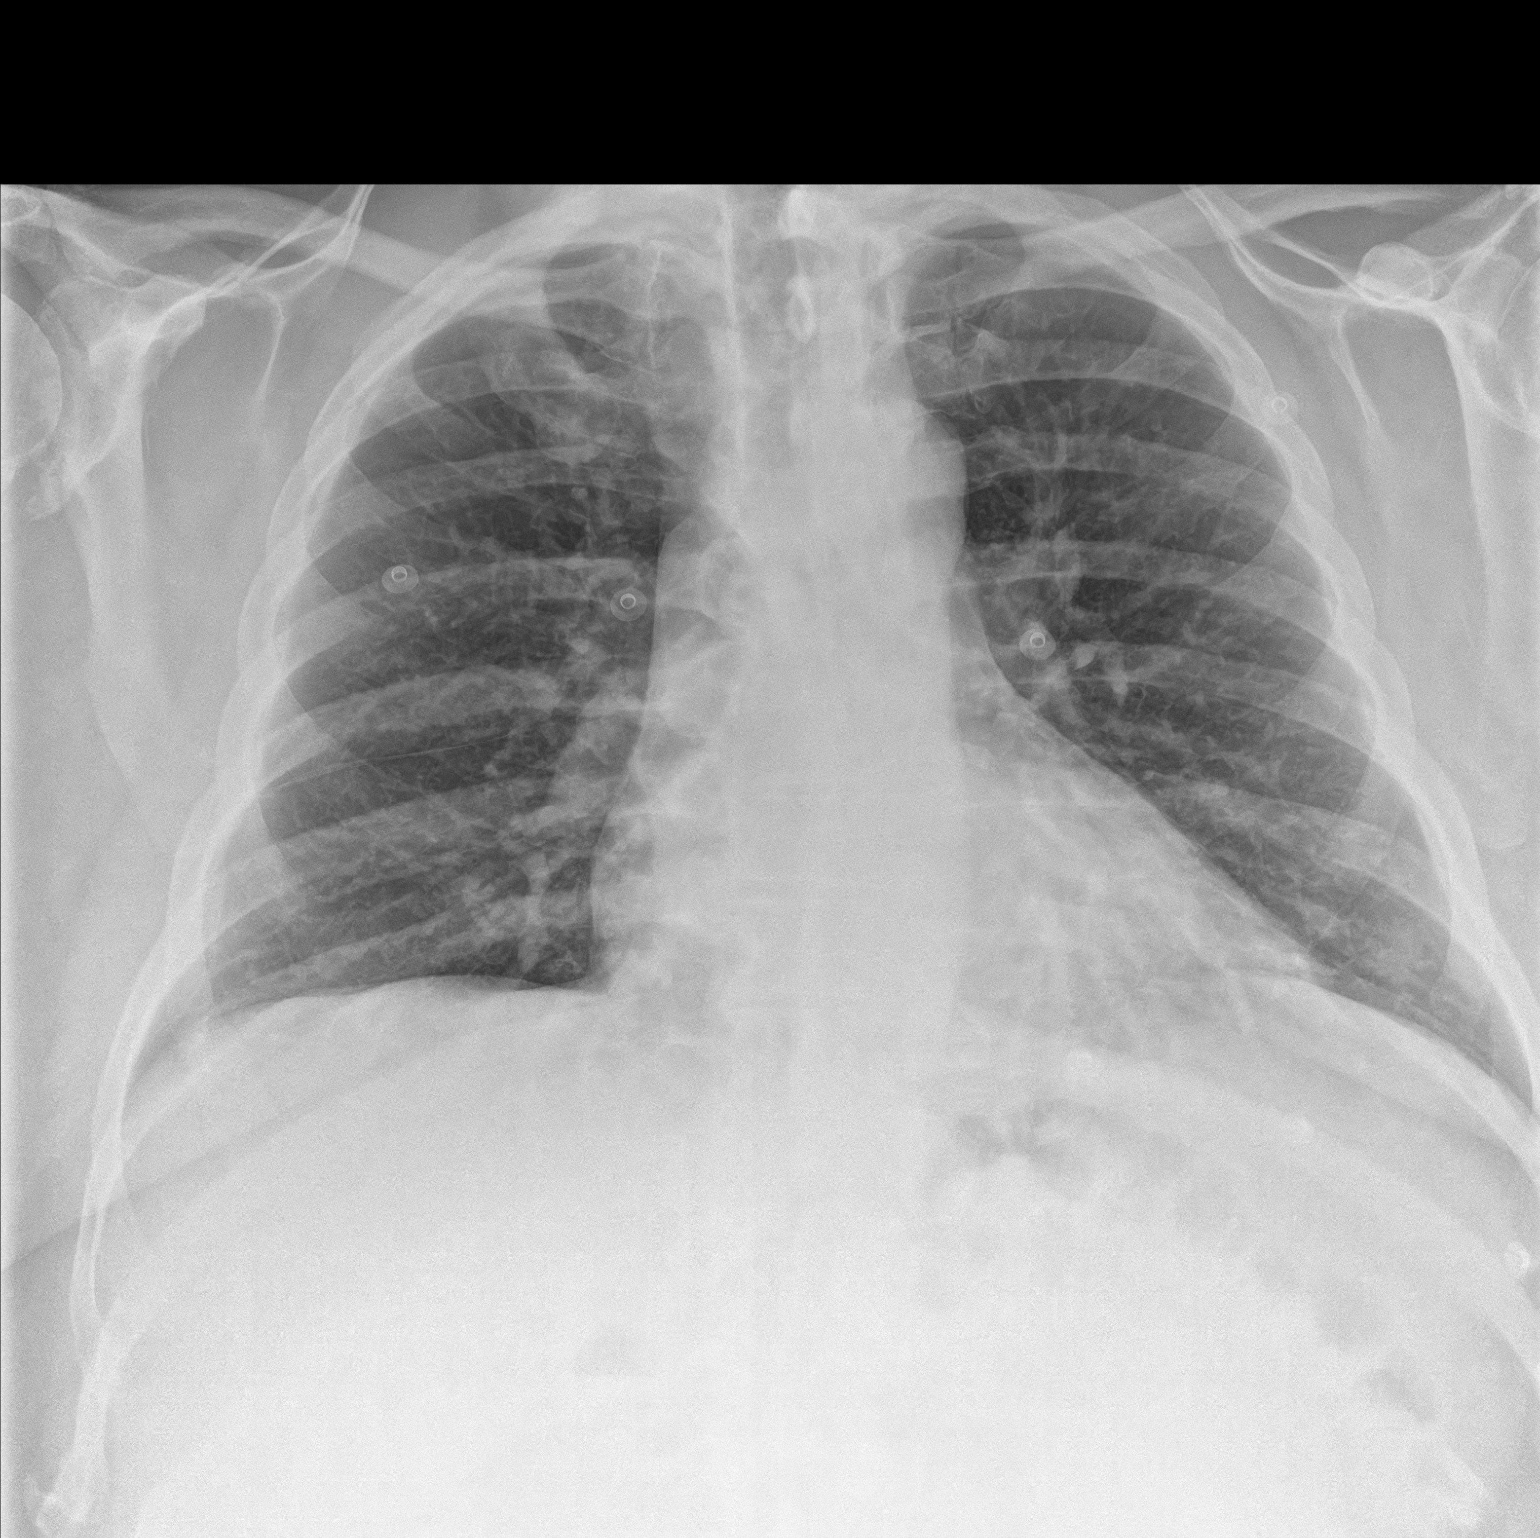
[im 2/2]
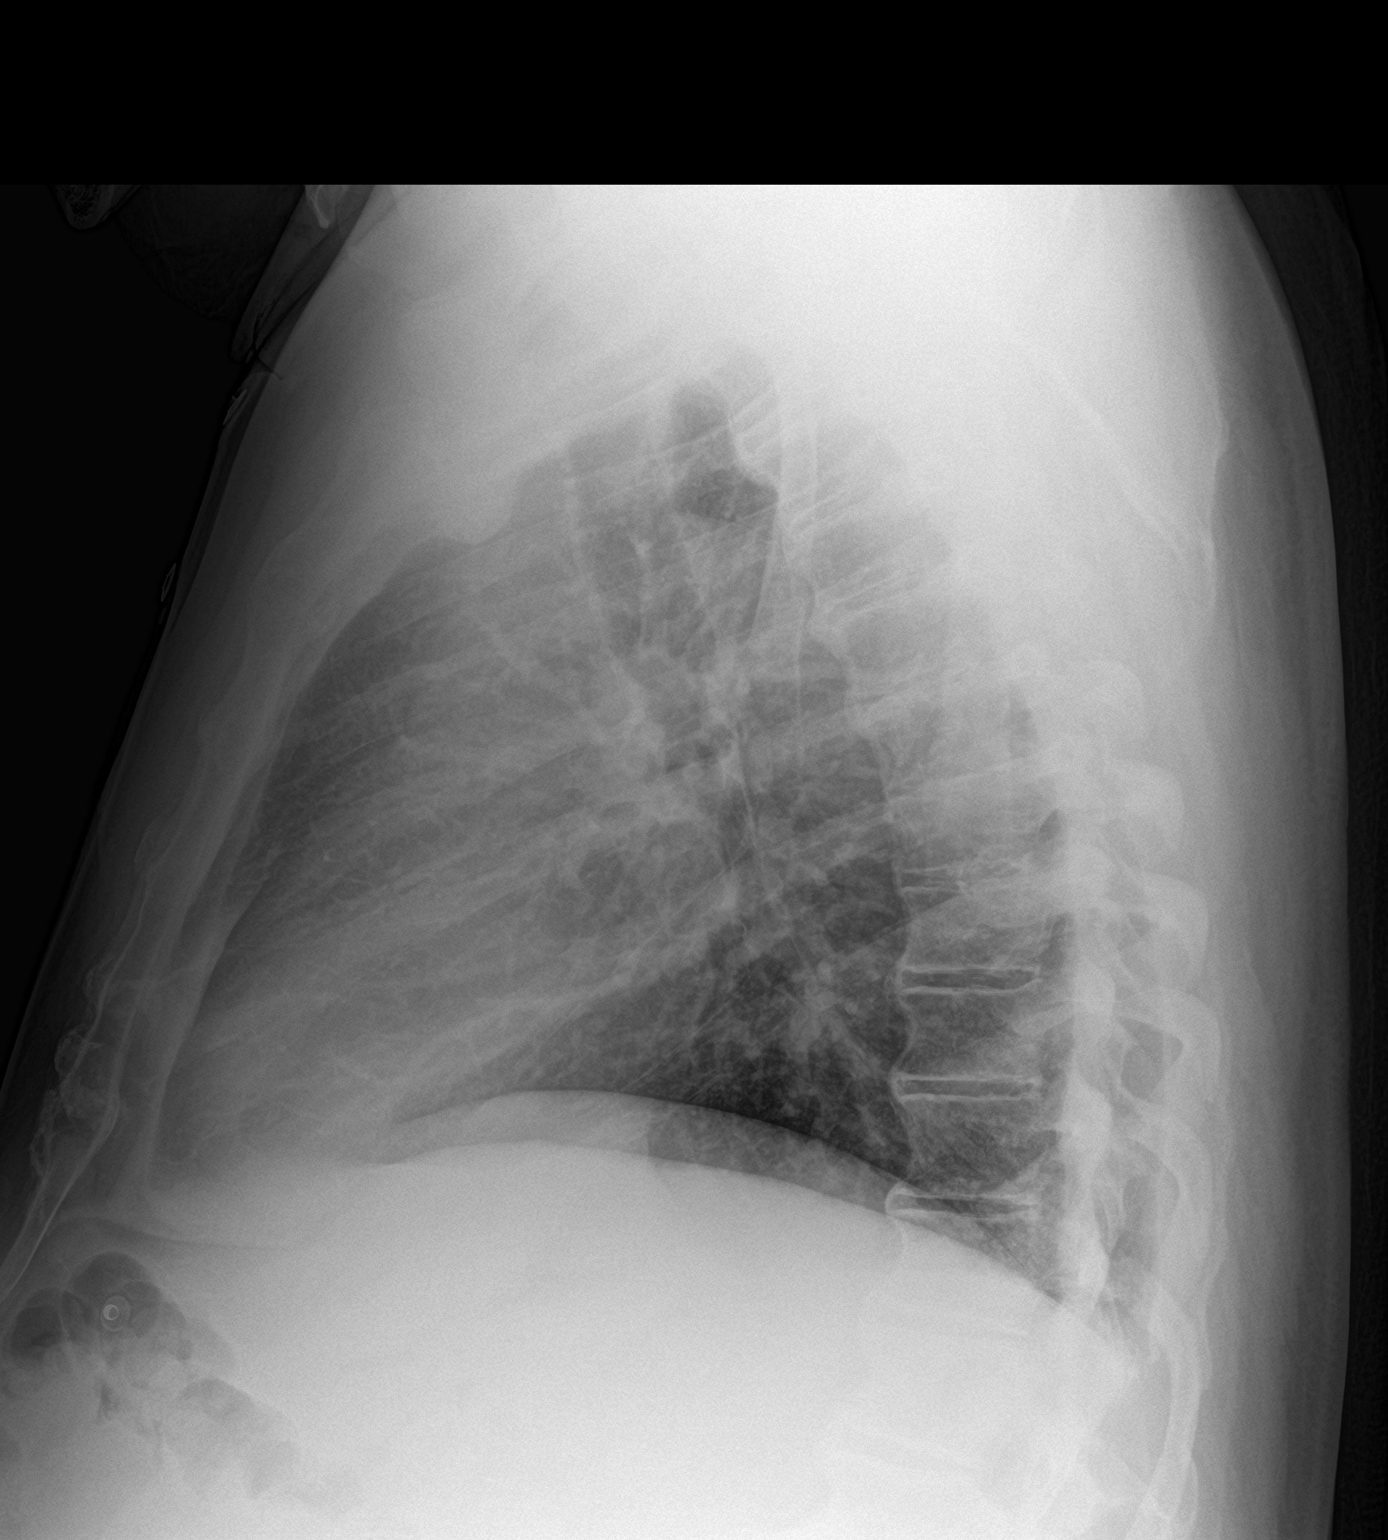

[2 of 2 positions shown; findings below may reference images not displayed]

FINDINGS: No consolidation. No visible pleural effusions or pneumothorax.
Cardiomediastinal silhouette is within normal limits. No displaced
fracture. Degenerative changes of the visualized spine.
IMPRESSION: No evidence of acute cardiopulmonary disease.

## 2023-03-03 IMAGING — US US ABDOMEN LIMITED
1 series · 14 of 25 positions shown · non-contrast
Comparison: Report from abdominal ultrasound

CLINICAL DATA: Provided history: Pain, indigestion for 2 days.

EXAM:
ULTRASOUND ABDOMEN LIMITED RIGHT UPPER QUADRANT

[Series 1: us abdomen limited ruq (liver/gb) · 14 of 39 slices shown]
[im 1/39]
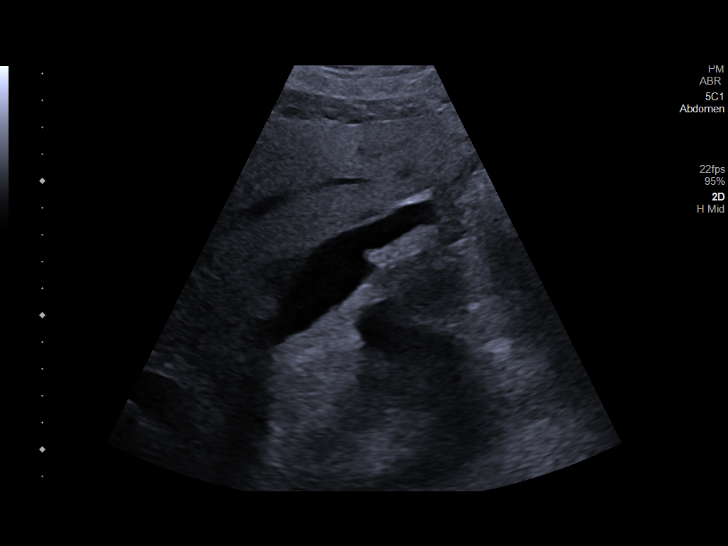
[im 4/39]
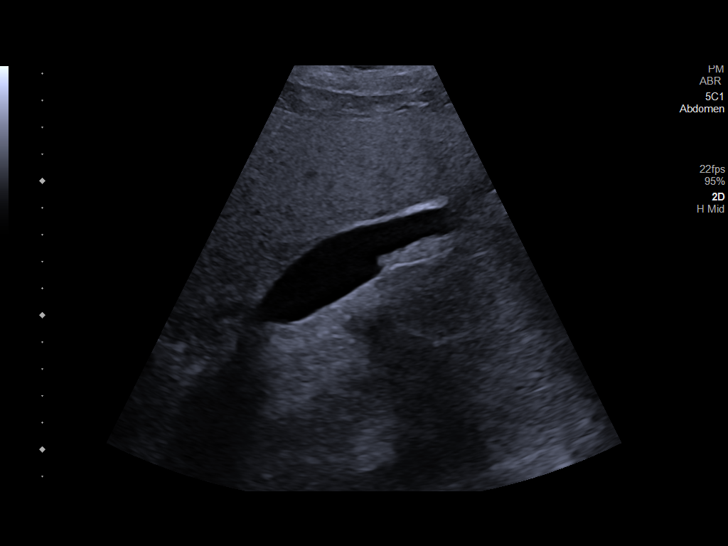
[im 7/39]
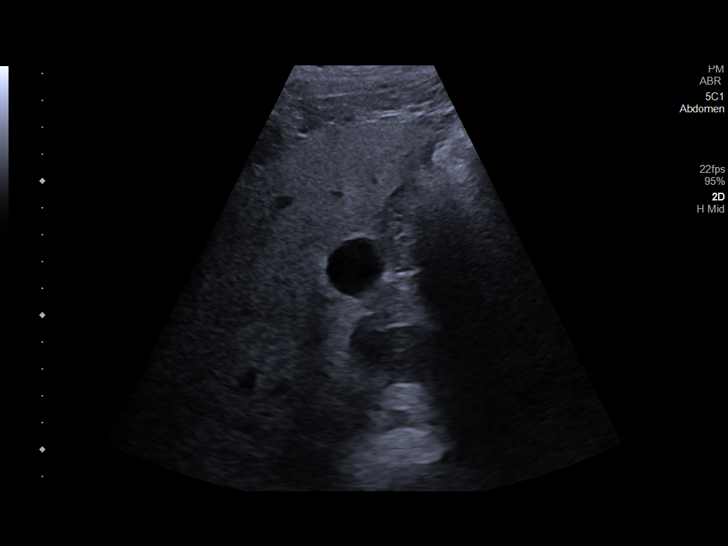
[im 10/39]
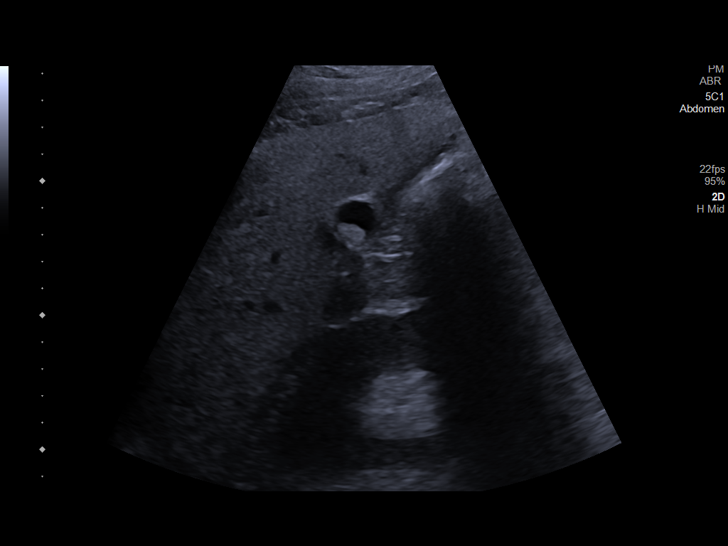
[im 13/39]
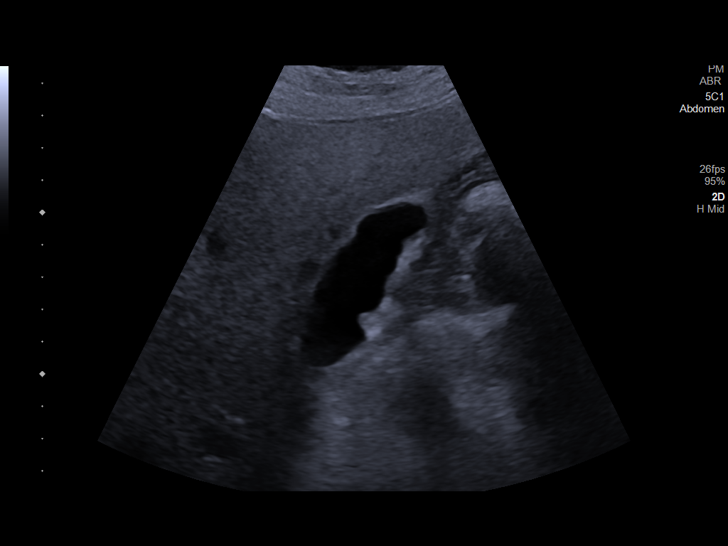
[im 15/39]
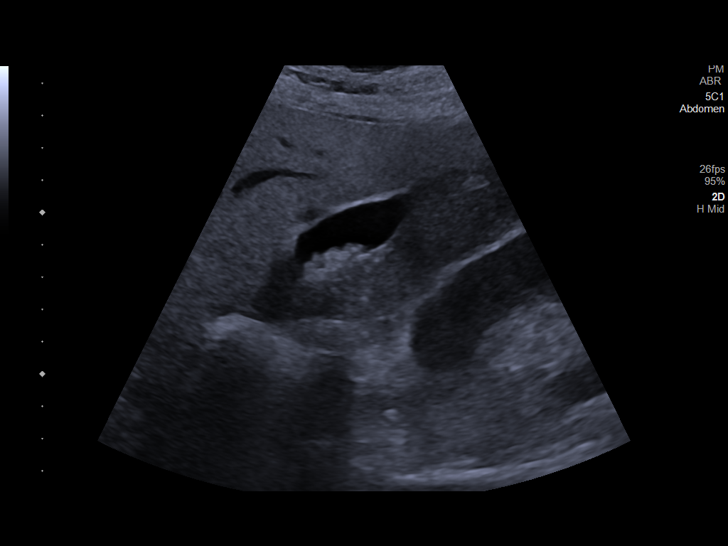
[im 18/39]
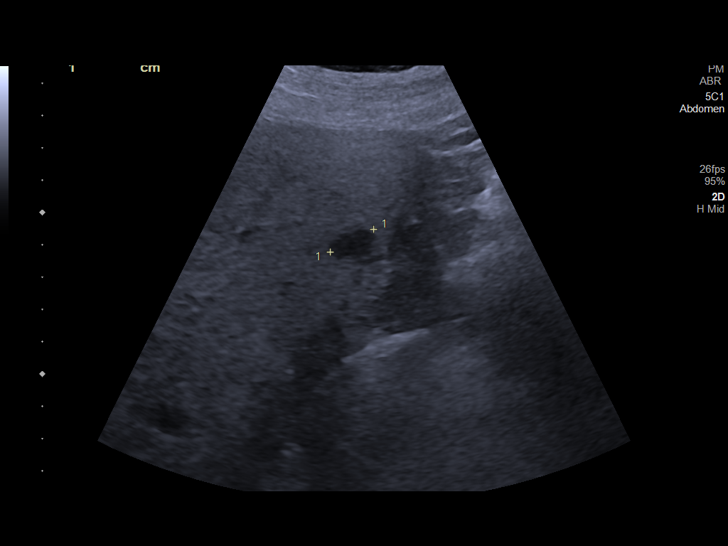
[im 21/39]
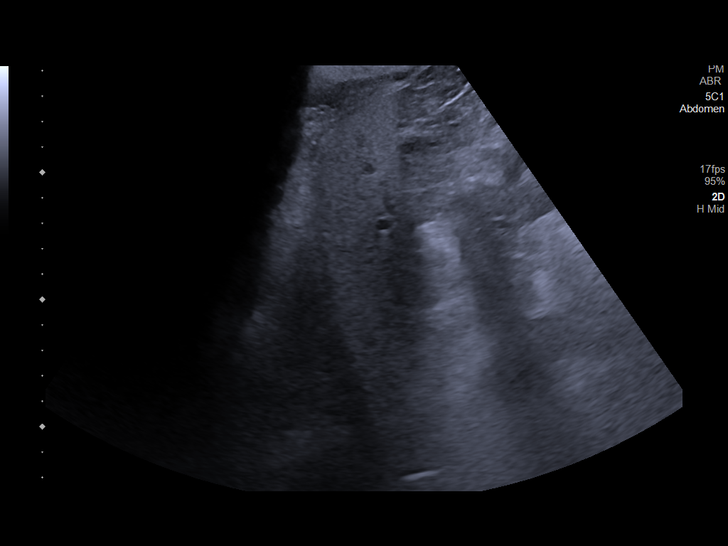
[im 24/39]
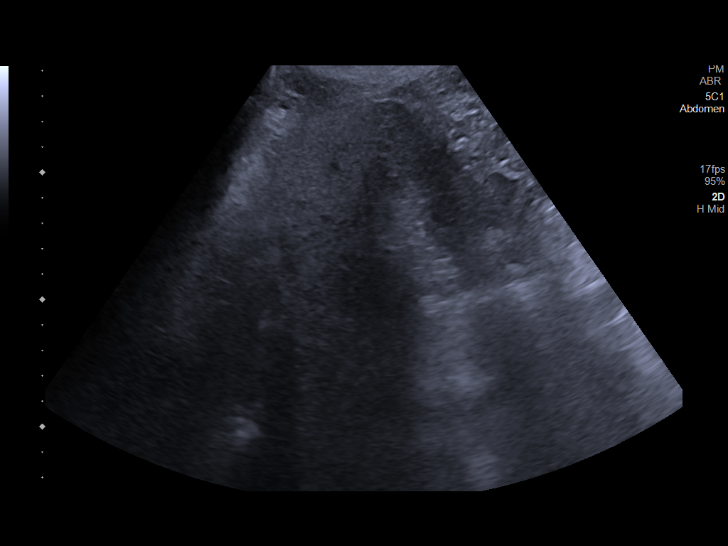
[im 26/39]
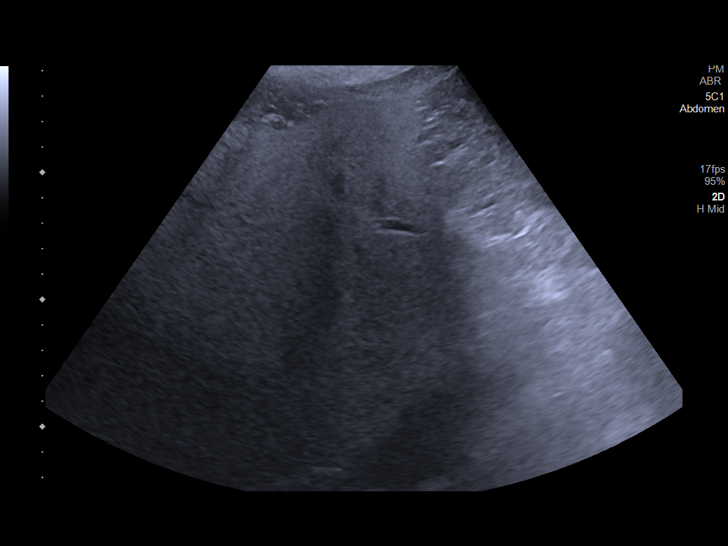
[im 29/39]
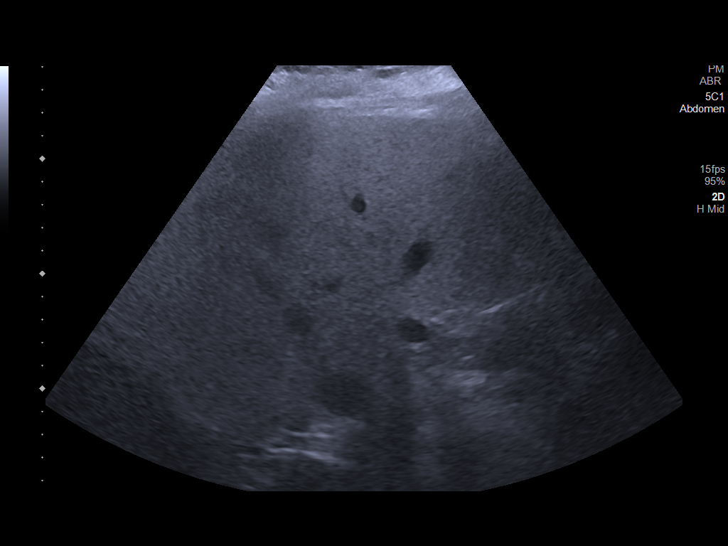
[im 32/39]
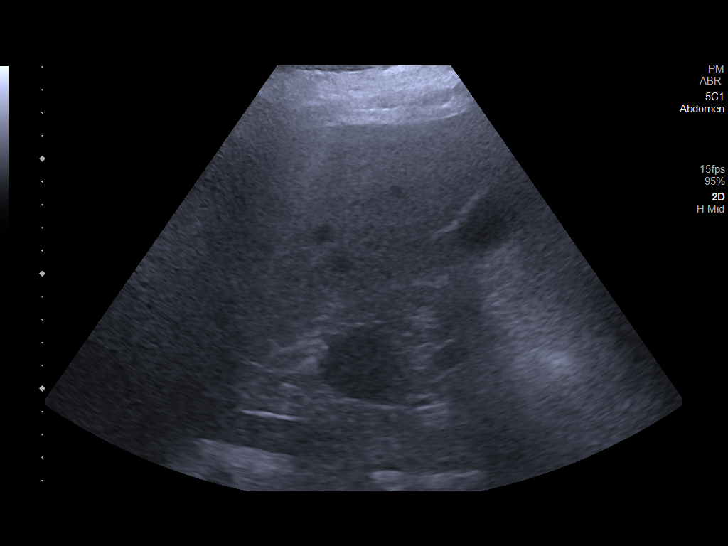
[im 35/39]
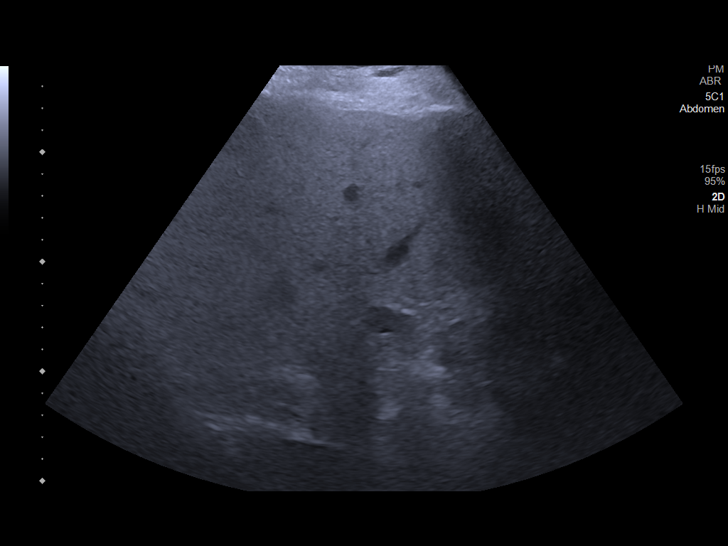
[im 39/39]
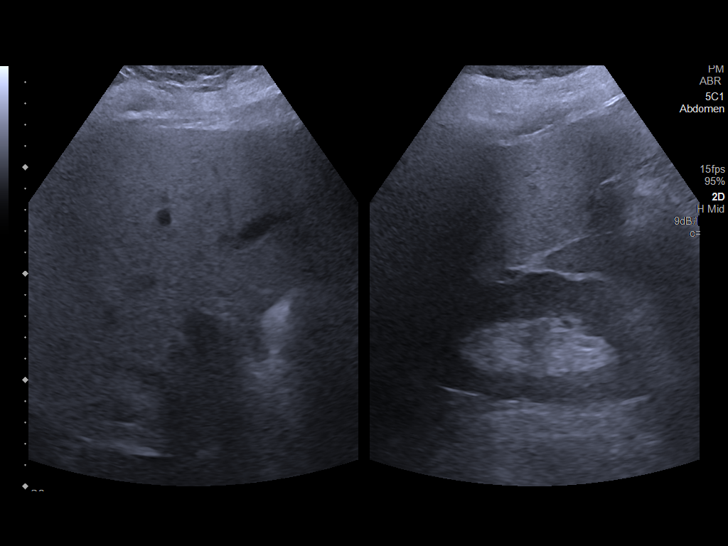

[14 of 25 positions shown; findings below may reference images not displayed]

FINDINGS: Gallbladder:

Cholecystolithiasis. No gallbladder wall thickening or appreciable
pericholecystic fluid. No sonographic Murphy sign is elicited by the
scanning technologist.

Common bile duct:

Diameter: 4 mm, within normal limits.

Liver:

Increased hepatic parenchymal echogenicity. 1.7 x 1.2 x 1.5 cm focus
of relative hypoechogenicity along the gallbladder fossa, likely
reflecting focal fatty sparing. Portal vein is patent on color
Doppler imaging with normal direction of blood flow towards the
liver.
IMPRESSION: Cholecystolithiasis without sonographic evidence of acute
cholecystitis.

Hyperechogenicity of the hepatic parenchyma. Although nonspecific,
this finding is most often secondary to hepatic steatosis.

1.7 x 1.5 cm focus of relative hypoechogenicity along the
gallbladder fossa, likely reflecting focal fatty sparing.

## 2023-03-18 ENCOUNTER — Other Ambulatory Visit: Payer: BC Managed Care – PPO

## 2023-03-18 DIAGNOSIS — N138 Other obstructive and reflux uropathy: Secondary | ICD-10-CM

## 2023-03-18 LAB — URINALYSIS, COMPLETE
Bilirubin, UA: NEGATIVE
Ketones, UA: NEGATIVE
Leukocytes,UA: NEGATIVE
Nitrite, UA: NEGATIVE
RBC, UA: NEGATIVE
Specific Gravity, UA: 1.015 (ref 1.005–1.030)
Urobilinogen, Ur: 0.2 mg/dL (ref 0.2–1.0)
pH, UA: 5.5 (ref 5.0–7.5)

## 2023-03-18 LAB — MICROSCOPIC EXAMINATION

## 2023-03-21 ENCOUNTER — Encounter
Admission: RE | Admit: 2023-03-21 | Discharge: 2023-03-21 | Disposition: A | Discharge status: Home / Self Care | Payer: BC Managed Care – PPO | Source: Ambulatory Visit | Attending: Urology | Admitting: Urology

## 2023-03-21 ENCOUNTER — Other Ambulatory Visit: Payer: Self-pay

## 2023-03-21 VITALS — Ht 68.0 in | Wt 290.0 lb

## 2023-03-21 DIAGNOSIS — Z029 Encounter for administrative examinations, unspecified: Secondary | ICD-10-CM

## 2023-03-21 DIAGNOSIS — Z01812 Encounter for preprocedural laboratory examination: Secondary | ICD-10-CM

## 2023-03-21 HISTORY — DX: Essential (primary) hypertension: I10

## 2023-03-21 HISTORY — DX: Morbid (severe) obesity due to excess calories: E66.01

## 2023-03-21 HISTORY — DX: Type 2 diabetes mellitus with other specified complication: E11.69

## 2023-03-21 HISTORY — DX: Atherosclerotic heart disease of native coronary artery without angina pectoris: I25.10

## 2023-03-21 HISTORY — DX: Benign prostatic hyperplasia without lower urinary tract symptoms: N40.0

## 2023-03-21 HISTORY — DX: Hypo-osmolality and hyponatremia: E87.1

## 2023-03-21 LAB — CULTURE, URINE COMPREHENSIVE

## 2023-03-21 NOTE — Patient Instructions (Addendum)
Your procedure is scheduled on: Friday, November 8 Report to the Registration Desk on the 1st floor of the [facility] To find out your arrival time, please call [phone] between 1PM - 3PM on: Thursday, November 7 If your arrival time is 6:00 am, do not arrive before that time as the [facility] entrance doors do not open until 6:00 am.  REMEMBER: Instructions that are not followed completely may result in serious medical risk, up to and including death; or upon the discretion of your surgeon and anesthesiologist your surgery may need to be rescheduled.  Do not eat or drink after midnight the night before surgery.  No gum chewing or hard candies.  One week prior to surgery: starting November 1 Stop Anti-inflammatories (NSAIDS) such as Advil, Aleve, Ibuprofen, Motrin, Naproxen, Naprosyn and Aspirin based products such as Excedrin, Goody's Powder, BC Powder. Stop ANY OVER THE COUNTER supplements until after surgery.  You may however, continue to take Tylenol if needed for pain up until the day of surgery.  Ozempic - hold for 7 days before surgery. Do NOT take your Ozempic on Saturday, November 2. Resume AFTER surgery on your regular weekly day, Saturday, November 9.  Aspirin - hold for 5 days before surgery. Last day to take is Saturday, November 2. Resume AFTER surgery per surgeon instruction.  Jardiance - hold for 3 days before surgery. Last day to take Jardiance is Monday, November 4. Resume AFTER surgery.  Metformin - hold for 2 days before surgery. Last day to take Metformin is Tuesday, November 5. Resume AFTER surgery.  Continue taking all of your other prescription medications up until the day of surgery.  ON THE DAY OF SURGERY ONLY TAKE THESE MEDICATIONS WITH SIPS OF WATER:  Atorvastatin Carvedilol Gabapentin Omeprazole (Prilosec) Tamsulosin (Flomax) Dutasteride  No Alcohol for 24 hours before or after surgery.  No Smoking including e-cigarettes for 24 hours  before surgery.  No chewable tobacco products for at least 6 hours before surgery.  No nicotine patches on the day of surgery.  Do not use any "recreational" drugs for at least a week (preferably 2 weeks) before your surgery.  Please be advised that the combination of cocaine and anesthesia may have negative outcomes, up to and including death. If you test positive for cocaine, your surgery will be cancelled.  On the morning of surgery brush your teeth with toothpaste and water, you may rinse your mouth with mouthwash if you wish. Do not swallow any toothpaste or mouthwash.  Do not wear jewelry.  For welded (permanent) jewelry: bracelets, anklets, waist bands, etc.  Please have this removed prior to surgery.  If it is not removed, there is a chance that hospital personnel will need to cut it off on the day of surgery.  Do not wear lotions, powders, or perfumes.   Do not shave body hair from the neck down 48 hours before surgery.  Contact lenses, hearing aids and dentures may not be worn into surgery.  Do not bring valuables to the hospital. [facility] is not responsible for any missing/lost belongings or valuables.   Bring your C-PAP to the hospital in case you may have to spend the night.   Notify your doctor if there is any change in your medical condition (cold, fever, infection).  Wear comfortable clothing (specific to your surgery type) to the hospital.  After surgery, you can help prevent lung complications by doing breathing exercises.  Take deep breaths and cough every 1-2 hours.  If you are being discharged the day of surgery, you will not be allowed to drive home. You will need a responsible individual to drive you home and stay with you for 24 hours after surgery.   If you are taking public transportation, you will need to have a responsible individual with you.  Please call the Pre-admissions Testing Dept. at [phone] if you have any questions about these  instructions.  Surgery Visitation Policy:  Patients having surgery or a procedure may have two visitors.  Children under the age of 11 must have an adult with them who is not the patient.

## 2023-03-27 ENCOUNTER — Encounter: Payer: Self-pay | Admitting: Urology

## 2023-03-27 NOTE — Progress Notes (Signed)
Perioperative / Anesthesia Services  Pre-Admission Testing Clinical Review / Pre-Operative Anesthesia Consult  Date: 03/28/23  Patient Demographics:  Name: Justin Hammond DOB:   1966/02/15 MRN:   [id number]  Planned Surgical Procedure(s):    Case: [id number] Date/Time: 03/29/23 0715   Procedure: HOLEP-LASER ENUCLEATION OF THE PROSTATE WITH MORCELLATION   Anesthesia type: General   Pre-op diagnosis: Benign Prostatic Hyperplasia with Urinary Obstruction   Location: [facility] OR ROOM 10 / [facility]   Surgeons: Sondra Come, MD     NOTE: Available PAT nursing documentation and vital signs have been reviewed. Clinical nursing staff has updated patient's PMH/PSHx, current medication list, and drug allergies/intolerances to ensure comprehensive history available to assist in medical decision making as it pertains to the aforementioned surgical procedure and anticipated anesthetic course. Extensive review of available clinical information personally performed. Justin Hammond PMH and PSHx updated with any diagnoses/procedures that  may have been inadvertently omitted during his intake with the pre-admission testing department's nursing staff.  Clinical Discussion:  Justin Hammond is a 57 y.o. male who is submitted for pre-surgical anesthesia review and clearance prior to him undergoing the above procedure. Patient has never been a smoker. Pertinent PMH includes: CAD, NSTEMI, HTN, HLD, T2DM, OSAH (requires nocturnal PAP therapy), GERD (on daily PPI), renal cell carcinoma, BPH, OA, peripheral neuropathy.  Patient is followed by cardiology Gwen Pounds, MD). He was last seen in the cardiology clinic on 01/18/2023; notes reviewed. At the time of his clinic visit, patient doing well overall from a cardiovascular perspective. Patient denied any chest pain, shortness of breath, PND, orthopnea, palpitations, significant peripheral edema, weakness, fatigue, vertiginous symptoms, or  presyncope/syncope. Patient with a past medical history significant for cardiovascular diagnoses. Documented physical exam was grossly benign, providing no evidence of acute exacerbation and/or decompensation of the patient's known cardiovascular conditions.  Patient suffered an NSTEMI on 12/27/2016.  Troponins were trended: 0.21 --> 0.13 --> 0.21 --> 0.21 ng/mL.  Patient underwent diagnostic LEFT heart catheterization on 12/29/2022 revealing a total occlusion of the mid RCA with faint collaterals from the left.  Additionally, he had a 99% ostial to proximal LAD lesion.  Patient subsequently underwent PCI placing a 4.50 x 15 mm Resolute Onyx DES x 1 to the ostial to proximal LAD.   TTE performed on 12/24/2019 revealed a normal left ventricular systolic function with an EF of 60 to 65%.  There were no regional wall motion abnormalities.  Diastolic Doppler parameters were normal.  Right ventricular size and function normal with pulmonary artery systolic pressure was within normal limits. All transvalvular gradients were noted to be normal providing no evidence suggestive of valvular stenosis. Aorta normal in size with no evidence of aneurysmal dilatation.  Most recent myocardial perfusion imaging study was performed on 01/22/2023 revealing a normal left ventricular systolic function with an EF of >65%.  There was no evidence of stress-induced myocardial ischemia or arrhythmia; no scintigraphic evidence of scar.  CT attenuation correction images showed coronary stent located in the proximal LAD.  Overall, study was determined to be low risk.  Blood pressure well controlled at 128/76 mmHg on currently prescribed beta-blocker (carvedilol) and ARB (losartan) therapies.  Patient is on atorvastatin for his HLD diagnosis and ASCVD prevention. T2DM suboptimally controlled on currently prescribed regimen; last HgbA1c was 7.9% when checked on 12/06/2022.  In the setting of known cardiovascular diagnoses and concurrent  T2DM, patient is on an SGLT2i (empagliflozin) for added cardiovascular and renovascular protection.  He does  have an OSAH diagnosis and is reported to be compliant with prescribed nocturnal PAP therapy. Patient is able to complete all of his  ADL/IADLs without cardiovascular limitation.  Per the [facility], patient is able to achieve at least 4 METS of physical activity without experiencing any significant degree of angina/anginal equivalent symptoms. No changes were made to his medication regimen during his visit with cardiology.  Patient scheduled to follow-up with outpatient cardiology in 9 months or sooner if needed.  Justin Hammond is scheduled for a HOLEP-LASER ENUCLEATION OF THE PROSTATE WITH MORCELLATION on 03/29/2023 with Dr. Legrand Rams, MD.  Given patient's past medical history significant for cardiovascular diagnoses, presurgical cardiac clearance was sought by the PAT team.  Per cardiology, "this patient is optimized for surgery and may proceed with the planned procedural course with a MODERATE risk of significant perioperative cardiovascular complications".  In review of his medication reconciliation, it is noted that patient is currently on prescribed daily antithrombotic therapy.  Patient has been instructed on recommendations from cardiology for holding his daily low-dose ASA for 5 days prior to his procedure with plans to restart since postoperatively respectively minimized by primary attending surgeon.  The patient is aware that his last dose of aspirin should be on 03/23/2023.    Patient denies previous perioperative complications with anesthesia in the past. In review of the available records, it is noted that patient underwent a general anesthetic course at [facility]  (ASA III) in 01/2023 without documented complications.      03/21/2023   11:26 AM 02/10/2023    7:00 PM 02/10/2023    4:28 PM  Vitals with BMI  Height 5\' 8"     Weight 290 lbs    BMI 44.1    Systolic  118  109  Diastolic  68 63  Pulse  64 65    Providers/Specialists:   NOTE: Primary physician provider listed below. Patient may have been seen by APP or partner within same practice.   PROVIDER ROLE / SPECIALTY LAST Lise Auer, MD Urology (Surgeon) 12/24/2022  Jaclyn Shaggy, MD Primary Care Provider ???  Arnoldo Hooker, MD Cardiology 01/18/2023  Louretta Shorten, MD Medical Oncology 12/31/2022   Allergies:  Penicillins  Current Home Medications:   No current facility-administered medications for this encounter.    aspirin EC 81 MG tablet   atorvastatin (LIPITOR) 40 MG tablet   carvedilol (COREG) 3.125 MG tablet   docusate sodium (COLACE) 100 MG capsule   dutasteride (AVODART) 0.5 MG capsule   gabapentin (NEURONTIN) 400 MG capsule   glimepiride (AMARYL) 4 MG tablet   HYDROcodone-acetaminophen (NORCO) 5-325 MG tablet   JARDIANCE 25 MG TABS tablet   losartan (COZAAR) 100 MG tablet   metFORMIN (GLUCOPHAGE) 500 MG tablet   NON FORMULARY   omeprazole (PRILOSEC) 40 MG capsule   Semaglutide, 1 MG/DOSE, (OZEMPIC, 1 MG/DOSE,) 4 MG/3ML SOPN   tamsulosin (FLOMAX) 0.4 MG CAPS capsule   History:   Past Medical History:  Diagnosis Date   Arthritis    Bladder cancer (HCC) 2007   BPH (benign prostatic hyperplasia)    Cholelithiasis    Coronary artery disease 12/28/2016   a.) LHC/PCI 12/28/2016: 100% mRCA with faint collaterals from LEFT, 99% o-pLAD (4.50 x 15 mm Resolute Onyx DES); b.) MV 12/29/2019: no ischemia; c.) MV 01/22/2023: no ischemia   Essential hypertension    GERD (gastroesophageal reflux disease)    History of 2019 novel coronavirus disease (COVID-19) 05/27/2019   History of  echocardiogram 12/24/2019   a.) TTE 12/24/2019: EF 60-65%, no RWMAs, normal RVSF, no valvulopathies   Hyperlipidemia    Long-term use of aspirin therapy    Morbid obesity with BMI of 40.0-44.9, adult (HCC)    Neuropathy of both feet    NSTEMI (non-ST elevated myocardial infarction)  (HCC) 12/27/2016   a.) troponins trended: 0.21 --> 0.13 --> 0.21 --> 0.21 ng/mL; b.) LHC 12/28/2016: 99% o-pLAD (4.5 x 15 mm Resolute Onyx DES)   OSA on CPAP    Proteinuria    Renal cell carcinoma, left (HCC) 01/2023   a.) s/p LEFT nephrectomy, adrenalectomy, and retroperitoneal lymph node dissection 01/2023   Type 2 diabetes mellitus with hyperlipidemia [facility])    Past Surgical History:  Procedure Laterality Date   APPENDECTOMY  1978   CORONARY STENT INTERVENTION N/A 12/28/2016   Procedure: CORONARY STENT INTERVENTION;  Surgeon: Dalia Heading, MD;  Location: [facility] INVASIVE CV LAB;  Service: Cardiovascular;  Laterality: N/A;   CORONARY STENT INTERVENTION N/A 12/28/2016   Procedure: CORONARY STENT INTERVENTION;  Surgeon: Marcina Millard, MD;  Location: [facility] INVASIVE CV LAB;  Service: Cardiovascular;  Laterality: N/A;   LEFT HEART CATH AND CORONARY ANGIOGRAPHY N/A 12/28/2016   Procedure: LEFT HEART CATH AND CORONARY ANGIOGRAPHY;  Surgeon: Dalia Heading, MD;  Location: [facility] INVASIVE CV LAB;  Service: Cardiovascular;  Laterality: N/A;   ROBOT ASSISTED LAPAROSCOPIC NEPHRECTOMY Left 01/30/2023   Procedure: XI ROBOTIC ASSISTED LEFT LAPAROSCOPIC RADICAL NEPHRECTOMY AND RETROPERITONEAL NODE DISSECTION;  Surgeon: Loletta Parish., MD;  Location: [facility] ORS;  Service: Urology;  Laterality: Left;  180 MINUTES   TRANSURETHRAL MICROWAVE THERAPY     2009, 2018   Family History  Problem Relation Age of Onset   Breast cancer Mother    CAD Father    Social History   Tobacco Use   Smoking status: Never   Smokeless tobacco: Never  Vaping Use   Vaping status: Never Used  Substance Use Topics   Alcohol use: No   Drug use: Never    Pertinent Clinical Results:  LABS:   Lab Results  Component Value Date   WBC 8.1 02/10/2023   HGB 14.4 02/10/2023   HCT 45.0 02/10/2023   MCV 84.7 02/10/2023   PLT 292 02/10/2023   Lab Results  Component Value Date   NA 134 (L) 02/10/2023   K 4.5  02/10/2023   CO2 22 02/10/2023   GLUCOSE 155 (H) 02/10/2023   BUN 27 (H) 02/10/2023   CREATININE 1.79 (H) 02/10/2023   CALCIUM 9.1 02/10/2023   GFRNONAA 43 (L) 02/10/2023   Lab Results  Component Value Date   HGBA1C 7.9 (H) 12/06/2022    ECG: Date: 02/10/2023 Time ECG obtained: 1633 PM Rate: 65 bpm Rhythm: normal sinus Axis (leads I and aVF): Normal Intervals: PR 204 ms. QRS 96 ms. QTc 418 ms. ST segment and T wave changes: No evidence of acute ST segment elevation or depression.   Comparison: Similar to previous tracing obtained on 12/06/2022   IMAGING / PROCEDURES: CT ANGIO CHEST, ABDOMEN, PELVIS W CONTRAST performed on 02/10/2023 Negative for acute pulmonary embolus or aortic dissection. Interval left nephrectomy and adrenalectomy. Small focus of gas and fluid in the left retroperitoneum near the left adrenal bed with a few other scattered foci of left retroperitoneal gas, favor postoperative gas and probable postoperative fluid collection. No strong rim enhancement to suggest organized abscess at this time. Minimal fat stranding near the pancreatic tail, probably resolving postsurgical change but suggest  correlation with enzymes to exclude pancreatic inflammation. Gallstones.  MYOCARDIAL PERFUSION IMAGING STUDY (LEXISCAN) performed on 01/22/2023 Normal left ventricular systolic function with a normal LVEF of >65% % Normal myocardial thickening and wall motion Left ventricular cavity size normal Coronary stent noted in the proximal LAD SPECT images demonstrate homogenous tracer distribution throughout the myocardium No evidence of stress-induced myocardial ischemia or arrhythmia Normal low risk study  TRANSTHORACIC ECHOCARDIOGRAM performed on 12/24/2019 Left ventricular ejection fraction, by estimation, is 60 to 65%. The left ventricle has normal function. The left ventricle has no regional wall motion abnormalities. Left ventricular diastolic parameters were normal.   Right ventricular systolic function is normal. The right ventricular size is normal. There is normal pulmonary artery systolic pressure.  The mitral valve is normal in structure. No evidence of mitral valve regurgitation.  The aortic valve is grossly normal. Aortic valve regurgitation is not visualized.   LEFT HEART CATHETERIZATION AND CORONARY ANGIOGRAPHY performed on 12/29/2022 Low normal left ventricular systolic function with an EF of 50-55% LVEDP mildly elevated at 15-18 mmHg Mild inferior hypokinesis Trivial mitral valve regurgitation Trivial aortic valve regurgitation Multivessel CAD 99% ostial LAD 100% mid RCA with faint LEFT collaterals Successful PCI 4.5 x 15 mm Resolute Onyx DES to the proximal LAD    Impression and Plan:  Justin Hammond has been referred for pre-anesthesia review and clearance prior to him undergoing the planned anesthetic and procedural courses. Available labs, pertinent testing, and imaging results were personally reviewed by me in preparation for upcoming operative/procedural course. Justin Hammond medical record has been updated following extensive record review and patient interview with PAT staff.   This patient has been appropriately cleared by cardiology with an overall MODERATE risk of experiencing significant perioperative cardiovascular complications. Based on clinical review performed today (03/28/23), barring any significant acute changes in the patient's overall condition, it is anticipated that he will be able to proceed with the planned surgical intervention. Any acute changes in clinical condition may necessitate his procedure being postponed and/or cancelled. Patient will meet with anesthesia team (MD and/or CRNA) on the day of his procedure for preoperative evaluation/assessment. Questions regarding anesthetic course will be fielded at that time.   Pre-surgical instructions were reviewed with the patient during his PAT appointment, and  questions were fielded to satisfaction by PAT clinical staff. He has been instructed on which medications that he will need to hold prior to surgery, as well as the ones that have been deemed safe/appropriate to take on the day of his procedure. As part of the general education provided by PAT, patient made aware both verbally and in writing, that he would need to abstain from the use of any illegal substances during his perioperative course.  He was advised that failure to follow the provided instructions could necessitate case cancellation or result in serious perioperative complications up to and including death. Patient encouraged to contact PAT and/or his surgeon's office to discuss any questions or concerns that may arise prior to surgery; verbalized understanding.   Justin Mulling, MSN, APRN, FNP-C, CEN [facility]  Perioperative Services Nurse Practitioner Phone: [phone] Fax: [phone] 03/28/23 7:43 AM  NOTE: This note has been prepared using Dragon dictation software. Despite my best ability to proofread, there is always the potential that unintentional transcriptional errors may still occur from this process.

## 2023-03-28 MED ORDER — SODIUM CHLORIDE 0.9 % IV SOLN: INTRAVENOUS | Status: DC

## 2023-03-28 MED ORDER — CHLORHEXIDINE GLUCONATE 0.12 % MT SOLN
15.0000 mL | Freq: Once | OROMUCOSAL | Status: AC
  Administered 2023-03-29: 15 mL via OROMUCOSAL

## 2023-03-28 MED ORDER — ORAL CARE MOUTH RINSE: 15.0000 mL | Freq: Once | OROMUCOSAL | Status: AC

## 2023-03-28 MED ORDER — CIPROFLOXACIN IN D5W 400 MG/200ML IV SOLN: 400.0000 mg | INTRAVENOUS | Status: DC

## 2023-03-29 ENCOUNTER — Other Ambulatory Visit: Payer: Self-pay

## 2023-03-29 ENCOUNTER — Ambulatory Visit
Admission: RE | Admit: 2023-03-29 | Discharge: 2023-03-29 | Disposition: A | Discharge status: Home / Self Care | Payer: BC Managed Care – PPO | Attending: Urology | Admitting: Urology

## 2023-03-29 ENCOUNTER — Encounter: Payer: Self-pay | Admitting: Urology

## 2023-03-29 ENCOUNTER — Encounter
Admission: RE | Disposition: A | Discharge status: Home / Self Care | Payer: Self-pay | Source: Home / Self Care | Attending: Urology

## 2023-03-29 ENCOUNTER — Ambulatory Visit: Payer: BC Managed Care – PPO | Admitting: Urgent Care

## 2023-03-29 DIAGNOSIS — Z905 Acquired absence of kidney: Secondary | ICD-10-CM | POA: Diagnosis not present

## 2023-03-29 DIAGNOSIS — Z7985 Long-term (current) use of injectable non-insulin antidiabetic drugs: Secondary | ICD-10-CM | POA: Diagnosis not present

## 2023-03-29 DIAGNOSIS — Z8551 Personal history of malignant neoplasm of bladder: Secondary | ICD-10-CM | POA: Insufficient documentation

## 2023-03-29 DIAGNOSIS — E785 Hyperlipidemia, unspecified: Secondary | ICD-10-CM | POA: Diagnosis not present

## 2023-03-29 DIAGNOSIS — Z6841 Body Mass Index (BMI) 40.0 and over, adult: Secondary | ICD-10-CM | POA: Insufficient documentation

## 2023-03-29 DIAGNOSIS — Z85528 Personal history of other malignant neoplasm of kidney: Secondary | ICD-10-CM | POA: Insufficient documentation

## 2023-03-29 DIAGNOSIS — I2510 Atherosclerotic heart disease of native coronary artery without angina pectoris: Secondary | ICD-10-CM | POA: Insufficient documentation

## 2023-03-29 DIAGNOSIS — K219 Gastro-esophageal reflux disease without esophagitis: Secondary | ICD-10-CM | POA: Insufficient documentation

## 2023-03-29 DIAGNOSIS — N138 Other obstructive and reflux uropathy: Secondary | ICD-10-CM | POA: Insufficient documentation

## 2023-03-29 DIAGNOSIS — N401 Enlarged prostate with lower urinary tract symptoms: Secondary | ICD-10-CM | POA: Insufficient documentation

## 2023-03-29 DIAGNOSIS — E1140 Type 2 diabetes mellitus with diabetic neuropathy, unspecified: Secondary | ICD-10-CM | POA: Insufficient documentation

## 2023-03-29 DIAGNOSIS — Z01812 Encounter for preprocedural laboratory examination: Secondary | ICD-10-CM

## 2023-03-29 DIAGNOSIS — G4733 Obstructive sleep apnea (adult) (pediatric): Secondary | ICD-10-CM | POA: Diagnosis not present

## 2023-03-29 DIAGNOSIS — I252 Old myocardial infarction: Secondary | ICD-10-CM | POA: Insufficient documentation

## 2023-03-29 DIAGNOSIS — I10 Essential (primary) hypertension: Secondary | ICD-10-CM | POA: Insufficient documentation

## 2023-03-29 HISTORY — DX: Unspecified mononeuropathy of bilateral lower limbs: G57.93

## 2023-03-29 HISTORY — PX: HOLEP-LASER ENUCLEATION OF THE PROSTATE WITH MORCELLATION: SHX6641

## 2023-03-29 HISTORY — DX: Long term (current) use of aspirin: Z79.82

## 2023-03-29 HISTORY — DX: Obstructive sleep apnea (adult) (pediatric): G47.33

## 2023-03-29 HISTORY — DX: Calculus of gallbladder without cholecystitis without obstruction: K80.20

## 2023-03-29 LAB — GLUCOSE, CAPILLARY
Glucose-Capillary: 159 mg/dL — ABNORMAL HIGH (ref 70–99)
Glucose-Capillary: 124 mg/dL — ABNORMAL HIGH (ref 70–99)

## 2023-03-29 SURGERY — ENUCLEATION, PROSTATE, USING LASER, WITH MORCELLATION
Anesthesia: General

## 2023-03-29 MED ORDER — FENTANYL CITRATE (PF) 100 MCG/2ML IJ SOLN
INTRAMUSCULAR | Status: AC
  Filled 2023-03-29: qty 2

## 2023-03-29 MED ORDER — FENTANYL CITRATE (PF) 100 MCG/2ML IJ SOLN
INTRAMUSCULAR | Status: DC | PRN
  Administered 2023-03-29: 100 ug via INTRAVENOUS

## 2023-03-29 MED ORDER — ROCURONIUM BROMIDE 100 MG/10ML IV SOLN
INTRAVENOUS | Status: DC | PRN
  Administered 2023-03-29: 20 mg via INTRAVENOUS
  Administered 2023-03-29: 10 mg via INTRAVENOUS

## 2023-03-29 MED ORDER — ONDANSETRON HCL 4 MG/2ML IJ SOLN
INTRAMUSCULAR | Status: DC | PRN
  Administered 2023-03-29: 4 mg via INTRAVENOUS

## 2023-03-29 MED ORDER — SUCCINYLCHOLINE CHLORIDE 200 MG/10ML IV SOSY
PREFILLED_SYRINGE | INTRAVENOUS | Status: AC
Start: 2023-03-29 — End: ?
  Filled 2023-03-29: qty 10

## 2023-03-29 MED ORDER — ACETAMINOPHEN 10 MG/ML IV SOLN: 1000.0000 mg | Freq: Once | INTRAVENOUS | Status: DC | PRN

## 2023-03-29 MED ORDER — OXYCODONE HCL 5 MG PO TABS
ORAL_TABLET | ORAL | Status: AC
  Filled 2023-03-29: qty 1

## 2023-03-29 MED ORDER — SUCCINYLCHOLINE CHLORIDE 200 MG/10ML IV SOSY
PREFILLED_SYRINGE | INTRAVENOUS | Status: DC | PRN
  Administered 2023-03-29: 160 mg via INTRAVENOUS

## 2023-03-29 MED ORDER — SEVOFLURANE IN SOLN
RESPIRATORY_TRACT | Status: AC
  Filled 2023-03-29: qty 250

## 2023-03-29 MED ORDER — LIDOCAINE HCL (CARDIAC) PF 100 MG/5ML IV SOSY
PREFILLED_SYRINGE | INTRAVENOUS | Status: DC | PRN
  Administered 2023-03-29: 100 mg via INTRAVENOUS

## 2023-03-29 MED ORDER — ONDANSETRON HCL 4 MG/2ML IJ SOLN
INTRAMUSCULAR | Status: AC
  Filled 2023-03-29: qty 2

## 2023-03-29 MED ORDER — DEXAMETHASONE SODIUM PHOSPHATE 10 MG/ML IJ SOLN
INTRAMUSCULAR | Status: AC
  Filled 2023-03-29: qty 1

## 2023-03-29 MED ORDER — CIPROFLOXACIN IN D5W 400 MG/200ML IV SOLN
INTRAVENOUS | Status: AC
  Filled 2023-03-29: qty 200

## 2023-03-29 MED ORDER — FENTANYL CITRATE (PF) 100 MCG/2ML IJ SOLN
25.0000 ug | INTRAMUSCULAR | Status: DC | PRN
  Administered 2023-03-29 (×4): 50 ug via INTRAVENOUS

## 2023-03-29 MED ORDER — SUGAMMADEX SODIUM 200 MG/2ML IV SOLN
INTRAVENOUS | Status: DC | PRN
  Administered 2023-03-29: 400 mg via INTRAVENOUS

## 2023-03-29 MED ORDER — OXYCODONE HCL 5 MG/5ML PO SOLN: 5.0000 mg | Freq: Once | ORAL | Status: AC | PRN

## 2023-03-29 MED ORDER — HYDROCODONE-ACETAMINOPHEN 5-325 MG PO TABS: 1.0000 | ORAL_TABLET | Freq: Four times a day (QID) | ORAL | 0 refills | Status: AC | PRN

## 2023-03-29 MED ORDER — SODIUM CHLORIDE 0.9 % IR SOLN
Status: DC | PRN
  Administered 2023-03-29: 15000 mL

## 2023-03-29 MED ORDER — ROCURONIUM BROMIDE 10 MG/ML (PF) SYRINGE
PREFILLED_SYRINGE | INTRAVENOUS | Status: AC
  Filled 2023-03-29: qty 10

## 2023-03-29 MED ORDER — CHLORHEXIDINE GLUCONATE 0.12 % MT SOLN
OROMUCOSAL | Status: AC
  Filled 2023-03-29: qty 15

## 2023-03-29 MED ORDER — CLOTRIMAZOLE-BETAMETHASONE 1-0.05 % EX CREA: 1.0000 | TOPICAL_CREAM | Freq: Two times a day (BID) | CUTANEOUS | 0 refills | Status: AC

## 2023-03-29 MED ORDER — PROPOFOL 10 MG/ML IV BOLUS
INTRAVENOUS | Status: AC
  Filled 2023-03-29: qty 20

## 2023-03-29 MED ORDER — LACTATED RINGERS IV SOLN: INTRAVENOUS | Status: DC | PRN

## 2023-03-29 MED ORDER — MIDAZOLAM HCL 2 MG/2ML IJ SOLN
INTRAMUSCULAR | Status: AC
Start: 2023-03-29 — End: ?
  Filled 2023-03-29: qty 2

## 2023-03-29 MED ORDER — ONDANSETRON HCL 4 MG/2ML IJ SOLN: 4.0000 mg | Freq: Once | INTRAMUSCULAR | Status: DC | PRN

## 2023-03-29 MED ORDER — CEFAZOLIN SODIUM-DEXTROSE 2-4 GM/100ML-% IV SOLN
INTRAVENOUS | Status: AC
  Filled 2023-03-29: qty 100

## 2023-03-29 MED ORDER — DEXTROSE 5 % IV SOLN
INTRAVENOUS | Status: DC | PRN
  Administered 2023-03-29: 3 g via INTRAVENOUS

## 2023-03-29 MED ORDER — PROPOFOL 10 MG/ML IV BOLUS
INTRAVENOUS | Status: DC | PRN
  Administered 2023-03-29: 200 mg via INTRAVENOUS

## 2023-03-29 MED ORDER — DEXAMETHASONE SODIUM PHOSPHATE 10 MG/ML IJ SOLN
INTRAMUSCULAR | Status: DC | PRN
  Administered 2023-03-29: 10 mg via INTRAVENOUS

## 2023-03-29 MED ORDER — MIDAZOLAM HCL 2 MG/2ML IJ SOLN
INTRAMUSCULAR | Status: DC | PRN
  Administered 2023-03-29: 2 mg via INTRAVENOUS

## 2023-03-29 MED ORDER — LIDOCAINE HCL (PF) 2 % IJ SOLN
INTRAMUSCULAR | Status: AC
  Filled 2023-03-29: qty 5

## 2023-03-29 MED ORDER — PHENYLEPHRINE 80 MCG/ML (10ML) SYRINGE FOR IV PUSH (FOR BLOOD PRESSURE SUPPORT)
PREFILLED_SYRINGE | INTRAVENOUS | Status: DC | PRN
  Administered 2023-03-29: 160 ug via INTRAVENOUS
  Administered 2023-03-29: 80 ug via INTRAVENOUS
  Administered 2023-03-29 (×2): 160 ug via INTRAVENOUS

## 2023-03-29 MED ORDER — OXYCODONE HCL 5 MG PO TABS
5.0000 mg | ORAL_TABLET | Freq: Once | ORAL | Status: AC | PRN
  Administered 2023-03-29: 5 mg via ORAL

## 2023-03-29 SURGICAL SUPPLY — 37 items
ADAPTER IRRIG TUBE 2 SPIKE SOL (ADAPTER) ×2 IMPLANT
ADPR TBG 2 SPK PMP STRL ASCP (ADAPTER) ×2
BAG DRN LRG CPC RND TRDRP CNTR (MISCELLANEOUS) ×1
BAG DRN RND TRDRP ANRFLXCHMBR (UROLOGICAL SUPPLIES) ×1
BAG URINE DRAIN 2000ML AR STRL (UROLOGICAL SUPPLIES) ×1 IMPLANT
BAG URO DRAIN 4000ML (MISCELLANEOUS) ×1 IMPLANT
CATH FOLEY 3WAY 30CC 24FR (CATHETERS) ×1
CATH URETL OPEN END 4X70 (CATHETERS) ×1 IMPLANT
CATH URTH STD 24FR FL 3W 2 (CATHETERS) ×1 IMPLANT
CONTAINER COLLECT MORCELLATR (MISCELLANEOUS) ×1 IMPLANT
DRAPE UTILITY 15X26 TOWEL STRL (DRAPES) IMPLANT
ELECT BIVAP BIPO 22/24 DONUT (ELECTROSURGICAL)
ELECTRD BIVAP BIPO 22/24 DONUT (ELECTROSURGICAL) IMPLANT
FIBER LASER MOSES 550 DFL (Laser) ×1 IMPLANT
FILTER OVERFLOW MORCELLATOR (FILTER) ×1 IMPLANT
GLOVE BIOGEL PI IND STRL 7.5 (GLOVE) ×1 IMPLANT
GOWN STRL REUS W/ TWL LRG LVL3 (GOWN DISPOSABLE) ×1 IMPLANT
GOWN STRL REUS W/ TWL XL LVL3 (GOWN DISPOSABLE) ×1 IMPLANT
GOWN STRL REUS W/TWL LRG LVL3 (GOWN DISPOSABLE) ×1
GOWN STRL REUS W/TWL XL LVL3 (GOWN DISPOSABLE) ×1
HOLDER FOLEY CATH W/STRAP (MISCELLANEOUS) ×1 IMPLANT
IV NS IRRIG 3000ML ARTHROMATIC (IV SOLUTION) ×5 IMPLANT
KIT TURNOVER CYSTO (KITS) ×1 IMPLANT
MBRN O SEALING YLW 17 FOR INST (MISCELLANEOUS) ×1
MEMBRANE SLNG YLW 17 FOR INST (MISCELLANEOUS) ×1 IMPLANT
MORCELLATOR COLLECT CONTAINER (MISCELLANEOUS) ×1
MORCELLATOR OVERFLOW FILTER (FILTER) ×1
MORCELLATOR ROTATION 4.75 335 (MISCELLANEOUS) ×1 IMPLANT
PACK CYSTO AR (MISCELLANEOUS) ×1 IMPLANT
SET CYSTO W/LG BORE CLAMP LF (SET/KITS/TRAYS/PACK) ×1 IMPLANT
SET IRRIG Y TYPE TUR BLADDER L (SET/KITS/TRAYS/PACK) ×1 IMPLANT
SLEEVE PROTECTION STRL DISP (MISCELLANEOUS) ×2 IMPLANT
SURGILUBE 2OZ TUBE FLIPTOP (MISCELLANEOUS) ×1 IMPLANT
SYR TOOMEY IRRIG 70ML (MISCELLANEOUS) ×1
SYRINGE TOOMEY IRRIG 70ML (MISCELLANEOUS) ×1 IMPLANT
TUBE PUMP MORCELLATOR PIRANHA (TUBING) ×1 IMPLANT
WATER STERILE IRR 1000ML POUR (IV SOLUTION) ×1 IMPLANT

## 2023-03-29 NOTE — Progress Notes (Signed)
Education given regarding the emptying of the foley catheter. Instruction was also given regarding the expected change in color but when to call the office. Patient verbalized understanding and given discharge information in writing. He stated he could read the information.

## 2023-03-29 NOTE — Transfer of Care (Signed)
Immediate Anesthesia Transfer of Care Note  Patient: Justin Hammond  Procedure(s) Performed: HOLEP-LASER ENUCLEATION OF THE PROSTATE WITH MORCELLATION  Patient Location: PACU  Anesthesia Type:General  Level of Consciousness: awake and drowsy  Airway & Oxygen Therapy: Patient Spontanous Breathing and Patient connected to face mask oxygen  Post-op Assessment: Report given to RN and Post -op Vital signs reviewed and stable  Post vital signs: stable  Last Vitals:  Vitals Value Taken Time  BP 129/82 03/29/23 0822  Temp    Pulse 62 03/29/23 0825  Resp 13 03/29/23 0825  SpO2 99 % 03/29/23 0825  Vitals shown include unfiled device data.  Last Pain:  Vitals:   03/29/23 0618  TempSrc: Temporal  PainSc: 0-No pain         Complications:  Encounter Notable Events  Notable Event Outcome Phase Comment  Difficult to intubate - expected  Intraprocedure Filed from anesthesia note documentation.

## 2023-03-29 NOTE — Op Note (Signed)
Date of procedure: 03/29/23  Preoperative diagnosis:  BPH with obstruction  Postoperative diagnosis:  Same  Procedure: HoLEP (Holmium Laser Enucleation of the Prostate)  Surgeon: Legrand Rams, MD  Anesthesia: General  Complications: None  Intraoperative findings:  Moderate size prostate with obstructive lateral lobes, elevated bladder neck Moderate bladder trabeculations, no suspicious lesions Ureteral orifices and verumontanum intact at conclusion of case, excellent hemostasis  EBL: Minimal  Specimens: Prostate chips  Enucleation time: 13 minutes  Morcellation time: 3 minutes  Intra-op weight: 22g  Drains: 24 French three-way, 60 cc in balloon  Indication: LUNDY BALDERAMA is a 57 y.o. patient with BPH and long history of obstructive urinary symptoms, incomplete emptying, who failed a trial of medical management.  He also has a history of left RCC status post left nephrectomy in [place] in September 2024.  After reviewing the management options for treatment, they elected to proceed with the above surgical procedure(s). We have discussed the potential benefits and risks of the procedure, side effects of the proposed treatment, the likelihood of the patient achieving the goals of the procedure, and any potential problems that might occur during the procedure or recuperation.  We specifically discussed the risks of bleeding, infection, hematuria and clot retention, need for additional procedures, possible overnight hospital stay, temporary urgency and incontinence, rare long-term incontinence, and retrograde ejaculation.  Informed consent has been obtained.   Description of procedure:  The patient was taken to the operating room and general anesthesia was induced.  The patient was placed in the dorsal lithotomy position, prepped and draped in the usual sterile fashion, and preoperative antibiotics(Ancef) were administered.  SCDs were placed for DVT prophylaxis.  A  preoperative time-out was performed.   Sissy Hoff sounds were used to gently dilated the urethra up to 46F. The 86 French continuous flow resectoscope was inserted into the urethra using the visual obturator  The prostate was moderate in size with an elevated bladder neck. The bladder was thoroughly inspected and notable for moderate bladder trabeculations but no suspicious lesions.  The ureteral orifices were located in orthotopic position.    The laser was set to 2 J and 60 Hz and early apical release was performed by making a circumferential mucosal incision proximal to the sphincter.  A lambda incision was then made proximal to the verumontanum.  The prostate was enucleated en bloc circumferentially into the bladder.  The prostate was quite fibrous.  The capsule was examined and laser was used for meticulous hemostasis.    The 49 French resectoscope was then switched out for the 26 French nephroscope and prostate tissue was morcellated(Piranha) and the tissue sent to pathology.  A 24 French three-way catheter was inserted easily with the aid of a catheter guide, and 60 cc were placed in the balloon.  Urine was clear.  The catheter irrigated easily with a Toomey syringe.  CBI was initiated.   The patient tolerated the procedure well without any immediate complications and was extubated and transferred to the recovery room in stable condition.  Urine was clear on fast CBI.  Disposition: Stable to PACU  Plan: Wean CBI in PACU, anticipate discharge home today with Foley removal in clinic in 2-3 days  Legrand Rams, MD 03/29/2023

## 2023-03-29 NOTE — Anesthesia Procedure Notes (Addendum)
Procedure Name: Intubation Date/Time: 03/29/2023 7:39 AM  Performed by: Rodney Booze, CRNAPre-anesthesia Checklist: Patient identified, Emergency Drugs available, Suction available and Patient being monitored Patient Re-evaluated:Patient Re-evaluated prior to induction Oxygen Delivery Method: Circle system utilized Preoxygenation: Pre-oxygenation with 100% oxygen Induction Type: IV induction Laryngoscope Size: McGraph and 3 Grade View: Grade I Tube type: Oral Tube size: 7.5 mm Number of attempts: 1 Airway Equipment and Method: Stylet and Oral airway (sniffing position) Placement Confirmation: ETT inserted through vocal cords under direct vision, positive ETCO2 and breath sounds checked- equal and bilateral Secured at: 22 cm Tube secured with: Tape Dental Injury: Teeth and Oropharynx as per pre-operative assessment  Comments: Limited neck mobility, but easy grade I view with Mcgrath VL

## 2023-03-29 NOTE — Anesthesia Postprocedure Evaluation (Signed)
Anesthesia Post Note  Patient: Justin Hammond  Procedure(s) Performed: HOLEP-LASER ENUCLEATION OF THE PROSTATE WITH MORCELLATION  Patient location during evaluation: PACU Anesthesia Type: General Level of consciousness: awake and alert Pain management: pain level controlled Vital Signs Assessment: post-procedure vital signs reviewed and stable Respiratory status: spontaneous breathing, nonlabored ventilation, respiratory function stable and patient connected to nasal cannula oxygen Cardiovascular status: blood pressure returned to baseline and stable Postop Assessment: no apparent nausea or vomiting Anesthetic complications: yes   Encounter Notable Events  Notable Event Outcome Phase Comment  Difficult to intubate - expected  Intraprocedure Filed from anesthesia note documentation.     Last Vitals:  Vitals:   03/29/23 0900 03/29/23 0944  BP: 117/70 (!) 142/78  Pulse: 62 61  Resp: 12 15  Temp:  (!) 36.3 C  SpO2: 96% 98%    Last Pain:  Vitals:   03/29/23 0944  TempSrc: Temporal  PainSc: 2                  Corinda Gubler

## 2023-03-29 NOTE — Anesthesia Preprocedure Evaluation (Signed)
Anesthesia Evaluation  Patient identified by MRN, date of birth, ID band Patient awake    Reviewed: Allergy & Precautions, NPO status , Patient's Chart, lab work & pertinent test results  History of Anesthesia Complications Negative for: history of anesthetic complications  Airway Mallampati: III  TM Distance: >3 FB Neck ROM: Full    Dental no notable dental hx. (+) Teeth Intact   Pulmonary sleep apnea and Continuous Positive Airway Pressure Ventilation , neg COPD, Patient abstained from smoking.Not current smoker   Pulmonary exam normal breath sounds clear to auscultation       Cardiovascular Exercise Tolerance: Good METShypertension, + CAD, + Past MI and + Cardiac Stents  (-) dysrhythmias  Rhythm:Regular Rate:Normal - Systolic murmurs Cardiologist deemed patient optimized at moderate risk for procedure.   Patient suffered an NSTEMI on 12/27/2016.  Troponins were trended: 0.21 --> 0.13 --> 0.21 --> 0.21 ng/mL.  Patient underwent diagnostic LEFT heart catheterization on 12/29/2022 revealing a total occlusion of the mid RCA with faint collaterals from the left.  Additionally, he had a 99% ostial to proximal LAD lesion.  Patient subsequently underwent PCI placing a 4.50 x 15 mm Resolute Onyx DES x 1 to the ostial to proximal LAD.     TTE performed on 12/24/2019 revealed a normal left ventricular systolic function with an EF of 60 to 65%.  There were no regional wall motion abnormalities.  Diastolic Doppler parameters were normal.  Right ventricular size and function normal with pulmonary artery systolic pressure was within normal limits. All transvalvular gradients were noted to be normal providing no evidence suggestive of valvular stenosis. Aorta normal in size with no evidence of aneurysmal dilatation.    Most recent myocardial perfusion imaging study was performed on 01/22/2023 revealing a normal left ventricular systolic function  with an EF of >65%.  There was no evidence of stress-induced myocardial ischemia or arrhythmia; no scintigraphic evidence of scar.  CT attenuation correction images showed coronary stent located in the proximal LAD.  Overall, study was determined to be low risk.    Neuro/Psych  Neuromuscular disease  negative psych ROS   GI/Hepatic ,GERD  Controlled and Medicated,,(+)     (-) substance abuse    Endo/Other  diabetes, Type 2  Morbid obesityGLP1 held for two weeks. Denies GI symptoms today  Renal/GU negative Renal ROS     Musculoskeletal   Abdominal   Peds  Hematology   Anesthesia Other Findings Past Medical History: No date: Arthritis 2007: Bladder cancer (HCC) No date: BPH (benign prostatic hyperplasia) No date: Cholelithiasis 12/28/2016: Coronary artery disease     Comment:  a.) LHC/PCI 12/28/2016: 100% mRCA with faint collaterals              from LEFT, 99% o-pLAD (4.50 x 15 mm Resolute Onyx DES);               b.) MV 12/29/2019: no ischemia; c.) MV 01/22/2023: no               ischemia No date: Essential hypertension No date: GERD (gastroesophageal reflux disease) 05/27/2019: History of 2019 novel coronavirus disease (COVID-19) 12/24/2019: History of echocardiogram     Comment:  a.) TTE 12/24/2019: EF 60-65%, no RWMAs, normal RVSF, no              valvulopathies No date: Hyperlipidemia No date: Long-term use of aspirin therapy No date: Morbid obesity with BMI of 40.0-44.9, adult (HCC) No date: Neuropathy of both feet 12/27/2016: NSTEMI (non-ST elevated  myocardial infarction) [facility])     Comment:  a.) troponins trended: 0.21 --> 0.13 --> 0.21 --> 0.21               ng/mL; b.) LHC 12/28/2016: 99% o-pLAD (4.5 x 15 mm               Resolute Onyx DES) No date: OSA on CPAP No date: Proteinuria 01/2023: Renal cell carcinoma, left (HCC)     Comment:  a.) s/p LEFT nephrectomy, adrenalectomy, and               retroperitoneal lymph node dissection 01/2023 No date: Type 2  diabetes mellitus with hyperlipidemia (HCC)  Reproductive/Obstetrics                             Anesthesia Physical Anesthesia Plan  ASA: 3  Anesthesia Plan: General   Post-op Pain Management: Ofirmev IV (intra-op)*   Induction: Intravenous and Rapid sequence  PONV Risk Score and Plan: 3 and Ondansetron, Dexamethasone and Midazolam  Airway Management Planned: Oral ETT and Video Laryngoscope Planned  Additional Equipment: None  Intra-op Plan:   Post-operative Plan: Extubation in OR  Informed Consent: I have reviewed the patients History and Physical, chart, labs and discussed the procedure including the risks, benefits and alternatives for the proposed anesthesia with the patient or authorized representative who has indicated his/her understanding and acceptance.     Dental advisory given  Plan Discussed with: CRNA and Surgeon  Anesthesia Plan Comments: (Discussed risks of anesthesia with patient, including PONV, sore throat, lip/dental/eye damage. Rare risks discussed as well, such as cardiorespiratory and neurological sequelae, and allergic reactions. Discussed the role of CRNA in patient's perioperative care. Patient understands.)       Anesthesia Quick Evaluation

## 2023-03-29 NOTE — H&P (Signed)
03/29/23 6:41 AM   [name] December 02, 1965 [id number]  CC: BPH with obstruction  HPI: Comorbid 57 year old male with morbid obesity, diabetes, recent left nephrectomy for large renal mass with Dr. Berneice Heinrich, with long history of BPH and obstructive symptoms with incomplete emptying who failed trial of medical management.  He is also tried OAB medications without improvement, PVRs have been approximately .  Prostate measures 46 g on recent CT, he also underwent cystoscopy with Dr. Lonna Cobb that showed significant lateral lobe enlargement with an elevated bladder neck and no suspicious bladder lesions.   PMH: Past Medical History:  Diagnosis Date   Arthritis    Bladder cancer (HCC) 2007   BPH (benign prostatic hyperplasia)    Cholelithiasis    Coronary artery disease 12/28/2016   a.) LHC/PCI 12/28/2016: 100% mRCA with faint collaterals from LEFT, 99% o-pLAD (4.50 x 15 mm Resolute Onyx DES); b.) MV 12/29/2019: no ischemia; c.) MV 01/22/2023: no ischemia   Essential hypertension    GERD (gastroesophageal reflux disease)    History of 2019 novel coronavirus disease (COVID-19) 05/27/2019   History of echocardiogram 12/24/2019   a.) TTE 12/24/2019: EF 60-65%, no RWMAs, normal RVSF, no valvulopathies   Hyperlipidemia    Long-term use of aspirin therapy    Morbid obesity with BMI of 40.0-44.9, adult (HCC)    Neuropathy of both feet    NSTEMI (non-ST elevated myocardial infarction) (HCC) 12/27/2016   a.) troponins trended: 0.21 --> 0.13 --> 0.21 --> 0.21 ng/mL; b.) LHC 12/28/2016: 99% o-pLAD (4.5 x 15 mm Resolute Onyx DES)   OSA on CPAP    Proteinuria    Renal cell carcinoma, left (HCC) 01/2023   a.) s/p LEFT nephrectomy, adrenalectomy, and retroperitoneal lymph node dissection 01/2023   Type 2 diabetes mellitus with hyperlipidemia (HCC)     Surgical History: Past Surgical History:  Procedure Laterality Date   APPENDECTOMY  1978   CORONARY STENT INTERVENTION N/A 12/28/2016    Procedure: CORONARY STENT INTERVENTION;  Surgeon: Dalia Heading, MD;  Location: [facility] INVASIVE CV LAB;  Service: Cardiovascular;  Laterality: N/A;   CORONARY STENT INTERVENTION N/A 12/28/2016   Procedure: CORONARY STENT INTERVENTION;  Surgeon: Marcina Millard, MD;  Location: [facility] INVASIVE CV LAB;  Service: Cardiovascular;  Laterality: N/A;   LEFT HEART CATH AND CORONARY ANGIOGRAPHY N/A 12/28/2016   Procedure: LEFT HEART CATH AND CORONARY ANGIOGRAPHY;  Surgeon: Dalia Heading, MD;  Location: [facility] INVASIVE CV LAB;  Service: Cardiovascular;  Laterality: N/A;   ROBOT ASSISTED LAPAROSCOPIC NEPHRECTOMY Left 01/30/2023   Procedure: XI ROBOTIC ASSISTED LEFT LAPAROSCOPIC RADICAL NEPHRECTOMY AND RETROPERITONEAL NODE DISSECTION;  Surgeon: Loletta Parish., MD;  Location: [facility];  Service: Urology;  Laterality: Left;  180 MINUTES   TRANSURETHRAL MICROWAVE THERAPY     2009, 2018    Family History: Family History  Problem Relation Age of Onset   Breast cancer Mother    CAD Father     Social History:  reports that he has never smoked. He has never used smokeless tobacco. He reports that he does not drink alcohol and does not use drugs.  Physical Exam: BP 119/71   Pulse (!) 109   Temp (!) 97.2 F (36.2 C) (Temporal)   Resp 16   SpO2 97%    Constitutional:  Alert and oriented, No acute distress. Cardiovascular: Regular rate and rhythm Respiratory: Clear to auscultation bilaterally GI: Abdomen is soft, nontender, nondistended, no abdominal masses   Laboratory Data: Urinalysis 10/28 benign, culture with  7k colonies beta-hemolytic Streptococcus likely contaminant   Assessment & Plan:   Comorbid 57 year old male with long history of obstructive urinary symptoms and incomplete emptying, failed trial of maximal medical therapy as well as OAB medications.  History of TUMT x 2 by Dr. Sheppard Penton in the past with no improvement.  We reviewed at length that we need to have realistic expectations  with his other comorbidities, morbid obesity, but that an outlet procedure will likely improve his emptying.  We discussed the risks and benefits of HoLEP at length.  The procedure requires general anesthesia and takes 1 to 2 hours, and a holmium laser is used to enucleate the prostate and push this tissue into the bladder.  A morcellator is then used to remove this tissue, which is sent for pathology.  The vast majority(>95%) of patients are able to discharge the same day with a catheter in place for 2 to 3 days, and will follow-up in clinic for a voiding trial.  We specifically discussed the risks of bleeding, infection, retrograde ejaculation, temporary urgency and urge incontinence, very low risk of long-term incontinence, urethral stricture/bladder neck contracture, pathologic evaluation of prostate tissue and possible detection of prostate cancer or other malignancy, and possible need for additional procedures. We also discussed risks of more serious complications including cardiac event, pulmonary embolus/DVT, stroke.  HOLEP today  Legrand Rams, MD 03/29/2023  [facility] [address] [place], [place] [address] [phone]

## 2023-04-01 ENCOUNTER — Ambulatory Visit (INDEPENDENT_AMBULATORY_CARE_PROVIDER_SITE_OTHER): Payer: BC Managed Care – PPO | Admitting: Physician Assistant

## 2023-04-01 VITALS — BP 147/71 | HR 61 | Ht 68.0 in | Wt 295.0 lb

## 2023-04-01 DIAGNOSIS — N138 Other obstructive and reflux uropathy: Secondary | ICD-10-CM

## 2023-04-01 DIAGNOSIS — N401 Enlarged prostate with lower urinary tract symptoms: Secondary | ICD-10-CM

## 2023-04-01 LAB — SURGICAL PATHOLOGY

## 2023-04-01 NOTE — Patient Instructions (Signed)

## 2023-04-01 NOTE — Progress Notes (Signed)
Catheter Removal  Patient is present today for a catheter removal.  61ml of water was drained from the balloon. A 24FR foley cath was removed from the bladder, no complications were noted. Patient tolerated well.  Performed by: Carman Ching, PA-C   Additional notes: Counseled patient on normal postoperative findings including dysuria, gross hematuria, and urinary urgency/leakage. Counseled patient to begin Kegel exercises 3x10 sets daily to increase urinary control and wear absorbent products as needed for security. Written and verbal resources provided today. Surgical pathology pending; will defer to Dr. Richardo Hanks to share results when available.   Follow up/ Additional notes: Return in about 12 weeks (around 06/24/2023) for Postop f/u with Dr. Richardo Hanks.

## 2023-04-02 ENCOUNTER — Telehealth: Payer: Self-pay

## 2023-04-02 NOTE — Telephone Encounter (Signed)
-----   Message from Sondra Come sent at 04/02/2023  7:56 AM EST ----- Good news, no worrisome findings on prostate tissue from [facility], keep follow-up as scheduled  Legrand Rams, MD 04/02/2023

## 2023-04-02 NOTE — Telephone Encounter (Signed)
Called pt informed him of the information below. Pt voiced understanding. Pt also inquired about urinary frequency and urgency, reiterated information pt was given yesterday on normal post op progression.

## 2023-04-22 ENCOUNTER — Telehealth: Payer: Self-pay

## 2023-04-22 NOTE — Telephone Encounter (Signed)
Patient states that he is still seen some hematuria, it stopped and started back again off and on and still having urgency. Surgery was on 03/29/23, went over his post op instructions that were given during his last visit. He does take Aspirin daily. Discussed that blood in the urine could take longer to stop as he is on Aspirin, it has been just 3 weeks post surgery, discussed what to look for when it does become a concern and advised again that urgency can take 3 months at least post surgery to start improving.

## 2023-05-18 DIAGNOSIS — M65311 Trigger thumb, right thumb: Secondary | ICD-10-CM | POA: Insufficient documentation

## 2023-06-27 ENCOUNTER — Ambulatory Visit: Payer: BC Managed Care – PPO | Admitting: Urology

## 2023-06-27 ENCOUNTER — Ambulatory Visit: Payer: 59 | Admitting: Urology

## 2023-06-27 VITALS — BP 146/66 | HR 76 | Ht 68.0 in | Wt 285.0 lb

## 2023-06-27 DIAGNOSIS — R3915 Urgency of urination: Secondary | ICD-10-CM | POA: Diagnosis not present

## 2023-06-27 DIAGNOSIS — N401 Enlarged prostate with lower urinary tract symptoms: Secondary | ICD-10-CM

## 2023-06-27 DIAGNOSIS — R350 Frequency of micturition: Secondary | ICD-10-CM

## 2023-06-27 DIAGNOSIS — N138 Other obstructive and reflux uropathy: Secondary | ICD-10-CM

## 2023-06-27 LAB — BLADDER SCAN AMB NON-IMAGING

## 2023-06-27 MED ORDER — GEMTESA 75 MG PO TABS: 75.0000 mg | ORAL_TABLET | Freq: Every day | ORAL | Status: DC

## 2023-06-27 NOTE — Progress Notes (Signed)
   06/27/2023 2:35 PM   Justin Hammond [birth year] [id number]  Reason for visit: Follow up BPH/LUTS status post HOLEP, history of RCC  HPI: Comorbid 58 year old male with morbid obesity BMI 43, diabetes(hemoglobin A1c 8), sleep apnea with CPAP, who has a long history of urinary symptoms including incomplete bladder emptying, urgency, frequency, straining, nocturia 3-4 times overnight.  He underwent 2 prior TUMT procedures with Dr. Gala with no significant improvement.  He was found to have a large 10 cm enhancing left renal mass and underwent a left robotic radical nephrectomy with Dr. Alvaro in September 2024 showing RCC, has not yet had follow-up with him, but he thinks he has an appointment in March.  We discussed the importance of surveillance imaging for follow-up of his kidney cancer, as well as consideration of adjuvant treatments with immunotherapy.  Workup of his urinary symptoms showed a 45 g prostate with obstructive appearing lateral lobes on cystoscopy and he underwent a HOLEP on 03/29/2023 with removal of 22 g of benign tissue.  He reports ongoing bothersome urgency and frequency during the day and night.  PVR today is normal at 38 mL and improved from prior to surgery.  He denies any gross hematuria or dysuria.  He drinks primarily diet sodas and tea during the day.  I had a long conversation with the patient about his improved bladder emptying, and that his ongoing urinary symptoms are likely secondary to his morbid obesity, high intake of bladder irritants, and poorly controlled diabetes causing glucosuria and urinary frequency.  We reviewed behavioral strategies at length.  I think is reasonable to trial a course of Gemtesa , and 4 weeks of samples were given today.  RTC 4 weeks UA, PVR, symptom check  Justin JAYSON Burnet, MD  [facility] [address], [address] [place], [place] [address] [phone]

## 2023-06-27 NOTE — Patient Instructions (Signed)
 Avoid diet drinks, sodas, and tea as these make urinary symptoms significantly worse.  We will try 4 weeks of medicine called Gemtesa  to help with the urgency and frequency of urination.

## 2023-07-11 ENCOUNTER — Emergency Department: Payer: Worker's Compensation

## 2023-07-11 ENCOUNTER — Ambulatory Visit: Payer: Self-pay | Admitting: Urology

## 2023-07-11 ENCOUNTER — Emergency Department
Admission: EM | Admit: 2023-07-11 | Discharge: 2023-07-11 | Disposition: A | Discharge status: Home / Self Care | Payer: Worker's Compensation | Attending: Emergency Medicine | Admitting: Emergency Medicine

## 2023-07-11 ENCOUNTER — Ambulatory Visit: Payer: BC Managed Care – PPO | Admitting: Urology

## 2023-07-11 ENCOUNTER — Other Ambulatory Visit: Payer: Self-pay

## 2023-07-11 DIAGNOSIS — Y9389 Activity, other specified: Secondary | ICD-10-CM | POA: Diagnosis not present

## 2023-07-11 DIAGNOSIS — W000XXA Fall on same level due to ice and snow, initial encounter: Secondary | ICD-10-CM | POA: Insufficient documentation

## 2023-07-11 DIAGNOSIS — S39012A Strain of muscle, fascia and tendon of lower back, initial encounter: Secondary | ICD-10-CM

## 2023-07-11 DIAGNOSIS — M545 Low back pain, unspecified: Secondary | ICD-10-CM | POA: Diagnosis present

## 2023-07-11 DIAGNOSIS — W19XXXA Unspecified fall, initial encounter: Secondary | ICD-10-CM

## 2023-07-11 MED ORDER — METHOCARBAMOL 500 MG PO TABS: 500.0000 mg | ORAL_TABLET | Freq: Three times a day (TID) | ORAL | 0 refills | Status: DC | PRN

## 2023-07-11 MED ORDER — LIDOCAINE 5 % EX PTCH: 1.0000 | MEDICATED_PATCH | Freq: Two times a day (BID) | CUTANEOUS | 1 refills | Status: DC

## 2023-07-11 MED ORDER — LIDOCAINE 5 % EX PTCH
1.0000 | MEDICATED_PATCH | CUTANEOUS | Status: DC
  Administered 2023-07-11: 1 via TRANSDERMAL
  Filled 2023-07-11: qty 1

## 2023-07-11 MED ORDER — OXYCODONE HCL 5 MG PO TABS
5.0000 mg | ORAL_TABLET | Freq: Once | ORAL | Status: AC
  Administered 2023-07-11: 5 mg via ORAL
  Filled 2023-07-11: qty 1

## 2023-07-11 MED ORDER — ACETAMINOPHEN 500 MG PO TABS
1000.0000 mg | ORAL_TABLET | Freq: Once | ORAL | Status: AC
  Administered 2023-07-11: 1000 mg via ORAL
  Filled 2023-07-11: qty 2

## 2023-07-11 NOTE — ED Notes (Signed)
Pts employer advised no WC drug test needed.

## 2023-07-11 NOTE — Discharge Instructions (Signed)
 Use Tylenol for pain and fevers.  Up to 1000 mg per dose, up to 4 times per day.  Do not take more than 4000 mg of Tylenol/acetaminophen within 24 hours..  Please use lidocaine patches at your site of pain.  Apply 1 patch at a time, leave on for 12 hours, then remove for 12 hours.  12 hours on, 12 hours off.  Do not apply more than 1 patch at a time.  Use Robaxin muscle relaxer as needed for more severe/breakthrough pain, up to 3 times per day. This medication can make some people sleepy, so do not use while driving, working or Designer, television/film set

## 2023-07-11 NOTE — ED Triage Notes (Addendum)
Pt arrived via POV with supervisor reports he slipped on ice and fell onto his back. C/o back pain states his whole back hurts, pt ambulatory.    Pt works for [employer], does not require any DOT drug screening per supervisor.

## 2023-07-11 NOTE — ED Provider Notes (Signed)
[facility] Provider Note    Event Date/Time   First MD Initiated Contact with Patient 07/11/23 [phone]     (approximate)   History   Fall and Back Pain   HPI  Justin Hammond is a 58 y.o. male who presents to the ED for evaluation of Fall and Back Pain   I review a urology clinic visit from a couple weeks ago.  History of BPH s/p HoLEP.  History of RCC.  Morbid obesity, DM and sleep apnea.  CAD.  Aspirin 81 without other documented anticoagulation.  Patient presents to the ED after he slipped on some ice and fell backwards and struck his back, causing pain.  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  Did not strike his head, did not lose consciousness.  No preceding illnesses.  No pain to the extremities.  Pain throughout his lower and mid back.  Has been up and ambulatory since then   Physical Exam   Triage Vital Signs: ED Triage Vitals  Encounter Vitals Group     BP 07/11/23 0516 115/65     Systolic BP Percentile --      Diastolic BP Percentile --      Pulse Rate 07/11/23 0516 64     Resp 07/11/23 0516 18     Temp 07/11/23 0516 97.8 F (36.6 C)     Temp Source 07/11/23 0516 Oral     SpO2 07/11/23 0516 96 %     Weight 07/11/23 0533 285 lb (129.3 kg)     Height 07/11/23 0533 5\' 8"  (1.727 m)     Head Circumference --      Peak Flow --      Pain Score 07/11/23 0533 5     Pain Loc --      Pain Education --      Exclude from Growth Chart --     Most recent vital signs: Vitals:   07/11/23 0516  BP: 115/65  Pulse: 64  Resp: 18  Temp: 97.8 F (36.6 C)  SpO2: 96%    General: Awake, no distress.  Morbidly obese, able to sit upright with my assistance so I can examine his back. CV:  Good peripheral perfusion.  Resp:  Normal effort.  Abd:  No distention.  MSK:  No deformity noted.  Fairly diffuse and mild tenderness throughout his paraspinal bilateral back.  Has some more point tenderness to the vertebrae  to the lower thoracic back.  No step-offs.  No cervical tenderness No tenderness to the 4 extremities Neuro:  No focal deficits appreciated. Other:     ED Results / Procedures / Treatments   Labs (all labs ordered are listed, but only abnormal results are displayed) Labs Reviewed - No data to display  EKG   RADIOLOGY CXR interpreted by me without evidence of acute cardiopulmonary pathology. Plain film of the lumbar spine interpreted by me without signs of fracture or dislocation. Plain film of the thoracic spine interpreted by me without signs of fracture or dislocation  Official radiology report(s): DG Lumbar Spine Complete Result Date: 07/11/2023 CLINICAL DATA:  Fall backwards on ice this morning. Diffuse upper and lower back pain. EXAM: LUMBAR SPINE - COMPLETE 4+ VIEW COMPARISON:  CT abdomen and pelvis 02/10/2023 FINDINGS: There are 5 non-rib-bearing lumbar type vertebrae. Trace retrolisthesis of L1 on L2 and L2 on L3 is unchanged. No acute fracture is identified. Solid bridging anterior vertebral osteophytes are  again seen from L2-S1, and a large anterior osteophyte is again noted at L1-2 without complete fusion. There is mild disc space narrowing throughout the lumbar spine. Multilevel facet arthrosis is present, and there is bilateral facet ankylosis at L3-4. IMPRESSION: 1. No acute osseous abnormality identified in the lumbar spine. 2. Lumbar spondylosis and facet arthrosis with multilevel ankylosis. Electronically Signed   By: Sebastian Ache M.D.   On: 07/11/2023 08:34   DG Thoracic Spine 2 View Result Date: 07/11/2023 CLINICAL DATA:  Back pain after fall on ice today. EXAM: THORACIC SPINE 2 VIEWS COMPARISON:  None Available. FINDINGS: There is no evidence of thoracic spine fracture. Alignment is normal. Mild to moderate osteophyte formation is seen throughout the mid to lower thoracic spine. IMPRESSION: Multilevel degenerative changes.  No acute abnormality seen. Electronically  Signed   By: Lupita Raider M.D.   On: 07/11/2023 08:32   DG Chest 2 View Result Date: 07/11/2023 CLINICAL DATA:  Back pain after fall on ice today. EXAM: CHEST - 2 VIEW COMPARISON:  December 06, 2022. FINDINGS: The heart size and mediastinal contours are within normal limits. Both lungs are clear. The visualized skeletal structures are unremarkable. IMPRESSION: No active cardiopulmonary disease. Electronically Signed   By: Lupita Raider M.D.   On: 07/11/2023 08:28    PROCEDURES and INTERVENTIONS:  Procedures  Medications  lidocaine (LIDODERM) 5 % 1 patch (1 patch Transdermal Patch Applied 07/11/23 0755)  acetaminophen (TYLENOL) tablet 1,000 mg (1,000 mg Oral Given 07/11/23 0754)  oxyCODONE (Oxy IR/ROXICODONE) immediate release tablet 5 mg (5 mg Oral Given 07/11/23 0754)     IMPRESSION / MDM / ASSESSMENT AND PLAN / ED COURSE  I reviewed the triage vital signs and the nursing notes.  Differential diagnosis includes, but is not limited to, soft tissue injury, compression fracture, cauda equina, ICH, pneumothorax  {Patient presents with symptoms of an acute illness or injury that is potentially life-threatening.  Patient presents after a fall and striking his back without signs of bony injury or neurologic deficits, suitable for outpatient management.  X-rays, as above.  Suspect soft tissue injury.  Improving symptoms with multimodal analgesia.  Discharged with the same.  Discussed exercises, expectant management and return precautions.  Clinical Course as of 07/11/23 0902  Thu Jul 11, 2023  0859 Reassessed.  He reports improving pain.  Discussed x-ray results, soft tissue injury, expectant management at home with medications and simple exercises/stretches.  Discussed return precautions. [DS]    Clinical Course User Index [DS] Delton Prairie, MD     FINAL CLINICAL IMPRESSION(S) / ED DIAGNOSES   Final diagnoses:  Fall, initial encounter  Strain of lumbar region, initial encounter      Rx / DC Orders   ED Discharge Orders          Ordered    lidocaine (LIDODERM) 5 %  Every 12 hours        07/11/23 0900    methocarbamol (ROBAXIN) 500 MG tablet  Every 8 hours PRN        07/11/23 0900             Note:  This document was prepared using Dragon voice recognition software and may include unintentional dictation errors.   Delton Prairie, MD 07/11/23 [phone]

## 2023-07-25 ENCOUNTER — Ambulatory Visit: Payer: 59 | Admitting: Physician Assistant

## 2023-07-25 VITALS — BP 152/72 | HR 55 | Ht 68.0 in | Wt 285.0 lb

## 2023-07-25 DIAGNOSIS — R3914 Feeling of incomplete bladder emptying: Secondary | ICD-10-CM | POA: Diagnosis not present

## 2023-07-25 DIAGNOSIS — R350 Frequency of micturition: Secondary | ICD-10-CM | POA: Diagnosis not present

## 2023-07-25 DIAGNOSIS — N401 Enlarged prostate with lower urinary tract symptoms: Secondary | ICD-10-CM

## 2023-07-25 LAB — BLADDER SCAN AMB NON-IMAGING: Scan Result: 68

## 2023-07-25 MED ORDER — GEMTESA 75 MG PO TABS: 75.0000 mg | ORAL_TABLET | Freq: Every day | ORAL | 5 refills | Status: DC

## 2023-07-25 NOTE — Progress Notes (Signed)
 07/25/2023 3:56 PM   [name] 07-May-1966 [id number]  CC: Chief Complaint  Patient presents with   Benign Prostatic Hypertrophy   HPI: Justin Hammond is a 58 y.o. male with PMH diabetes, OSA on CPAP, RCC s/p left nephrectomy with Dr. Berneice Heinrich in September 2024, and BPH s/p HOLEP with Dr. Richardo Hanks in November 2024 who reported bothersome urgency/frequency at his postop follow-up who presents today for symptom recheck on Gemtesa.   Today he reports improvement in urgency and urge incontinence on Gemtesa.  He still describes urinary frequency during the day and night.  He has been having episodes of a sore, red penis that resolves on its own.  He is scheduled for postop follow-up with Dr. Berneice Heinrich within the next month.  PVR 68mL.  PMH: Past Medical History:  Diagnosis Date   Arthritis    Bladder cancer (HCC) 2007   BPH (benign prostatic hyperplasia)    Cholelithiasis    Coronary artery disease 12/28/2016   a.) LHC/PCI 12/28/2016: 100% mRCA with faint collaterals from LEFT, 99% o-pLAD (4.50 x 15 mm Resolute Onyx DES); b.) MV 12/29/2019: no ischemia; c.) MV 01/22/2023: no ischemia   Essential hypertension    GERD (gastroesophageal reflux disease)    History of 2019 novel coronavirus disease (COVID-19) 05/27/2019   History of echocardiogram 12/24/2019   a.) TTE 12/24/2019: EF 60-65%, no RWMAs, normal RVSF, no valvulopathies   Hyperlipidemia    Long-term use of aspirin therapy    Morbid obesity with BMI of 40.0-44.9, adult (HCC)    Neuropathy of both feet    NSTEMI (non-ST elevated myocardial infarction) (HCC) 12/27/2016   a.) troponins trended: 0.21 --> 0.13 --> 0.21 --> 0.21 ng/mL; b.) LHC 12/28/2016: 99% o-pLAD (4.5 x 15 mm Resolute Onyx DES)   OSA on CPAP    Proteinuria    Renal cell carcinoma, left (HCC) 01/2023   a.) s/p LEFT nephrectomy, adrenalectomy, and retroperitoneal lymph node dissection 01/2023   Type 2 diabetes mellitus with hyperlipidemia (HCC)      Surgical History: Past Surgical History:  Procedure Laterality Date   APPENDECTOMY  1978   CORONARY STENT INTERVENTION N/A 12/28/2016   Procedure: CORONARY STENT INTERVENTION;  Surgeon: Dalia Heading, MD;  Location: [facility];  Service: Cardiovascular;  Laterality: N/A;   CORONARY STENT INTERVENTION N/A 12/28/2016   Procedure: CORONARY STENT INTERVENTION;  Surgeon: Marcina Millard, MD;  Location: [facility];  Service: Cardiovascular;  Laterality: N/A;   HOLEP-LASER ENUCLEATION OF THE PROSTATE WITH MORCELLATION N/A 03/29/2023   Procedure: HOLEP-LASER ENUCLEATION OF THE PROSTATE WITH MORCELLATION;  Surgeon: Sondra Come, MD;  Location: [facility];  Service: Urology;  Laterality: N/A;   LEFT HEART CATH AND CORONARY ANGIOGRAPHY N/A 12/28/2016   Procedure: LEFT HEART CATH AND CORONARY ANGIOGRAPHY;  Surgeon: Dalia Heading, MD;  Location: [facility];  Service: Cardiovascular;  Laterality: N/A;   ROBOT ASSISTED LAPAROSCOPIC NEPHRECTOMY Left 01/30/2023   Procedure: XI ROBOTIC ASSISTED LEFT LAPAROSCOPIC RADICAL NEPHRECTOMY AND RETROPERITONEAL NODE DISSECTION;  Surgeon: Loletta Parish., MD;  Location: [facility];  Service: Urology;  Laterality: Left;  180 MINUTES   TRANSURETHRAL MICROWAVE THERAPY     2009, 2018    Home Medications:  Allergies as of 07/25/2023       Reactions   Penicillins Itching   Tolerated cefepime and ancef in past        Medication List        Accurate as of  July 25, 2023  3:56 PM. If you have any questions, ask your nurse or doctor.          aspirin EC 81 MG tablet Take 81 mg by mouth daily. Swallow whole.   atorvastatin 40 MG tablet Commonly known as: LIPITOR Take 1 tablet (40 mg total) by mouth daily.   carvedilol 3.125 MG tablet Commonly known as: COREG Take 1 tablet (3.125 mg total) by mouth 2 (two) times daily with a meal.   clotrimazole-betamethasone cream Commonly known as: LOTRISONE Apply 1  Application topically 2 (two) times daily. Apply twice daily to head of the penis   docusate sodium 100 MG capsule Commonly known as: COLACE Take 1 capsule (100 mg total) by mouth 2 (two) times daily. What changed:  when to take this reasons to take this   gabapentin 400 MG capsule Commonly known as: NEURONTIN Take 1 capsule by mouth 3 (three) times daily.   Gemtesa 75 MG Tabs Generic drug: Vibegron Take 1 tablet (75 mg total) by mouth daily.   glimepiride 4 MG tablet Commonly known as: AMARYL Take 2-4 mg by mouth See admin instructions. Take 4 mg by mouth in the morning and 2 mg at night   HYDROcodone-acetaminophen 5-325 MG tablet Commonly known as: Norco Take 1-2 tablets by mouth every 6 (six) hours as needed for moderate pain or severe pain.   Jardiance 25 MG Tabs tablet Generic drug: empagliflozin Take 25 mg by mouth every morning.   lidocaine 5 % Commonly known as: Lidoderm Place 1 patch onto the skin every 12 (twelve) hours. Remove & Discard patch within 12 hours or as directed by MD   losartan 100 MG tablet Commonly known as: COZAAR Take 1 tablet by mouth daily.   metFORMIN 500 MG tablet Commonly known as: GLUCOPHAGE Take 1,000 mg by mouth 2 (two) times daily.   methocarbamol 500 MG tablet Commonly known as: ROBAXIN Take 1 tablet (500 mg total) by mouth every 8 (eight) hours as needed.   NON FORMULARY Pt uses a cpap nightly   omeprazole 40 MG capsule Commonly known as: PRILOSEC Take 40 mg by mouth daily.   Ozempic (1 MG/DOSE) 4 MG/3ML Sopn Generic drug: Semaglutide (1 MG/DOSE) Inject 1 mg into the skin once a week. Saturday        Allergies:  Allergies  Allergen Reactions   Penicillins Itching    Tolerated cefepime and ancef in past    Family History: Family History  Problem Relation Age of Onset   Breast cancer Mother    CAD Father     Social History:   reports that he has never smoked. He has never used smokeless tobacco. He reports  that he does not drink alcohol and does not use drugs.  Physical Exam: BP (!) 152/72   Pulse (!) 55   Ht 5\' 8"  (1.727 m)   Wt 285 lb (129.3 kg)   BMI 43.33 kg/m   Constitutional:  Alert and oriented, no acute distress, nontoxic appearing HEENT: [place], AT Cardiovascular: No clubbing, cyanosis, or edema Respiratory: Normal respiratory effort, no increased work of breathing Skin: No rashes, bruises or suspicious lesions Neurologic: Grossly intact, no focal deficits, moving all 4 extremities Psychiatric: Normal mood and affect  Laboratory Data: Results for orders placed or performed in visit on 06/27/23  Bladder Scan (Post Void Residual) in office   Collection Time: 06/27/23  2:25 PM  Result Value Ref Range   Scan Result 38mL    Assessment &  Plan:   1. Benign prostatic hyperplasia with urinary frequency (Primary) Some improvement in urgency and urge incontinence on Gemtesa.  Will continue this and have him follow-up with Dr. Richardo Hanks in 3 months.  With reports of intermittent penile soreness, I offered to probe his urethra for possible fossa navicularis stricture, but he declined. - Bladder Scan (Post Void Residual) in office - Vibegron (GEMTESA) 75 MG TABS; Take 1 tablet (75 mg total) by mouth daily.  Dispense: 30 tablet; Refill: 5   Return in about 3 months (around 10/25/2023) for IPSS/PVR with Dr. Richardo Hanks.  Carman Ching, PA-C  [facility] [place] [address] [place], [place] [address] [phone]

## 2023-08-29 ENCOUNTER — Telehealth: Payer: Self-pay | Admitting: Urology

## 2023-08-29 NOTE — Telephone Encounter (Signed)
 Pt called inquiring about his PA for Gemtesa.

## 2023-08-30 NOTE — Telephone Encounter (Signed)
 Okay to offer him Gemtesa samples for now while we get this sorted out.  Any history of constipation, dry mouth, or dry eye?  Myrbetriq is contraindicated with his history of hypertension.  If he has any of the above, let's appeal with his insurance company given no suitable alternative.

## 2023-08-30 NOTE — Telephone Encounter (Signed)
 Patient left another unhappy message on the triage voicemail about not hearing back about his [employer].  I looked on covermymeds and PA was done-not sure by whom as there is no documentation in the chart, PA was denied and per insurance patient has to try at least one generic antispasmodic drug first. Sending to Sam as this was the provider that sent in RX for Gemtesa to review next steps.

## 2023-09-02 MED ORDER — GEMTESA 75 MG PO TABS: 75.0000 mg | ORAL_TABLET | Freq: Every day | ORAL | Status: AC

## 2023-09-02 NOTE — Telephone Encounter (Signed)
 Patient called back and is very unhappy that he has not been contacted by our office. He is requesting a call back to discuss medication.

## 2023-09-02 NOTE — Telephone Encounter (Signed)
 Informed pt that we will get an appeal on his medication but for now we will give him some gemtesa samples. Pt states he has history of dry mouth and constipation.pt was informed that we will send an appeal of his medication and will contact him when we get an answer from his insurance. Pt voiced understanding.

## 2023-09-04 NOTE — Telephone Encounter (Signed)
Appeal sent electronically

## 2023-10-24 ENCOUNTER — Ambulatory Visit: Admitting: Urology

## 2023-10-24 VITALS — BP 144/68 | HR 78 | Ht 68.0 in | Wt 308.1 lb

## 2023-10-24 DIAGNOSIS — N401 Enlarged prostate with lower urinary tract symptoms: Secondary | ICD-10-CM | POA: Diagnosis not present

## 2023-10-24 DIAGNOSIS — R350 Frequency of micturition: Secondary | ICD-10-CM | POA: Diagnosis not present

## 2023-10-24 LAB — BLADDER SCAN AMB NON-IMAGING: Scan Result: 38

## 2023-10-24 NOTE — Patient Instructions (Addendum)
 Your persistent urinary symptoms are most likely caused by your high intake of diet drinks, sodas, and tea.  Your urinary symptoms will likely improve significantly by drinking just water .  Weight loss and improved control your diabetes will also help.  Unfortunately, some of the medicines you take for diabetes also get rid of extra sugar through the urine which causes urinary frequency.   Nocturia refers to the need to wake up during the night to urinate, which can disrupt your sleep and impact your overall well-being. Fortunately, there are several strategies you can employ to help prevent or manage nocturia. It's important to consult with your healthcare provider before making any significant changes to your routine. Here are some helpful strategies to consider:  Limit Fluid Intake Before Bed: Avoid drinking large amounts of fluids in the evening, especially within a few hours of bedtime. Consume most of your daily fluid intake earlier in the day to reduce the need to urinate at night.  Monitor Your Diet: Limit your intake of caffeine and alcohol, as these substances can increase urine production and irritate the bladder.  Avoid diet, "zero calorie," and artificially sweetened drinks, especially sodas, in the afternoon or evening. Be mindful of consuming foods and drinks with high water  content before bedtime, such as watermelon and herbal teas.  Time Your Medications: If you're taking medications that contribute to increased urination, consult your healthcare provider about adjusting the timing of these medications to minimize their impact during the night.  Practice Double Voiding: Before going to bed, make an effort to empty your bladder twice within a short period. This can help reduce the amount of urine left in your bladder before sleep.  Bladder Training: Gradually increase the time between bathroom visits during the day to train your bladder to hold larger volumes of urine. Over time,  this can help reduce the frequency of nighttime awakenings to urinate.  Elevate Your Legs During the Day: Elevating your legs during the day can help minimize fluid retention in your lower extremities, which might reduce nighttime urination.  Pelvic Floor Exercises: Strengthening your pelvic floor muscles through Kegel exercises can help improve bladder control and potentially reduce the urge to urinate at night.  Create a Relaxing Bedtime Routine: Stress and anxiety can exacerbate nocturia. Engage in calming activities before bed, such as reading, listening to soothing music, or practicing relaxation techniques.  Stay Active: Engage in regular physical activity, but avoid intense exercise close to bedtime, as this can increase your body's demand for fluids.  Maintain a Healthy Weight: Excess weight can compress the bladder and contribute to bladder and urinary issues. Aim to achieve and maintain a healthy weight through a balanced diet and regular exercise.  Remember that every individual is unique, and the effectiveness of these strategies may vary. It's important to work with your healthcare provider to develop a plan that suits your specific needs and addresses any underlying causes of nocturia.

## 2023-10-24 NOTE — Progress Notes (Signed)
   10/24/2023 3:46 PM   Justin Hammond 1965-06-30 [id number]  Reason for visit: Follow up BPH status post HOLEP, urinary symptoms, history of RCC  HPI: Comorbid 58 year old male with morbid obesity BMI of 47, poorly controlled diabetes(hemoglobin A1c 8 who underwent HOLEP in November 2024 for obstructive urinary symptoms and incomplete emptying with PVRs > .  He also was found to have a large left renal mass and underwent radical left nephrectomy with Dr. 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  He continues to have bothersome urinary urgency and frequency during the day, as well as nocturia 4 times overnight.  He is compliant with CPAP.  It does not sound like he is having any significant incontinence.  He remains very bothered by his urinary symptoms.  He continues to drink only diet soda and tea during the day, he states water  does not taste good and he will not drink it.  I had a very frank conversation with the patient that his morbid obesity with BMI of 47 and high intake of bladder irritants is likely the reason for his persistent urinary symptoms.  He is emptying 10x better than before surgery with normal PVRs, including normal at 30ml today.  He denies any dysuria or gross hematuria.  He is currently on Gemtesa  which she does feel is helpful.  -Continue Gemtesa , refilled -Long conversation today about realistic expectations with his morbid obesity, high intake of bladder irritants, poorly controlled diabetes contributing to his urinary symptoms -Continue follow-up with Dr. Secundino Dach in [place] for history of RCC and surveillance imaging -RTC 1 year PVR  Lawerence Pressman, MD  [facility] [address] [place], [place] [address] [phone]

## 2023-11-18 ENCOUNTER — Ambulatory Visit: Payer: Self-pay

## 2023-11-18 ENCOUNTER — Telehealth: Payer: Self-pay

## 2023-11-18 NOTE — Telephone Encounter (Signed)
 Reason for Disposition  [1] Follow-up call to recent contact AND [2] information only call, no triage required  Answer Assessment - Initial Assessment Questions 1. REASON FOR CALL or QUESTION: What is your reason for calling today? or How can I best help you? or What question do you have that I can help answer?     Message left on chart pt was calling back about was given to pt and it stated, By law we can not prescribe medication until he is a patient. 11/18/23 at 14:59. Pt states he is out, nurse explained pt will need to establish care with a PCP before they can refill scripts. Pt understands, hangs up shortly after nurse asked if pt had any mor questions.  Protocols used: Information Only Call - No Triage-A-AH

## 2023-11-18 NOTE — Telephone Encounter (Signed)
 Left message for patient to call, E2C2 triage nurse may give message  By law we can not prescribe medication until he is a patient     Copied from CRM [id number]. Topic: Clinical - Medication Question >> Nov 18, 2023 12:37 PM Justin Hammond wrote: Patient is running out prescriptions annd wants to know what he needs to do. He wants to know can they be filled before his appointment in July

## 2023-11-19 NOTE — Telephone Encounter (Signed)
 Called patient and reports he just talked to someone and scheduled for the 28 th of this month.

## 2023-12-16 ENCOUNTER — Other Ambulatory Visit: Payer: Self-pay | Admitting: Family Medicine

## 2023-12-16 ENCOUNTER — Encounter: Payer: Self-pay | Admitting: Family Medicine

## 2023-12-16 ENCOUNTER — Ambulatory Visit: Admitting: Family Medicine

## 2023-12-16 VITALS — BP 126/71 | HR 72 | Temp 98.6°F | Ht 68.0 in | Wt 305.0 lb

## 2023-12-16 DIAGNOSIS — N1832 Chronic kidney disease, stage 3b: Secondary | ICD-10-CM

## 2023-12-16 DIAGNOSIS — I10 Essential (primary) hypertension: Secondary | ICD-10-CM

## 2023-12-16 DIAGNOSIS — Z7984 Long term (current) use of oral hypoglycemic drugs: Secondary | ICD-10-CM

## 2023-12-16 DIAGNOSIS — Z85528 Personal history of other malignant neoplasm of kidney: Secondary | ICD-10-CM

## 2023-12-16 DIAGNOSIS — Z7689 Persons encountering health services in other specified circumstances: Secondary | ICD-10-CM

## 2023-12-16 DIAGNOSIS — R350 Frequency of micturition: Secondary | ICD-10-CM

## 2023-12-16 DIAGNOSIS — G4733 Obstructive sleep apnea (adult) (pediatric): Secondary | ICD-10-CM

## 2023-12-16 DIAGNOSIS — E1142 Type 2 diabetes mellitus with diabetic polyneuropathy: Secondary | ICD-10-CM | POA: Diagnosis not present

## 2023-12-16 DIAGNOSIS — E1122 Type 2 diabetes mellitus with diabetic chronic kidney disease: Secondary | ICD-10-CM | POA: Diagnosis not present

## 2023-12-16 DIAGNOSIS — Z79899 Other long term (current) drug therapy: Secondary | ICD-10-CM

## 2023-12-16 DIAGNOSIS — N401 Enlarged prostate with lower urinary tract symptoms: Secondary | ICD-10-CM

## 2023-12-16 DIAGNOSIS — K219 Gastro-esophageal reflux disease without esophagitis: Secondary | ICD-10-CM

## 2023-12-16 DIAGNOSIS — B353 Tinea pedis: Secondary | ICD-10-CM

## 2023-12-16 DIAGNOSIS — E782 Mixed hyperlipidemia: Secondary | ICD-10-CM

## 2023-12-16 DIAGNOSIS — Z1211 Encounter for screening for malignant neoplasm of colon: Secondary | ICD-10-CM

## 2023-12-16 MED ORDER — OMEPRAZOLE 40 MG PO CPDR: 40.0000 mg | DELAYED_RELEASE_CAPSULE | Freq: Every day | ORAL | 1 refills | Status: AC

## 2023-12-16 MED ORDER — SEMAGLUTIDE (2 MG/DOSE) 8 MG/3ML ~~LOC~~ SOPN: 2.0000 mg | PEN_INJECTOR | SUBCUTANEOUS | 1 refills | Status: AC

## 2023-12-16 MED ORDER — KETOCONAZOLE 2 % EX CREA: 1.0000 | TOPICAL_CREAM | Freq: Every day | CUTANEOUS | 0 refills | Status: DC

## 2023-12-16 MED ORDER — LOSARTAN POTASSIUM 100 MG PO TABS: 100.0000 mg | ORAL_TABLET | Freq: Every day | ORAL | 1 refills | Status: AC

## 2023-12-16 NOTE — Assessment & Plan Note (Signed)
 Removed by Dr. 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 Alvaro annually; defer to specialist management.

## 2023-12-16 NOTE — Progress Notes (Signed)
 New patient visit   Patient: Justin Hammond   DOB: 03/03/1966   58 y.o. Male  MRN: [id number] Visit Date: 12/16/2023  Today's healthcare provider: LAURAINE LOISE BUOY, DO   Chief Complaint  Patient presents with   Establish Care   Sleep Apnea    Patient needs to have new supplies for his CPAP   Subjective    Justin Hammond is a 58 y.o. male who presents today as a new patient to establish care.   HPI HPI     Sleep Apnea    Additional comments: Patient needs to have new supplies for his CPAP      Last edited by Kathi Buel BIRCH, CMA on 12/16/2023  3:32 PM.       Elza JINNY Granville Hammond is a 58 year old male who presents to establish care and obtain new CPAP supplies.  He requires new supplies for his CPAP machine, including a new mask and headgear, which he uses for sleep apnea management.  He has a history of diabetes and is currently on semaglutide  (Ozempic ) 2 mg weekly. He recently received a one-month supply and prefers a three-month supply. He does not regularly check his blood sugars. His last A1c was 9.4%. He also takes metformin 500 mg twice daily and glimepiride  4 mg daily in the morning and 2 mg daily in the afternoon, which he sometimes forgets to take.  He has a history of coronary artery disease and had a stent placed in 2018. He is under the care of a new cardiologist, Dr. Elane, after his previous cardiologist retired. He takes atorvastatin , carvedilol  3.125 mg twice daily, and a baby aspirin . No chest pain or shortness of breath, except when it was hot outside.  He has a history of kidney cancer, for which he had a kidney removed in September of the previous year. He sees a urologist annually for follow-up. He experiences urgency and frequency of urination, and he gets up frequently at night. He has had prostate surgery and two microwave surgeries, but he still experiences frequent urination.  He experiences pain in his feet and legs, for which he takes  gabapentin , although he often only takes it twice a day instead of the prescribed three times. He also uses baclofen as a muscle relaxer occasionally for pain relief.  He has a history of reflux and takes omeprazole . He also takes losartan  100 mg, which was prescribed by Dr. Corlis.  He has a history of a cortisone shot in his hand from [facility] and may require surgery as it was previously suggested to him.  He has dry, cracked feet and uses lotion and antibacterial cream to manage this. He reports a place on his foot that was hurting yesterday.  He has received a COVID booster and a tetanus vaccine in 2021. He is unsure about his pneumonia vaccine status but believes he received one last year. He has not had a shingles vaccine.     Past Medical History:  Diagnosis Date   Arthritis    Bladder cancer (HCC) 2007   BPH (benign prostatic hyperplasia)    CHF (congestive heart failure) (HCC)    Cholelithiasis    Coronary artery disease 12/28/2016   a.) LHC/PCI 12/28/2016: 100% mRCA with faint collaterals from LEFT, 99% o-pLAD (4.50 x 15 mm Resolute Onyx DES); b.) MV 12/29/2019: no ischemia; c.) MV 01/22/2023: no ischemia   Essential hypertension    GERD (gastroesophageal reflux disease)    History  of 2019 novel coronavirus disease (COVID-19) 05/27/2019   History of echocardiogram 12/24/2019   a.) TTE 12/24/2019: EF 60-65%, no RWMAs, normal RVSF, no valvulopathies   Hyperlipidemia    Long-term use of aspirin  therapy    Morbid obesity with BMI of 40.0-44.9, adult (HCC)    Neuropathy of both feet    NSTEMI (non-ST elevated myocardial infarction) (HCC) 12/27/2016   a.) troponins trended: 0.21 --> 0.13 --> 0.21 --> 0.21 ng/mL; b.) LHC 12/28/2016: 99% o-pLAD (4.5 x 15 mm Resolute Onyx DES)   OSA on CPAP    Proteinuria    Renal cell carcinoma, left (HCC) 01/2023   a.) s/p LEFT nephrectomy, adrenalectomy, and retroperitoneal lymph node dissection 01/2023   Sleep apnea    Type 2 diabetes  mellitus with hyperlipidemia [facility])    Past Surgical History:  Procedure Laterality Date   APPENDECTOMY  1978   CORONARY STENT INTERVENTION N/A 12/28/2016   Procedure: CORONARY STENT INTERVENTION;  Surgeon: Bosie Vinie LABOR, MD;  Location: [facility] INVASIVE CV LAB;  Service: Cardiovascular;  Laterality: N/A;   CORONARY STENT INTERVENTION N/A 12/28/2016   Procedure: CORONARY STENT INTERVENTION;  Surgeon: Ammon Blunt, MD;  Location: [facility] INVASIVE CV LAB;  Service: Cardiovascular;  Laterality: N/A;   HOLEP-LASER ENUCLEATION OF THE PROSTATE WITH MORCELLATION N/A 03/29/2023   Procedure: HOLEP-LASER ENUCLEATION OF THE PROSTATE WITH MORCELLATION;  Surgeon: Francisca Redell BROCKS, MD;  Location: [facility] ORS;  Service: Urology;  Laterality: N/A;   LEFT HEART CATH AND CORONARY ANGIOGRAPHY N/A 12/28/2016   Procedure: LEFT HEART CATH AND CORONARY ANGIOGRAPHY;  Surgeon: Bosie Vinie LABOR, MD;  Location: [facility] INVASIVE CV LAB;  Service: Cardiovascular;  Laterality: N/A;   PROSTATE SURGERY     ROBOT ASSISTED LAPAROSCOPIC NEPHRECTOMY Left 01/30/2023   Procedure: XI ROBOTIC ASSISTED LEFT LAPAROSCOPIC RADICAL NEPHRECTOMY AND RETROPERITONEAL NODE DISSECTION;  Surgeon: Alvaro Ricardo KATHEE Mickey., MD;  Location: [facility] ORS;  Service: Urology;  Laterality: Left;  180 MINUTES   TRANSURETHRAL MICROWAVE THERAPY     2009, 2018   Family Status  Relation Name Status   Mother  (Not Specified)   Father  (Not Specified)  No partnership data on file   Family History  Problem Relation Age of Onset   Breast cancer Mother    CAD Father    Social History   Socioeconomic History   Marital status: Single    Spouse name: Not on file   Number of children: Not on file   Years of education: Not on file   Highest education level: Not on file  Occupational History   Not on file  Tobacco Use   Smoking status: Never   Smokeless tobacco: Never  Vaping Use   Vaping status: Never Used  Substance and Sexual Activity   Alcohol use: No    Drug use: Never   Sexual activity: Not Currently  Other Topics Concern   Not on file  Social History Narrative   Works for [employer]; no smoking; no alcohol. [place]; with self; no children.    Social Drivers of Corporate investment banker Strain: Not on file  Food Insecurity: No Food Insecurity (01/30/2023)   Hunger Vital Sign    Worried About Running Out of Food in the Last Year: Never true    Ran Out of Food in the Last Year: Never true  Recent Concern: Food Insecurity - Food Insecurity Present (01/30/2023)   Hunger Vital Sign    Worried About Running Out of Food in the  Last Year: Never true    Ran Out of Food in the Last Year: Sometimes true  Transportation Needs: Unknown (01/30/2023)   PRAPARE - Administrator, Civil Service (Medical): No    Lack of Transportation (Non-Medical): Patient unable to answer  Physical Activity: Not on file  Stress: Not on file  Social Connections: Not on file   Outpatient Medications Prior to Visit  Medication Sig Note   aspirin  EC 81 MG tablet Take 81 mg by mouth daily. Swallow whole.    atorvastatin  (LIPITOR) 40 MG tablet Take 1 tablet (40 mg total) by mouth daily.    carvedilol  (COREG ) 3.125 MG tablet Take 1 tablet (3.125 mg total) by mouth 2 (two) times daily with a meal.    docusate sodium  (COLACE) 100 MG capsule Take 1 capsule (100 mg total) by mouth 2 (two) times daily. (Patient taking differently: Take 100 mg by mouth 2 (two) times daily as needed.)    gabapentin  (NEURONTIN ) 400 MG capsule Take 1 capsule by mouth 3 (three) times daily.    glimepiride  (AMARYL ) 4 MG tablet Take 2-4 mg by mouth See admin instructions. Take 4 mg by mouth in the morning and 2 mg at night    JARDIANCE 25 MG TABS tablet Take 25 mg by mouth every morning.    metFORMIN (GLUCOPHAGE) 500 MG tablet Take 1,000 mg by mouth 2 (two) times daily.    [DISCONTINUED] losartan  (COZAAR ) 100 MG tablet Take 1 tablet by mouth daily.     [DISCONTINUED] Semaglutide , 2 MG/DOSE, 8 MG/3ML SOPN Inject 2 mg into the skin once a week.    clotrimazole -betamethasone  (LOTRISONE ) cream Apply 1 Application topically 2 (two) times daily. Apply twice daily to head of the penis    NON FORMULARY Pt uses a cpap nightly    Vibegron  (GEMTESA ) 75 MG TABS Take 1 tablet (75 mg total) by mouth daily.    [DISCONTINUED] HYDROcodone -acetaminophen  (NORCO) 5-325 MG tablet Take 1-2 tablets by mouth every 6 (six) hours as needed for moderate pain or severe pain.    [DISCONTINUED] lidocaine  (LIDODERM ) 5 % Place 1 patch onto the skin every 12 (twelve) hours. Remove & Discard patch within 12 hours or as directed by MD    [DISCONTINUED] methocarbamol  (ROBAXIN ) 500 MG tablet Take 1 tablet (500 mg total) by mouth every 8 (eight) hours as needed.    [DISCONTINUED] omeprazole  (PRILOSEC ) 40 MG capsule Take 40 mg by mouth daily.    [DISCONTINUED] Semaglutide , 1 MG/DOSE, (OZEMPIC , 1 MG/DOSE,) 4 MG/3ML SOPN Inject 1 mg into the skin once a week. Saturday 12/16/2023: by previous provider (Dr. Corlis)   No facility-administered medications prior to visit.   Allergies  Allergen Reactions   Penicillins Itching    Tolerated cefepime  and ancef  in past    Immunization History  Administered Date(s) Administered   Janssen (J&J) SARS-COV-2 Vaccination 05/31/2020   Tdap 05/27/2019    Health Maintenance  Topic Date Due   FOOT EXAM  Never done   OPHTHALMOLOGY EXAM  Never done   Diabetic kidney evaluation - Urine ACR  Never done   Hepatitis C Screening  Never done   Pneumococcal Vaccine 66-76 Years old (1 of 2 - PCV) Never done   Fecal DNA (Cologuard)  Never done   Zoster Vaccines- Shingrix (1 of 2) Never done   HEMOGLOBIN A1C  06/08/2023   COVID-19 Vaccine (3 - 2024-25 season) 02/19/2024 (Originally 01/20/2023)   INFLUENZA VACCINE  12/20/2023   Diabetic kidney evaluation - eGFR  measurement  02/10/2024   DTaP/Tdap/Td (2 - Td or Tdap) 05/26/2029   HIV Screening  Completed    Hepatitis B Vaccines  Aged Out   HPV VACCINES  Aged Out   Meningococcal B Vaccine  Aged Out    Patient Care Team: Keysha Damewood, Lauraine SAILOR, DO as PCP - General (Family Medicine) Rennie Cindy SAUNDERS, MD as Consulting Physician (Oncology)       Objective    BP 126/71 (BP Location: Right Arm, Patient Position: Sitting, Cuff Size: Large)   Pulse 72   Temp 98.6 F (37 C) (Oral)   Ht 5' 8 (1.727 m)   Wt (!) 305 lb (138.3 kg)   SpO2 97%   BMI 46.38 kg/m     Physical Exam Vitals reviewed.  Constitutional:      General: He is not in acute distress.    Appearance: Normal appearance. He is not diaphoretic.  HENT:     Head: Normocephalic and atraumatic.  Eyes:     General: No scleral icterus.    Conjunctiva/sclera: Conjunctivae normal.  Cardiovascular:     Rate and Rhythm: Normal rate and regular rhythm.     Pulses: Normal pulses.     Heart sounds: Normal heart sounds. No murmur heard. Pulmonary:     Effort: Pulmonary effort is normal. No respiratory distress.     Breath sounds: Normal breath sounds. No wheezing or rhonchi.  Musculoskeletal:     Cervical back: Neck supple.     Right lower leg: No edema.     Left lower leg: No edema.       Feet:  Feet:     Comments: Fissure present as noted; with redness/cracking area scattered on sole of foot. Lymphadenopathy:     Cervical: No cervical adenopathy.  Skin:    General: Skin is warm and dry.     Findings: No rash.  Neurological:     Mental Status: He is alert and oriented to person, place, and time. Mental status is at baseline.  Psychiatric:        Mood and Affect: Mood normal.        Behavior: Behavior normal.     Depression Screen    12/16/2023    3:16 PM 12/31/2022    2:07 PM  PHQ 2/9 Scores  PHQ - 2 Score 1 0  PHQ- 9 Score 7    No results found for any visits on 12/16/23.  Assessment & Plan     Type 2 diabetes mellitus with stage 3b chronic kidney disease, without long-term current use of insulin  (HCC) -      Semaglutide  (2 MG/DOSE); Inject 2 mg into the skin once a week.  Dispense: 9 mL; Refill: 1 -     Microalbumin / creatinine urine ratio  Establishing care with new doctor, encounter for  History of renal cell carcinoma Assessment & Plan: Removed by Dr. 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 Alvaro annually; defer to specialist management.   Diabetic polyneuropathy associated with type 2 diabetes mellitus (HCC)  Essential hypertension -     Losartan  Potassium; Take 1 tablet (100 mg total) by mouth daily.  Dispense: 90 tablet; Refill: 1  Mixed hyperlipidemia  Benign prostatic hyperplasia with urinary frequency  OSA (obstructive sleep apnea)  Tinea pedis of left foot -     Ketoconazole ; Apply 1 Application topically daily. To right foot  Dispense: 60  g; Refill: 0  Gastroesophageal reflux disease, unspecified whether esophagitis present -     Omeprazole ; Take 1 capsule (40 mg total) by mouth daily.  Dispense: 90 capsule; Refill: 1  Encounter for colorectal cancer screening -     Cologuard      Establishing care with a new doctor, encounter for Discussed Cologuard for colon cancer screening. Recommended shingles and pneumococcal vaccines. - Send Cologuard kit for colon cancer screening. - Recommend shingles vaccine. - Recommend pneumococcal vaccine.  Type 2 Diabetes Mellitus with stage 3b chronic kidney disease and polyneuropathy A1c elevated at 9.4. Emphasized glycemic control and regular ophthalmologic exams to prevent complications. - Send prescription for semaglutide  for a three-month supply. - Order uACR to assess renal function. - Plan follow-up in 6-8 weeks to recheck blood work. - Recommend scheduling an ophthalmologic exam. - Continue gabapentin .  Chronic Kidney Disease (Post Nephrectomy); history of renal cell carcinoma Under regular urologist follow-up  post-nephrectomy for renal carcinoma. - Order uACR to assess renal function given decreased function and concomitant diabetes.  Hypertension Chronic, stable.  Blood pressure well-controlled today.  Continue carvedilol  3.125 mg twice daily, prescribed by cardiology, and continue losartan  100 mg daily.. - Send prescription for losartan  100 mg daily.  Hyperlipidemia Managed with atorvastatin  40 mg daily.  Benign Prostatic Hyperplasia Persistent urinary frequency post-thermotherapy and surgery. - Follows with urology; defer to specialist management.  Chronic Pain Managed with gabapentin , occasional analgesics, and muscle relaxants. - Refill gabapentin .  Foot Fissure; tinea pedis of left foot Painful fissure present. Recommended antifungal cream. - Send prescription for antifungal cream. - Advise applying cream once daily.  Sleep Apnea Requires new CPAP supplies. - Order new CPAP mask and headgear.    Follow-up Plan follow-up in 6-8 weeks for blood work and foot exam. - Plan follow-up in 6-8 weeks. - Order blood work separately to accommodate fasting. - Perform foot exam at next visit.  Return in about 7 weeks (around 02/03/2024) for DM, Chronic f/u.     I discussed the assessment and treatment plan with the patient  The patient was provided an opportunity to ask questions and all were answered. The patient agreed with the plan and demonstrated an understanding of the instructions.   The patient was advised to call back or seek an in-person evaluation if the symptoms worsen or if the condition fails to improve as anticipated.    LAURAINE LOISE BUOY, DO  [facility] [phone] (phone) [phone] (fax)  [facility]

## 2023-12-23 ENCOUNTER — Telehealth: Payer: Self-pay | Admitting: Family Medicine

## 2023-12-23 NOTE — Telephone Encounter (Signed)
 Copied from CRM [id number]. Topic: Clinical - Medical Advice >> Dec 23, 2023 10:11 AM Charolett L wrote: Reason for CRM: patient calling for the order status of his CPAP mask/head gear and wanted a call back

## 2023-12-23 NOTE — Telephone Encounter (Signed)
 Patient advised, per [facility]

## 2023-12-25 ENCOUNTER — Encounter: Payer: Self-pay | Admitting: Family Medicine

## 2023-12-25 ENCOUNTER — Ambulatory Visit: Admitting: Family Medicine

## 2023-12-25 ENCOUNTER — Ambulatory Visit: Payer: Self-pay

## 2023-12-25 VITALS — BP 93/54 | HR 70 | Temp 98.4°F | Ht 68.0 in | Wt 295.9 lb

## 2023-12-25 DIAGNOSIS — E785 Hyperlipidemia, unspecified: Secondary | ICD-10-CM

## 2023-12-25 DIAGNOSIS — J069 Acute upper respiratory infection, unspecified: Secondary | ICD-10-CM

## 2023-12-25 DIAGNOSIS — J029 Acute pharyngitis, unspecified: Secondary | ICD-10-CM | POA: Diagnosis not present

## 2023-12-25 DIAGNOSIS — J0120 Acute ethmoidal sinusitis, unspecified: Secondary | ICD-10-CM | POA: Diagnosis not present

## 2023-12-25 DIAGNOSIS — E1142 Type 2 diabetes mellitus with diabetic polyneuropathy: Secondary | ICD-10-CM

## 2023-12-25 DIAGNOSIS — E1169 Type 2 diabetes mellitus with other specified complication: Secondary | ICD-10-CM

## 2023-12-25 DIAGNOSIS — B353 Tinea pedis: Secondary | ICD-10-CM

## 2023-12-25 LAB — POC COVID19/FLU A&B COMBO
Covid Antigen, POC: NEGATIVE
Influenza A Antigen, POC: NEGATIVE
Influenza B Antigen, POC: NEGATIVE

## 2023-12-25 LAB — POCT RAPID STREP A (OFFICE): Rapid Strep A Screen: NEGATIVE

## 2023-12-25 MED ORDER — BENZONATATE 100 MG PO CAPS: 100.0000 mg | ORAL_CAPSULE | Freq: Two times a day (BID) | ORAL | 0 refills | Status: DC | PRN

## 2023-12-25 MED ORDER — AZITHROMYCIN 250 MG PO TABS: ORAL_TABLET | ORAL | 0 refills | Status: AC

## 2023-12-25 MED ORDER — ALBUTEROL SULFATE HFA 108 (90 BASE) MCG/ACT IN AERS: 2.0000 | INHALATION_SPRAY | Freq: Four times a day (QID) | RESPIRATORY_TRACT | 2 refills | Status: AC | PRN

## 2023-12-25 NOTE — Telephone Encounter (Signed)
 FYI Only or Action Required?: FYI only for provider.  Patient was last seen in primary care on 12/16/2023 by Donzella Lauraine SAILOR, DO.  Called Nurse Triage reporting Sore Throat.  Symptoms began Monday and woresning.  Interventions attempted: Nothing.  Symptoms are: gradually worsening.  Triage Disposition: See Physician Within 24 Hours  Patient/caregiver understands and will follow disposition?: Yes   Copied from CRM #[id number]. Topic: Clinical - Red Word Triage >> Dec 25, 2023  8:04 AM Suzen RAMAN wrote: Red Word that prompted transfer to Nurse Triage: sore throat,chills,body aches and pain Reason for Disposition  SEVERE throat pain (e.g., excruciating)  Answer Assessment - Initial Assessment Questions 1. ONSET: When did the throat start hurting? (Hours or days ago)      Monday 2. SEVERITY: How bad is the sore throat? (Scale 1-10; mild, moderate or severe)     5/10 3. STREP EXPOSURE: Has there been any exposure to strep within the past week? If Yes, ask: What type of contact occurred?      na 4.  VIRAL SYMPTOMS: Are there any symptoms of a cold, such as a runny nose, cough, hoarse voice or red eyes?      Cough-productive-murky color, body aches,  5. FEVER: Do you have a fever? If Yes, ask: What is your temperature, how was it measured, and when did it start?     no 6. PUS ON THE TONSILS: Is there pus on the tonsils in the back of your throat?     no 7. OTHER SYMPTOMS: Do you have any other symptoms? (e.g., difficulty breathing, headache, rash)     Headache at times 8. PREGNANCY: Is there any chance you are pregnant? When was your last menstrual period?     na  Protocols used: Sore Throat-A-AH

## 2023-12-25 NOTE — Progress Notes (Signed)
 Established patient visit   Patient: Justin Hammond   DOB: 08-Jul-1965   58 y.o. Male  MRN: [id number] Visit Date: 12/25/2023  Today's healthcare provider: LAURAINE LOISE BUOY, DO   Chief Complaint  Patient presents with   Acute Visit    Patient is here, he reports that he has a sore throat.  Started on Sunday or Monday, reports that he has a cough and has some phlegm with the cough.  States that the phlegm is murky.  Patient reports that he has a headache, muscle aches, and chills.  No fever as he knows of.  Patient reports a little bit of congestion in his nose.  States that he was taking Tylenol  (cannot take ibuprofen /NSAIDs) and a congestion medication that was [employer].   Subjective    HPI Justin Hammond is a 58 year old male who presents with symptoms of upper respiratory infection and sinus congestion.  He has been experiencing headache, muscle aches, chills, and congestion since Sunday, with today being Wednesday. He denies fever but feels unusually cold. He recalls a similar past incident where he was diagnosed with viral infections in his blood and stool. Currently, he is using an [employer] guaifensesin XR 600 mg and Tylenol  for symptom relief, as he cannot take NSAIDs like ibuprofen  or Aleve. Tylenol  provides some relief, especially when taken before bed. He experiences a stuffy nose and sometimes has to expectorate mucus. Shortness of breath occurs even while sitting, and he has a history of using a CPAP machine for sleep apnea.  He describes intermittent constipation and diarrhea and recently completed a Cologuard test. He experiences postnasal drip and has been feeling fatigued with activity changes. He cannot recall the last time he had a sinus infection.  He reports that he has not had any ulcers or wounds that wouldn't heal. Occasional calf pain occurs when walking, and he has been to a podiatrist in the past for toenail  issues.       Medications: Outpatient Medications Prior to Visit  Medication Sig   aspirin  EC 81 MG tablet Take 81 mg by mouth daily. Swallow whole.   atorvastatin  (LIPITOR) 40 MG tablet Take 1 tablet (40 mg total) by mouth daily.   carvedilol  (COREG ) 3.125 MG tablet Take 1 tablet (3.125 mg total) by mouth 2 (two) times daily with a meal.   clotrimazole -betamethasone  (LOTRISONE ) cream Apply 1 Application topically 2 (two) times daily. Apply twice daily to head of the penis   docusate sodium  (COLACE) 100 MG capsule Take 1 capsule (100 mg total) by mouth 2 (two) times daily. (Patient taking differently: Take 100 mg by mouth 2 (two) times daily as needed.)   gabapentin  (NEURONTIN ) 400 MG capsule Take 1 capsule by mouth 3 (three) times daily.   glimepiride  (AMARYL ) 4 MG tablet Take 2-4 mg by mouth See admin instructions. Take 4 mg by mouth in the morning and 2 mg at night   JARDIANCE 25 MG TABS tablet Take 25 mg by mouth every morning.   ketoconazole  (NIZORAL ) 2 % cream Apply 1 Application topically daily. To right foot   losartan  (COZAAR ) 100 MG tablet Take 1 tablet (100 mg total) by mouth daily.   metFORMIN (GLUCOPHAGE) 500 MG tablet Take 1,000 mg by mouth 2 (two) times daily.   NON FORMULARY Pt uses a cpap nightly   omeprazole  (PRILOSEC ) 40 MG capsule Take 1 capsule (40 mg total) by mouth daily.   Semaglutide , 2 MG/DOSE, 8  MG/3ML SOPN Inject 2 mg into the skin once a week.   Vibegron  (GEMTESA ) 75 MG TABS Take 1 tablet (75 mg total) by mouth daily.   No facility-administered medications prior to visit.    Review of Systems  Constitutional:  Positive for activity change and fatigue. Negative for chills and fever.  HENT:  Positive for congestion, rhinorrhea, sinus pressure and sore throat. Negative for ear pain and sinus pain.   Respiratory:  Positive for cough (intermittently productive) and shortness of breath. Negative for wheezing.   Cardiovascular:  Negative for chest pain,  palpitations and leg swelling.  Neurological:  Negative for weakness and headaches.        Objective    BP (!) 93/54 (BP Location: Left Arm, Patient Position: Sitting, Cuff Size: Large)   Pulse 70   Temp 98.4 F (36.9 C) (Oral)   Ht 5' 8 (1.727 m)   Wt 295 lb 14.4 oz (134.2 kg)   SpO2 100%   BMI 44.99 kg/m     Physical Exam Vitals reviewed.  Constitutional:      General: He is not in acute distress.    Appearance: Normal appearance. He is not diaphoretic.  HENT:     Head: Normocephalic and atraumatic.     Right Ear: Tympanic membrane, ear canal and external ear normal.     Left Ear: Tympanic membrane, ear canal and external ear normal.     Nose: Congestion present.     Mouth/Throat:     Mouth: Mucous membranes are moist.     Pharynx: Oropharynx is clear. No oropharyngeal exudate.  Eyes:     General: No scleral icterus.    Conjunctiva/sclera: Conjunctivae normal.  Cardiovascular:     Rate and Rhythm: Normal rate and regular rhythm.     Pulses: Normal pulses.     Heart sounds: Normal heart sounds. No murmur heard. Pulmonary:     Effort: Pulmonary effort is normal. No respiratory distress.     Breath sounds: Normal breath sounds. No wheezing or rhonchi.  Musculoskeletal:     Cervical back: Neck supple.     Right lower leg: No edema.     Left lower leg: No edema.  Lymphadenopathy:     Cervical: No cervical adenopathy.  Skin:    General: Skin is warm and dry.     Findings: No rash.  Neurological:     Mental Status: He is alert and oriented to person, place, and time. Mental status is at baseline.  Psychiatric:        Mood and Affect: Mood normal.        Behavior: Behavior normal.      Results for orders placed or performed in visit on 12/25/23  Microalbumin / creatinine urine ratio  Result Value Ref Range   Creatinine, Urine 111.4 Not Estab. mg/dL   Microalbumin, Urine 471.8 Not Estab. ug/mL   Microalb/Creat Ratio 474 (H) 0 - 29 mg/g creat  Specimen  status report  Result Value Ref Range   specimen status report Comment   POCT rapid strep A  Result Value Ref Range   Rapid Strep A Screen Negative Negative  POC Covid19/Flu A&B Antigen  Result Value Ref Range   Influenza A Antigen, POC Negative Negative   Influenza B Antigen, POC Negative Negative   Covid Antigen, POC Negative Negative    Assessment & Plan    Sore throat -     POCT rapid strep A -     POC Covid19/Flu  A&B Antigen -     Specimen status report  Acute non-recurrent ethmoidal sinusitis -     Azithromycin ; Take 2 tablets on day 1, then 1 tablet daily on days 2 through 5  Dispense: 6 tablet; Refill: 0 -     Albuterol  Sulfate HFA; Inhale 2 puffs into the lungs every 6 (six) hours as needed for wheezing or shortness of breath.  Dispense: 8 g; Refill: 2  Viral upper respiratory tract infection -     Benzonatate ; Take 1 capsule (100 mg total) by mouth 2 (two) times daily as needed for cough.  Dispense: 20 capsule; Refill: 0  Diabetic polyneuropathy associated with type 2 diabetes mellitus (HCC)  Type 2 diabetes mellitus with hyperlipidemia (HCC) -     Microalbumin / creatinine urine ratio  Tinea pedis of both feet     Acute sinusitis and acute upper respiratory infection Likely viral upper respiratory infection with bacterial sinusitis. No fever. Dyspnea noted while sitting. - Prescribed azithromycin  for sinusitis. - Continue mucus relief medication twice daily. - Encouraged rest and adequate hydration. - Prescribed inhaler for dyspnea, use as needed up to every six hours. - Prescribed benzonatate  for cough, use twice daily as needed.  Peripheral neuropathy of the feet Neuropathy in feet with reduced sensation.  Diabetes management is important to prevent progression. - Check sensation in feet. - Encourage control of blood sugars to prevent progression.  Tinea pedis (athlete's foot) Athlete's foot noted. - Prescribed ketoconazole  cream for athlete's foot  previously; patient advised to continue it.  Obstructive sleep apnea, on CPAP Uses CPAP machine. Needs new mask and headgear. Insurance will cover supplies. - Sent prescription for CPAP supplies including mask and headgear to [facility].    Return if symptoms worsen or fail to improve.      I discussed the assessment and treatment plan with the patient  The patient was provided an opportunity to ask questions and all were answered. The patient agreed with the plan and demonstrated an understanding of the instructions.   The patient was advised to call back or seek an in-person evaluation if the symptoms worsen or if the condition fails to improve as anticipated.    LAURAINE LOISE BUOY, DO  [facility] [phone] (phone) [phone] (fax)  [facility]

## 2023-12-26 LAB — VITAMIN D 25 HYDROXY (VIT D DEFICIENCY, FRACTURES): Vit D, 25-Hydroxy: 22.4 ng/mL — ABNORMAL LOW (ref 30.0–100.0)

## 2023-12-26 LAB — COMPREHENSIVE METABOLIC PANEL WITH GFR
ALT: 58 IU/L — ABNORMAL HIGH (ref 0–44)
AST: 64 IU/L — ABNORMAL HIGH (ref 0–40)
Albumin: 4.6 g/dL (ref 3.8–4.9)
Alkaline Phosphatase: 150 IU/L — ABNORMAL HIGH (ref 44–121)
BUN/Creatinine Ratio: 11 (ref 10–24)
BUN: 21 mg/dL (ref 8–27)
Bilirubin Total: 0.4 mg/dL (ref 0.0–1.2)
CO2: 20 mmol/L (ref 20–29)
Calcium: 9.6 mg/dL (ref 8.6–10.2)
Chloride: 100 mmol/L (ref 96–106)
Creatinine, Ser: 1.9 mg/dL — ABNORMAL HIGH (ref 0.76–1.27)
Globulin, Total: 2.7 g/dL (ref 1.5–4.5)
Glucose: 109 mg/dL — ABNORMAL HIGH (ref 70–99)
Potassium: 5.3 mmol/L — ABNORMAL HIGH (ref 3.5–5.2)
Sodium: 140 mmol/L (ref 134–144)
Total Protein: 7.3 g/dL (ref 6.0–8.5)
eGFR: 40 mL/min/1.73 — ABNORMAL LOW (ref >59)

## 2023-12-26 LAB — VITAMIN B12: Vitamin B-12: 474 pg/mL (ref 232–1245)

## 2023-12-26 LAB — MICROALBUMIN / CREATININE URINE RATIO
Creatinine, Urine: 111.4 mg/dL
Microalb/Creat Ratio: 474 mg/g{creat} — ABNORMAL HIGH (ref 0–29)
Microalbumin, Urine: 528.1 ug/mL

## 2023-12-26 LAB — SPECIMEN STATUS REPORT

## 2023-12-26 LAB — LIPID PANEL
Chol/HDL Ratio: 4.6 ratio (ref 0.0–5.0)
Cholesterol, Total: 134 mg/dL (ref 100–199)
HDL: 29 mg/dL — ABNORMAL LOW (ref >39)
LDL Chol Calc (NIH): 59 mg/dL (ref 0–99)
Triglycerides: 294 mg/dL — ABNORMAL HIGH (ref 0–149)
VLDL Cholesterol Cal: 46 mg/dL — ABNORMAL HIGH (ref 5–40)

## 2023-12-26 LAB — HEMOGLOBIN A1C
Est. average glucose Bld gHb Est-mCnc: 214 mg/dL
Hgb A1c MFr Bld: 9.1 % — ABNORMAL HIGH (ref 4.8–5.6)

## 2023-12-28 LAB — COLOGUARD: COLOGUARD: NEGATIVE

## 2023-12-31 DIAGNOSIS — M1812 Unilateral primary osteoarthritis of first carpometacarpal joint, left hand: Secondary | ICD-10-CM | POA: Insufficient documentation

## 2024-01-01 ENCOUNTER — Ambulatory Visit: Payer: Self-pay | Admitting: Family Medicine

## 2024-01-01 ENCOUNTER — Encounter: Payer: Self-pay | Admitting: Family Medicine

## 2024-01-01 DIAGNOSIS — E559 Vitamin D deficiency, unspecified: Secondary | ICD-10-CM

## 2024-01-01 DIAGNOSIS — N1832 Chronic kidney disease, stage 3b: Secondary | ICD-10-CM

## 2024-01-01 MED ORDER — VITAMIN D (ERGOCALCIFEROL) 1.25 MG (50000 UNIT) PO CAPS: 50000.0000 [IU] | ORAL_CAPSULE | ORAL | 1 refills | Status: DC

## 2024-01-15 DIAGNOSIS — N1832 Chronic kidney disease, stage 3b: Secondary | ICD-10-CM | POA: Insufficient documentation

## 2024-01-15 LAB — HM HEPATITIS C SCREENING LAB: HM Hepatitis Screen: NEGATIVE

## 2024-02-03 ENCOUNTER — Ambulatory Visit: Admitting: Family Medicine

## 2024-02-03 ENCOUNTER — Encounter: Payer: Self-pay | Admitting: Family Medicine

## 2024-02-03 VITALS — BP 129/60 | HR 57 | Ht 68.0 in | Wt 300.0 lb

## 2024-02-03 DIAGNOSIS — B353 Tinea pedis: Secondary | ICD-10-CM

## 2024-02-03 DIAGNOSIS — Z23 Encounter for immunization: Secondary | ICD-10-CM

## 2024-02-03 DIAGNOSIS — E1169 Type 2 diabetes mellitus with other specified complication: Secondary | ICD-10-CM | POA: Diagnosis not present

## 2024-02-03 DIAGNOSIS — Z7984 Long term (current) use of oral hypoglycemic drugs: Secondary | ICD-10-CM | POA: Diagnosis not present

## 2024-02-03 DIAGNOSIS — E785 Hyperlipidemia, unspecified: Secondary | ICD-10-CM

## 2024-02-03 MED ORDER — KETOCONAZOLE 2 % EX CREA: 1.0000 | TOPICAL_CREAM | Freq: Every day | CUTANEOUS | 0 refills | Status: AC

## 2024-02-03 NOTE — Progress Notes (Signed)
 Established patient visit   Patient: Justin Hammond   DOB: 19-Jan-1966   58 y.o. Male  MRN: [id number] Visit Date: 02/03/2024  Today's healthcare provider: LAURAINE LOISE BUOY, DO   Chief Complaint  Patient presents with   Medical Management of Chronic Issues    Follow-up T2DM   Subjective    HPI Justin Hammond is a 58 year old male with diabetes who presents for medication management and follow-up.  He is currently on Ozempic  2 mg, metformin, Jardiance, and glimepiride  for diabetes management. He is not regularly checking his blood sugars and occasionally feels tired, which he manages by sitting down if not working. He recently received a refill of Jardiance and has a sufficient supply of metformin. He continues to take glimepiride .  He has been prescribed vitamin D , which he takes once a week. He has not received the ketoconazole  cream for his left foot, which was supposed to be dispensed at [facility]. He uses lotion on the bottom of his feet to prevent cracking.  He is taking atorvastatin , which was prescribed by cardiology. He also takes gabapentin  for pain in his feet, which he describes as 'big pain here and there.' He takes one gabapentin  in the morning and half at night, which he feels controls the pain well. He experienced a transient pain in his finger that lasted about three days but resolved on its own.  He has not had an eye exam recently and plans to schedule one soon. He usually has an eye exam around October and last had one on August 9th of the previous year. He works for the [employer] community and gets his eye exams at [facility].  He received a flu vaccine. He is unsure if he had chickenpox as a child.  His left foot was previously hurting, but the pain has since resolved.  No nausea, vomiting, loose stools, or constipation. Occasional tiredness but no dizziness or lightheadedness.      Medications: Outpatient Medications Prior to Visit   Medication Sig   albuterol  (VENTOLIN  HFA) 108 (90 Base) MCG/ACT inhaler Inhale 2 puffs into the lungs every 6 (six) hours as needed for wheezing or shortness of breath.   aspirin  EC 81 MG tablet Take 81 mg by mouth daily. Swallow whole.   atorvastatin  (LIPITOR) 40 MG tablet Take 1 tablet (40 mg total) by mouth daily.   carvedilol  (COREG ) 3.125 MG tablet Take 1 tablet (3.125 mg total) by mouth 2 (two) times daily with a meal.   clotrimazole -betamethasone  (LOTRISONE ) cream Apply 1 Application topically 2 (two) times daily. Apply twice daily to head of the penis   gabapentin  (NEURONTIN ) 400 MG capsule Take 1 capsule by mouth 3 (three) times daily.   glimepiride  (AMARYL ) 4 MG tablet Take 2-4 mg by mouth See admin instructions. Take 4 mg by mouth in the morning and 2 mg at night   JARDIANCE 25 MG TABS tablet Take 25 mg by mouth every morning.   losartan  (COZAAR ) 100 MG tablet Take 1 tablet (100 mg total) by mouth daily.   metFORMIN (GLUCOPHAGE) 500 MG tablet Take 1,000 mg by mouth 2 (two) times daily.   omeprazole  (PRILOSEC ) 40 MG capsule Take 1 capsule (40 mg total) by mouth daily.   Semaglutide , 2 MG/DOSE, 8 MG/3ML SOPN Inject 2 mg into the skin once a week.   Vibegron  (GEMTESA ) 75 MG TABS Take 1 tablet (75 mg total) by mouth daily.   Vitamin D , Ergocalciferol , (DRISDOL )  1.25 MG (50000 UNIT) CAPS capsule Take 1 capsule (50,000 Units total) by mouth every 7 (seven) days.   [DISCONTINUED] ketoconazole  (NIZORAL ) 2 % cream Apply 1 Application topically daily. To right foot   docusate sodium  (COLACE) 100 MG capsule Take 1 capsule (100 mg total) by mouth 2 (two) times daily. (Patient taking differently: Take 100 mg by mouth 2 (two) times daily as needed.)   NON FORMULARY Pt uses a cpap nightly   [DISCONTINUED] benzonatate  (TESSALON ) 100 MG capsule Take 1 capsule (100 mg total) by mouth 2 (two) times daily as needed for cough.   No facility-administered medications prior to visit.        Objective     BP 129/60 (BP Location: Left Arm, Patient Position: Sitting, Cuff Size: Large)   Pulse (!) 57   Ht 5' 8 (1.727 m)   Wt 300 lb (136.1 kg)   SpO2 96%   BMI 45.61 kg/m     Physical Exam Vitals reviewed.  Constitutional:      General: He is not in acute distress.    Appearance: Normal appearance. He is not diaphoretic.  HENT:     Head: Normocephalic and atraumatic.  Eyes:     General: No scleral icterus.    Conjunctiva/sclera: Conjunctivae normal.  Cardiovascular:     Rate and Rhythm: Normal rate and regular rhythm.     Pulses: Normal pulses.     Heart sounds: Normal heart sounds. No murmur heard. Pulmonary:     Effort: Pulmonary effort is normal. No respiratory distress.     Breath sounds: Normal breath sounds. No wheezing or rhonchi.  Musculoskeletal:     Cervical back: Neck supple.     Right lower leg: No edema.     Left lower leg: No edema.  Lymphadenopathy:     Cervical: No cervical adenopathy.  Skin:    General: Skin is warm and dry.     Findings: No rash.  Neurological:     Mental Status: He is alert and oriented to person, place, and time. Mental status is at baseline.  Psychiatric:        Mood and Affect: Mood normal.        Behavior: Behavior normal.      Results for orders placed or performed in visit on 02/03/24  [facility] HEPATITIS C SCREENING LAB  Result Value Ref Range   HM Hepatitis Screen Negative-Validated     Assessment & Plan    Type 2 diabetes mellitus with hyperlipidemia (HCC)  Immunization due -     Flu vaccine trivalent PF, 6mos and older(Flulaval,Afluria,Fluarix,Fluzone)  Tinea pedis of left foot -     Ketoconazole ; Apply 1 Application topically daily. 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 Painful diabetic neuropathy managed with gabapentin . - Continue Ozempic , metformin, Jardiance, and glimepiride . - Continue gabapentin  for neuropathic pain. - Recheck blood work and A1c at next visit.  Tinea pedis, left foot Tinea pedis on the left foot. Previous prescription for ketoconazole  cream was not received. - Resend prescription for ketoconazole  cream to pharmacy.    Return in about 3 months (around 05/04/2024) for Chronic f/u.      I discussed the assessment and treatment plan with the patient  The patient was provided an  opportunity to ask questions and all were answered. The patient agreed with the plan and demonstrated an understanding of the instructions.   The patient was advised to call back or seek an in-person evaluation if the symptoms worsen or if the condition fails to improve as anticipated.    LAURAINE LOISE BUOY, DO  [facility] [phone] (phone) [phone] (fax)  [facility]

## 2024-02-14 LAB — OPHTHALMOLOGY REPORT-SCANNED

## 2024-02-16 ENCOUNTER — Other Ambulatory Visit: Payer: Self-pay | Admitting: Physician Assistant

## 2024-02-16 DIAGNOSIS — N401 Enlarged prostate with lower urinary tract symptoms: Secondary | ICD-10-CM

## 2024-03-05 ENCOUNTER — Ambulatory Visit: Payer: Self-pay

## 2024-03-05 ENCOUNTER — Ambulatory Visit: Admitting: Family Medicine

## 2024-03-05 NOTE — Telephone Encounter (Signed)
 FYI Only or Action Required?: FYI only for provider.  Patient was last seen in primary care on 02/03/2024 by Donzella Lauraine SAILOR, DO.  Called Nurse Triage reporting Facial Swelling swelling under right ear on jaw.  Symptoms began several days ago.  Interventions attempted: Nothing.  Symptoms are: gradually worsening.  Triage Disposition: See PCP When Office is Open (Within 3 Days)  Patient/caregiver understands and will follow disposition?: Yes - appt for this afternoon                  Copied from CRM #[id number]. Topic: Clinical - Red Word Triage >> Mar 05, 2024  8:17 AM Tobias L wrote: Red Word that prompted transfer to Nurse Triage: spot on jaw right under right ear, sore and swollen Reason for Disposition  [1] MILD face swelling (e.g., puffiness) AND [2] persists > 3 days  Answer Assessment - Initial Assessment Questions 1. ONSET: When did the swelling start? (e.g., minutes, hours, days)     Day or 2 ago 2. LOCATION: What part of the face is swollen? (e.g., cheek, entire face, jaw joint area, under jaw)     Under right ear 3. SEVERITY: How swollen is it?     People can see it 4. ITCHING: Is there any itching? If Yes, ask: How much?   (Scale 1-10; mild, moderate or severe)     no 5. PAIN: Is the swelling painful to touch? If Yes, ask: How painful is it?   (Scale 0-10; mild, moderate or severe)     Sore when he touches it. 6. FEVER: Do you have a fever? If Yes, ask: What is it, how was it measured, and when did it start?      no 7. CAUSE: What do you think is causing the face swelling?    Does not know 8. NEW MEDICINES: Have there been any new medicines started recently?     2-3 weeks - kidney doctor prescribed a new medication 9. RECURRENT SYMPTOM: Have you had face swelling before? If Yes, ask: When was the last time? What happened that time?     no 10. OTHER SYMPTOMS: Do you have any other symptoms? (e.g., leg swelling, toothache)        no  Protocols used: Face Swelling-A-AH

## 2024-03-06 ENCOUNTER — Ambulatory Visit: Admitting: Physician Assistant

## 2024-03-06 ENCOUNTER — Encounter: Payer: Self-pay | Admitting: Physician Assistant

## 2024-03-06 VITALS — BP 117/69 | HR 60 | Resp 16 | Ht 68.0 in | Wt 301.2 lb

## 2024-03-06 DIAGNOSIS — E1169 Type 2 diabetes mellitus with other specified complication: Secondary | ICD-10-CM | POA: Diagnosis not present

## 2024-03-06 DIAGNOSIS — E785 Hyperlipidemia, unspecified: Secondary | ICD-10-CM

## 2024-03-06 DIAGNOSIS — Z7984 Long term (current) use of oral hypoglycemic drugs: Secondary | ICD-10-CM

## 2024-03-06 DIAGNOSIS — H9201 Otalgia, right ear: Secondary | ICD-10-CM | POA: Diagnosis not present

## 2024-03-06 DIAGNOSIS — R220 Localized swelling, mass and lump, head: Secondary | ICD-10-CM

## 2024-03-06 DIAGNOSIS — Z7985 Long-term (current) use of injectable non-insulin antidiabetic drugs: Secondary | ICD-10-CM

## 2024-03-06 DIAGNOSIS — R0981 Nasal congestion: Secondary | ICD-10-CM

## 2024-03-06 MED ORDER — CEFDINIR 250 MG/5ML PO SUSR: 300.0000 mg | Freq: Two times a day (BID) | ORAL | 0 refills | Status: DC

## 2024-03-06 NOTE — Progress Notes (Signed)
 " Established patient visit  Patient: Justin Hammond   DOB: 10/24/1965   58 y.o. Male  MRN: [id number] Visit Date: 03/06/2024  Today's healthcare provider: Jolynn Spencer, PA-C   Chief Complaint  Patient presents with   Facial Swelling    Patient reports of swelling, pain on L side ear/face ongoing 2-3 days    Subjective     HPI     Facial Swelling    Additional comments: Patient reports of swelling, pain on L side ear/face ongoing 2-3 days       Last edited by Wilfred Hargis RAMAN, CMA on 03/06/2024 10:01 AM.       Discussed the use of AI scribe software for clinical note transcription with the patient, who gave verbal consent to proceed.  History of Present Illness Justin Hammond is a 58 year old male with diabetes and neuropathy who presents with ear pain and swelling.  He has experienced ear pain and swelling for two days, localized to the ear, with no history of similar episodes. There is no ear discharge, muffled hearing, fever, swallowing difficulties, or recent viral infections. He denies recent dental procedures.  He has diabetes and neuropathy, with numbness in his legs when sitting on high stools. He takes metformin, semaglutide , Jardiance, and Ozempic . His recent A1c was 9.1.  He experiences occasional eye itching and tearing without significant discharge. He uses Flomax  occasionally and is allergic to penicillin, which causes itching and facial scratching. He does not track water  intake but tries to drink water  throughout the day. He has seasonal colds but does not identify them as allergies.       02/03/2024    4:06 PM 12/25/2023    9:29 AM 12/16/2023    3:16 PM  Depression screen PHQ 2/9  Decreased Interest 1 0 1  Down, Depressed, Hopeless 0 0 0  PHQ - 2 Score 1 0 1  Altered sleeping 0 1 1  Tired, decreased energy 0 2 2  Change in appetite 2 0 1  Feeling bad or failure about yourself  0 0 0  Trouble concentrating 2 0 2  Moving slowly or  fidgety/restless 0 0 0  Suicidal thoughts 0 0 0  PHQ-9 Score 5 3 7   Difficult doing work/chores Not difficult at all Somewhat difficult Somewhat difficult      02/03/2024    4:07 PM 12/25/2023    9:29 AM 12/16/2023    3:16 PM  GAD 7 : Generalized Anxiety Score  Nervous, Anxious, on Edge 1 0 1  Control/stop worrying 1 0 1  Worry too much - different things 1 0 1  Trouble relaxing 1 0 1  Restless 0 0 1  Easily annoyed or irritable 1 0 1  Afraid - awful might happen 1 0 1  Total GAD 7 Score 6 0 7  Anxiety Difficulty Not difficult at all Not difficult at all Somewhat difficult    Medications: Outpatient Medications Prior to Visit  Medication Sig   albuterol  (VENTOLIN  HFA) 108 (90 Base) MCG/ACT inhaler Inhale 2 puffs into the lungs every 6 (six) hours as needed for wheezing or shortness of breath.   aspirin  EC 81 MG tablet Take 81 mg by mouth daily. Swallow whole.   atorvastatin  (LIPITOR) 40 MG tablet Take 1 tablet (40 mg total) by mouth daily.   carvedilol  (COREG ) 3.125 MG tablet Take 1 tablet (3.125 mg total) by mouth 2 (two) times daily with a meal.   clotrimazole -betamethasone  (LOTRISONE ) cream Apply  1 Application topically 2 (two) times daily. Apply twice daily to head of the penis   docusate sodium  (COLACE) 100 MG capsule Take 1 capsule (100 mg total) by mouth 2 (two) times daily. (Patient taking differently: Take 100 mg by mouth 2 (two) times daily as needed.)   gabapentin  (NEURONTIN ) 400 MG capsule Take 1 capsule by mouth 3 (three) times daily.   GEMTESA  75 MG TABS Take 1 tablet by mouth once daily   glimepiride  (AMARYL ) 4 MG tablet Take 2-4 mg by mouth See admin instructions. Take 4 mg by mouth in the morning and 2 mg at night   JARDIANCE 25 MG TABS tablet Take 25 mg by mouth every morning.   ketoconazole  (NIZORAL ) 2 % cream Apply 1 Application topically daily. To right foot   losartan  (COZAAR ) 100 MG tablet Take 1 tablet (100 mg total) by mouth daily.   metFORMIN (GLUCOPHAGE)  500 MG tablet Take 1,000 mg by mouth 2 (two) times daily.   NON FORMULARY Pt uses a cpap nightly   omeprazole  (PRILOSEC ) 40 MG capsule Take 1 capsule (40 mg total) by mouth daily.   Semaglutide , 2 MG/DOSE, 8 MG/3ML SOPN Inject 2 mg into the skin once a week.   Vitamin D , Ergocalciferol , (DRISDOL ) 1.25 MG (50000 UNIT) CAPS capsule Take 1 capsule (50,000 Units total) by mouth every 7 (seven) days.   No facility-administered medications prior to visit.    Review of Systems All negative Except see HPI       Objective    BP 117/69   Pulse 60   Resp 16   Ht 5' 8 (1.727 m)   Wt (!) 301 lb 3.2 oz (136.6 kg)   SpO2 96%   BMI 45.80 kg/m     Physical Exam Vitals reviewed.  Constitutional:      General: He is in acute distress.     Appearance: Normal appearance. He is not ill-appearing, toxic-appearing or diaphoretic.  HENT:     Head: Normocephalic and atraumatic.     Right Ear: Tympanic membrane, ear canal and external ear normal.     Left Ear: Tympanic membrane, ear canal and external ear normal.     Nose: Congestion and rhinorrhea present.     Mouth/Throat:     Pharynx: Posterior oropharyngeal erythema present.  Eyes:     General: No scleral icterus.       Right eye: No discharge.        Left eye: No discharge.     Extraocular Movements: Extraocular movements intact.     Conjunctiva/sclera: Conjunctivae normal.     Pupils: Pupils are equal, round, and reactive to light.  Cardiovascular:     Rate and Rhythm: Normal rate and regular rhythm.     Pulses: Normal pulses.     Heart sounds: Normal heart sounds. No murmur heard. Pulmonary:     Effort: Pulmonary effort is normal. No respiratory distress.     Breath sounds: Normal breath sounds. No wheezing or rhonchi.  Abdominal:     General: Abdomen is flat. Bowel sounds are normal.     Palpations: Abdomen is soft.  Musculoskeletal:        General: Normal range of motion.     Cervical back: Normal range of motion and neck  supple.     Right lower leg: No edema.     Left lower leg: No edema.  Lymphadenopathy:     Cervical: No cervical adenopathy.  Skin:    General: Skin is  warm and dry.     Findings: No rash.  Neurological:     General: No focal deficit present.     Mental Status: He is alert and oriented to person, place, and time. Mental status is at baseline.  Psychiatric:        Behavior: Behavior normal.        Thought Content: Thought content normal.      No results found for any visits on 03/06/24.      Assessment & Plan Acute right facial swelling and pain (possible infection) Around the right ear. Could be due to infection. Known penicillin allergy complicates antibiotic selection. Previous azithromycin  use noted. Current plan is to try cefdinir , a cephalosporin, despite potential cross-reactivity with penicillin. Informed consent obtained for trial of cefdinir  with instructions to monitor for allergic reaction. - Prescribe cefdinir  suspension, starting with a small dose to monitor for allergic reaction, then proceed with full dose if tolerated. - Instruct to report any adverse reactions immediately. - Advise to drink plenty of water .  Nasal congestion Nasal congestion possibly related to seasonal changes or allergies. - Recommend nasal saline spray for nasal congestion. - Advise use of Flonase twice daily. - Suggest taking an antihistamine like Zyrtec.  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 Recent A1c elevated at 9.1. Concern about impact of steroids on blood sugar control, hence avoidance of prednisone. - Monitor blood sugar levels closely. - Continue current diabetes medications: metformin 1000 daily, semaglutide  2mg  , Jardiance 25, and Ozempic .  Obstructive sleep apnea Obstructive sleep apnea contributing to a crowded oropharyngeal space. On cpap  No orders of the defined types were placed in this  encounter.   No follow-ups on file.   The patient was advised to call back or seek an in-person evaluation if the symptoms worsen or if the condition fails to improve as anticipated.  I discussed the assessment and treatment plan with the patient. The patient was provided an opportunity to ask questions and all were answered. The patient agreed with the plan and demonstrated an understanding of the instructions.  I, Laynie Espy, PA-C have reviewed all documentation for this visit. The documentation on 03/06/2024  for the exam, diagnosis, procedures, and orders are all accurate and complete.  Jolynn Spencer, [facility], MMS [facility] [phone] (phone) [phone] (fax)  [facility] "

## 2024-03-14 ENCOUNTER — Other Ambulatory Visit: Payer: Self-pay | Admitting: Physician Assistant

## 2024-03-14 DIAGNOSIS — N401 Enlarged prostate with lower urinary tract symptoms: Secondary | ICD-10-CM

## 2024-03-19 ENCOUNTER — Encounter: Payer: Self-pay | Admitting: Family Medicine

## 2024-03-19 DIAGNOSIS — E13319 Other specified diabetes mellitus with unspecified diabetic retinopathy without macular edema: Secondary | ICD-10-CM | POA: Insufficient documentation

## 2024-04-29 ENCOUNTER — Other Ambulatory Visit: Payer: Self-pay | Admitting: Family Medicine

## 2024-04-29 DIAGNOSIS — N1832 Chronic kidney disease, stage 3b: Secondary | ICD-10-CM

## 2024-04-30 LAB — LIPID PANEL
Chol/HDL Ratio: 4.5 ratio (ref 0.0–5.0)
Cholesterol, Total: 134 mg/dL (ref 100–199)
HDL: 30 mg/dL — ABNORMAL LOW (ref >39)
LDL Chol Calc (NIH): 45 mg/dL (ref 0–99)
Triglycerides: 402 mg/dL — ABNORMAL HIGH (ref 0–149)
VLDL Cholesterol Cal: 59 mg/dL — ABNORMAL HIGH (ref 5–40)

## 2024-04-30 LAB — COMPREHENSIVE METABOLIC PANEL WITH GFR
ALT: 38 IU/L (ref 0–44)
AST: 33 IU/L (ref 0–40)
Albumin: 4.1 g/dL (ref 3.8–4.9)
Alkaline Phosphatase: 148 IU/L — ABNORMAL HIGH (ref 47–123)
BUN/Creatinine Ratio: 15 (ref 10–24)
BUN: 26 mg/dL (ref 8–27)
Bilirubin Total: 0.4 mg/dL (ref 0.0–1.2)
CO2: 20 mmol/L (ref 20–29)
Calcium: 9.3 mg/dL (ref 8.6–10.2)
Chloride: 101 mmol/L (ref 96–106)
Creatinine, Ser: 1.69 mg/dL — ABNORMAL HIGH (ref 0.76–1.27)
Globulin, Total: 2.6 g/dL (ref 1.5–4.5)
Glucose: 88 mg/dL (ref 70–99)
Potassium: 5.1 mmol/L (ref 3.5–5.2)
Sodium: 137 mmol/L (ref 134–144)
Total Protein: 6.7 g/dL (ref 6.0–8.5)
eGFR: 46 mL/min/1.73 — ABNORMAL LOW (ref >59)

## 2024-04-30 LAB — HEMOGLOBIN A1C
Est. average glucose Bld gHb Est-mCnc: 200 mg/dL
Hgb A1c MFr Bld: 8.6 % — ABNORMAL HIGH (ref 4.8–5.6)

## 2024-04-30 LAB — VITAMIN B12: Vitamin B-12: 409 pg/mL (ref 232–1245)

## 2024-04-30 LAB — VITAMIN D 25 HYDROXY (VIT D DEFICIENCY, FRACTURES): Vit D, 25-Hydroxy: 28.4 ng/mL — ABNORMAL LOW (ref 30.0–100.0)

## 2024-05-05 ENCOUNTER — Ambulatory Visit: Admitting: Family Medicine

## 2024-05-05 ENCOUNTER — Encounter: Payer: Self-pay | Admitting: Family Medicine

## 2024-05-05 VITALS — BP 125/71 | HR 67 | Temp 98.2°F | Ht 68.0 in | Wt 301.4 lb

## 2024-05-05 DIAGNOSIS — Z7984 Long term (current) use of oral hypoglycemic drugs: Secondary | ICD-10-CM | POA: Diagnosis not present

## 2024-05-05 DIAGNOSIS — Z7985 Long-term (current) use of injectable non-insulin antidiabetic drugs: Secondary | ICD-10-CM

## 2024-05-05 DIAGNOSIS — R591 Generalized enlarged lymph nodes: Secondary | ICD-10-CM

## 2024-05-05 DIAGNOSIS — E1122 Type 2 diabetes mellitus with diabetic chronic kidney disease: Secondary | ICD-10-CM | POA: Diagnosis not present

## 2024-05-05 DIAGNOSIS — R1310 Dysphagia, unspecified: Secondary | ICD-10-CM

## 2024-05-05 DIAGNOSIS — N1832 Chronic kidney disease, stage 3b: Secondary | ICD-10-CM | POA: Diagnosis not present

## 2024-05-05 NOTE — Progress Notes (Signed)
 "     Established patient visit   Patient: Justin Hammond   DOB: 1965/09/20   58 y.o. Male  MRN: [id number] Visit Date: 05/05/2024  Today's healthcare provider: LAURAINE LOISE BUOY, DO   Chief Complaint  Patient presents with   Medical Management of Chronic Issues    Patient is here for a 3 month follow up, reports that he has a sore throat and has had it for a while.  Seems like it moves from one side to his mouth to the other.  Sometimes he tends to have muscle spasms and he can rub it then it tends to go away.   Subjective    HPI Justin Hammond is a 59 year old male who presents with a sore throat and swallowing difficulties.  He has been experiencing a sore throat for about a month, with pain that sometimes shifts from one side to the other and occasionally feels centralized. The pain is persistent throughout the day and is especially noticeable when swallowing or moving his throat. The severity of the sore throat fluctuates, sometimes improving and then worsening again.  He reports changes in his voice and trouble swallowing over the past month. His voice sometimes changes, and swallowing can be painful, particularly when there is pressure in the throat. He describes episodes where swallowing is difficult, leading to coughing, especially when drinking water  or eating certain foods like eggs and gravy.  He experiences headaches and occasional muscle spasms in his neck area. He also reports feeling tired quickly and needing to take breaks often. No chest pain, shortness of breath, but he mentions occasional dizziness and lightheadedness.  He has a history of kidney issues and reports that his kidney doctor told him his kidney function was about 45%. He mentions seeing a kidney doctor recently and undergoing blood work and a genetic test, although there was confusion about the results and procedures.  He is currently taking glimepiride , Jardiance, metformin, and Ozempic  for his  diabetes, although he sometimes forgets to take his medications. He also takes a vitamin, possibly vitamin D , once a week. He recently switched from Kerendia to spironolactone due to cost concerns.  He has not received the shingles vaccine and reports being somewhat depressed, lacking energy and inclination to do things, although he does not wish to see a psychologist or therapist.      Medications: Show/hide medication list[1]       Objective    BP 125/71 (BP Location: Right Arm, Patient Position: Sitting, Cuff Size: Large)   Pulse 67   Temp 98.2 F (36.8 C) (Oral)   Ht 5' 8 (1.727 m)   Wt (!) 301 lb 6.4 oz (136.7 kg)   SpO2 97%   BMI 45.83 kg/m     Physical Exam Vitals and nursing note reviewed.  Constitutional:      General: He is not in acute distress.    Appearance: Normal appearance.  HENT:     Head: Normocephalic and atraumatic.     Right Ear: Tympanic membrane, ear canal and external ear normal. There is no impacted cerumen.     Left Ear: Tympanic membrane, ear canal and external ear normal. There is no impacted cerumen.     Nose: Nose normal. No congestion or rhinorrhea.     Mouth/Throat:     Mouth: Mucous membranes are moist.     Pharynx: Oropharynx is clear. No oropharyngeal exudate or posterior oropharyngeal erythema.  Eyes:     General:  No scleral icterus.    Conjunctiva/sclera: Conjunctivae normal.  Cardiovascular:     Rate and Rhythm: Normal rate.  Pulmonary:     Effort: Pulmonary effort is normal.  Neurological:     Mental Status: He is alert and oriented to person, place, and time. Mental status is at baseline.  Psychiatric:        Mood and Affect: Mood normal.        Behavior: Behavior normal.      No results found for any visits on 05/05/24.  Assessment & Plan    Dysphagia, unspecified type -     Ambulatory referral to Gastroenterology  Lymphadenopathy -     US  SOFT TISSUE HEAD & NECK (NON-THYROID ); Future  Type 2 diabetes mellitus  with stage 3b chronic kidney disease, without long-term current use of insulin  (HCC)      Dysphagia, unspecified type Intermittent dysphagia and sore throat suggest non-infectious etiology. Differential includes esophageal or throat pathology. - Ordered neck ultrasound to evaluate for abnormalities. - Referred to gastroenterologist for swallowing evaluation.  Lymphadenopathy Neck discomfort with tenderness, no significant throat findings.  Ordered ultrasound as noted above  Type 2 diabetes mellitus with stage 3b chronic kidney disease, without long-term current use of insulin  Managed with glimepiride , Jardiance, metformin, and Ozempic .  Occasional missed doses reported.  Recent kidney function 45%.  Recent medication transition from Kerendia to spironolactone due to cost. - Encouraged setting phone alarms for consistent medication adherence. - Follows with nephrology for CKD; defer to specialist management  General health maintenance Has not received shingles vaccine, declined today due to health status. - Discussed shingles vaccine importance, encouraged vaccination when health improves.    Return in about 3 months (around 08/03/2024) for DM, HTN, and in 6 months with next provider.      I discussed the assessment and treatment plan with the patient  The patient was provided an opportunity to ask questions and all were answered. The patient agreed with the plan and demonstrated an understanding of the instructions.   The patient was advised to call back or seek an in-person evaluation if the symptoms worsen or if the condition fails to improve as anticipated.    LAURAINE LOISE BUOY, DO  [facility] [phone] (phone) [phone] (fax)  [facility]     [1]  Outpatient Medications Prior to Visit  Medication Sig   albuterol  (VENTOLIN  HFA) 108 (90 Base) MCG/ACT inhaler Inhale 2 puffs into the lungs every 6 (six) hours as needed for  wheezing or shortness of breath.   aspirin  EC 81 MG tablet Take 81 mg by mouth daily. Swallow whole.   atorvastatin  (LIPITOR) 40 MG tablet Take 1 tablet (40 mg total) by mouth daily.   carvedilol  (COREG ) 3.125 MG tablet Take 1 tablet (3.125 mg total) by mouth 2 (two) times daily with a meal.   clotrimazole -betamethasone  (LOTRISONE ) cream Apply 1 Application topically 2 (two) times daily. Apply twice daily to head of the penis   docusate sodium  (COLACE) 100 MG capsule Take 1 capsule (100 mg total) by mouth 2 (two) times daily. (Patient taking differently: Take 100 mg by mouth 2 (two) times daily as needed.)   gabapentin  (NEURONTIN ) 400 MG capsule Take 1 capsule by mouth 3 (three) times daily.   glimepiride  (AMARYL ) 4 MG tablet Take 2-4 mg by mouth See admin instructions. Take 4 mg by mouth in the morning and 2 mg at night   JARDIANCE 25 MG TABS tablet Take  25 mg by mouth every morning.   ketoconazole  (NIZORAL ) 2 % cream Apply 1 Application topically daily. To right foot   losartan  (COZAAR ) 100 MG tablet Take 1 tablet (100 mg total) by mouth daily.   metFORMIN (GLUCOPHAGE) 500 MG tablet Take 1,000 mg by mouth 2 (two) times daily.   NON FORMULARY Pt uses a cpap nightly   omeprazole  (PRILOSEC ) 40 MG capsule Take 1 capsule (40 mg total) by mouth daily.   Semaglutide , 2 MG/DOSE, 8 MG/3ML SOPN Inject 2 mg into the skin once a week.   Vibegron  (GEMTESA ) 75 MG TABS Take 1 tablet by mouth once daily   [DISCONTINUED] cefdinir  (OMNICEF ) 250 MG/5ML suspension Take 6 mLs (300 mg total) by mouth 2 (two) times daily.   [DISCONTINUED] Vitamin D , Ergocalciferol , (DRISDOL ) 1.25 MG (50000 UNIT) CAPS capsule Take 1 capsule (50,000 Units total) by mouth every 7 (seven) days.   No facility-administered medications prior to visit.   "

## 2024-05-08 ENCOUNTER — Ambulatory Visit
Admission: RE | Admit: 2024-05-08 | Discharge: 2024-05-08 | Disposition: A | Discharge status: Home / Self Care | Source: Ambulatory Visit | Attending: Family Medicine | Admitting: Family Medicine

## 2024-05-08 DIAGNOSIS — R591 Generalized enlarged lymph nodes: Secondary | ICD-10-CM | POA: Insufficient documentation

## 2024-05-11 ENCOUNTER — Ambulatory Visit: Payer: Self-pay | Admitting: Family Medicine

## 2024-05-18 ENCOUNTER — Ambulatory Visit: Payer: Self-pay | Admitting: Family Medicine

## 2024-05-18 DIAGNOSIS — E559 Vitamin D deficiency, unspecified: Secondary | ICD-10-CM

## 2024-05-18 DIAGNOSIS — N1832 Chronic kidney disease, stage 3b: Secondary | ICD-10-CM

## 2024-05-18 MED ORDER — VITAMIN D (ERGOCALCIFEROL) 1.25 MG (50000 UNIT) PO CAPS: 50000.0000 [IU] | ORAL_CAPSULE | ORAL | 1 refills | Status: AC

## 2024-05-19 ENCOUNTER — Encounter: Payer: Self-pay | Admitting: Family Medicine

## 2024-05-19 NOTE — Telephone Encounter (Unsigned)
 Copied from CRM [id number]. Topic: Clinical - Lab/Test Results >> May 19, 2024 11:04 AM [place] J wrote: Reason for CRM: Lab results have been successfully relayed to the patient. He has no questions at this time.

## 2024-06-12 ENCOUNTER — Encounter: Payer: Self-pay | Admitting: Emergency Medicine

## 2024-06-12 ENCOUNTER — Ambulatory Visit
Admission: EM | Admit: 2024-06-12 | Discharge: 2024-06-12 | Disposition: A | Discharge status: Home / Self Care | Attending: Emergency Medicine | Admitting: Emergency Medicine

## 2024-06-12 DIAGNOSIS — J069 Acute upper respiratory infection, unspecified: Secondary | ICD-10-CM | POA: Diagnosis not present

## 2024-06-12 MED ORDER — AZITHROMYCIN 250 MG PO TABS: 250.0000 mg | ORAL_TABLET | Freq: Every day | ORAL | 0 refills | Status: DC

## 2024-06-12 NOTE — ED Triage Notes (Signed)
 Pt presents with a productive cough, runny nose, chills, dizziness, sore throat and diarrhea x 5 days. [facility]. He was prescribed flonase and nasal spray. Pt states his symptoms started while [place] was [place] the beach.

## 2024-06-12 NOTE — Discharge Instructions (Addendum)
 Take the Zithromax as directed.  Follow up with your primary care provider.  Go to the emergency department if you have worsening symptoms.

## 2024-06-12 NOTE — ED Provider Notes (Signed)
 " CAY RALPH PELT    CSN: [id number] Arrival date & time: 06/12/24  1143      History   Chief Complaint Chief Complaint  Patient presents with   Chills   Dizziness   Diarrhea   Nasal Congestion   Cough   Sore Throat    HPI AHMADOU BOLZ is a 59 y.o. male.  Patient presents with 1 week history of chills, dizziness, congestion, runny nose, sore throat, cough, diarrhea.  No fever, shortness of breath, vomiting.  He has been treating his symptoms with Flonase nasal spray that was prescribed at a minute clinic at [facility]  His medical history includes hypertension, NSTEMI, heart failure, diabetes, kidney cancer, left nephrectomy, morbid obesity.  The history is provided by the patient and medical records.    Past Medical History:  Diagnosis Date   Arthritis    Bladder cancer (HCC) 2007   BPH (benign prostatic hyperplasia)    CHF (congestive heart failure) (HCC)    Cholelithiasis    Coronary artery disease 12/28/2016   a.) LHC/PCI 12/28/2016: 100% mRCA with faint collaterals from LEFT, 99% o-pLAD (4.50 x 15 mm Resolute Onyx DES); b.) MV 12/29/2019: no ischemia; c.) MV 01/22/2023: no ischemia   Essential hypertension    GERD (gastroesophageal reflux disease)    History of 2019 novel coronavirus disease (COVID-19) 05/27/2019   History of echocardiogram 12/24/2019   a.) TTE 12/24/2019: EF 60-65%, no RWMAs, normal RVSF, no valvulopathies   Hyperlipidemia    Long-term use of aspirin  therapy    Morbid obesity with BMI of 40.0-44.9, adult (HCC)    Neuropathy of both feet    NSTEMI (non-ST elevated myocardial infarction) (HCC) 12/27/2016   a.) troponins trended: 0.21 --> 0.13 --> 0.21 --> 0.21 ng/mL; b.) LHC 12/28/2016: 99% o-pLAD (4.5 x 15 mm Resolute Onyx DES)   OSA on CPAP    Proteinuria    Renal cell carcinoma, left (HCC) 01/2023   a.) s/p LEFT nephrectomy, adrenalectomy, and retroperitoneal lymph node dissection 01/2023   Sleep apnea    Type 2 diabetes  mellitus with hyperlipidemia [facility])     Patient Active Problem List   Diagnosis Date Noted   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   History of renal cell carcinoma 12/16/2023   Renal mass 01/30/2023   Norovirus 12/07/2022   Campylobacter diarrhea 12/07/2022   Left kidney mass 12/07/2022   Obesity, Class III, BMI 40-49.9 (morbid obesity) (HCC) 12/07/2022   Elevated LFTs 12/07/2022   Hyponatremia 12/07/2022   BPH (benign prostatic hyperplasia) 12/07/2022   Gram-negative bacteremia 12/07/2022   Type 2 diabetes mellitus with hyperlipidemia (HCC) 12/07/2022   Neck ache 05/31/2020   COVID-19 vaccine administered 05/31/2020   Hyperlipidemia    Pain due to onychomycosis of toenails of both feet 10/26/2019   Blood clotting disorder 10/26/2019   Diabetic neuropathy (HCC) 10/26/2019   OSA (obstructive sleep apnea) 02/25/2017   Coronary artery disease involving native coronary artery of native heart 01/03/2017   NSTEMI (non-ST elevated myocardial infarction) (HCC) 12/27/2016   Essential hypertension 12/24/2016    Past Surgical History:  Procedure Laterality Date   APPENDECTOMY  1978   CORONARY STENT INTERVENTION N/A 12/28/2016   Procedure: CORONARY STENT INTERVENTION;  Surgeon: Bosie Vinie LABOR, MD;  Location: [facility] INVASIVE CV LAB;  Service: Cardiovascular;  Laterality: N/A;   CORONARY STENT INTERVENTION N/A 12/28/2016   Procedure: CORONARY STENT INTERVENTION;  Surgeon: Ammon Blunt, MD;  Location: [facility] INVASIVE CV LAB;  Service:  Cardiovascular;  Laterality: N/A;   HOLEP-LASER ENUCLEATION OF THE PROSTATE WITH MORCELLATION N/A 03/29/2023   Procedure: HOLEP-LASER ENUCLEATION OF THE PROSTATE WITH MORCELLATION;  Surgeon: Francisca Redell BROCKS, MD;  Location: [facility] ORS;  Service: Urology;  Laterality: N/A;   LEFT HEART CATH AND CORONARY ANGIOGRAPHY N/A 12/28/2016   Procedure: LEFT HEART CATH AND CORONARY ANGIOGRAPHY;  Surgeon: Bosie Vinie LABOR, MD;  Location: [facility]  INVASIVE CV LAB;  Service: Cardiovascular;  Laterality: N/A;   PROSTATE SURGERY     ROBOT ASSISTED LAPAROSCOPIC NEPHRECTOMY Left 01/30/2023   Procedure: XI ROBOTIC ASSISTED LEFT LAPAROSCOPIC RADICAL NEPHRECTOMY AND RETROPERITONEAL NODE DISSECTION;  Surgeon: Alvaro Ricardo KATHEE Mickey., MD;  Location: [facility] ORS;  Service: Urology;  Laterality: Left;  180 MINUTES   TRANSURETHRAL MICROWAVE THERAPY     2009, 2018       Home Medications    Prior to Admission medications  Medication Sig Start Date End Date Taking? Authorizing Provider  azithromycin  (ZITHROMAX ) 250 MG tablet Take 1 tablet (250 mg total) by mouth daily. Take first 2 tablets together, then 1 every day until finished. 06/12/24  Yes Corlis Burnard DEL, NP  albuterol  (VENTOLIN  HFA) 108 (90 Base) MCG/ACT inhaler Inhale 2 puffs into the lungs every 6 (six) hours as needed for wheezing or shortness of breath. 12/25/23   Donzella Lauraine SAILOR, DO  aspirin  EC 81 MG tablet Take 81 mg by mouth daily. Swallow whole.    [provider]  atorvastatin  (LIPITOR) 40 MG tablet Take 1 tablet (40 mg total) by mouth daily. 12/29/16   Vachhani, Vaibhavkumar, MD  carvedilol  (COREG ) 3.125 MG tablet Take 1 tablet (3.125 mg total) by mouth 2 (two) times daily with a meal. 12/29/16   Vachhani, Vaibhavkumar, MD  clotrimazole -betamethasone  (LOTRISONE ) cream Apply 1 Application topically 2 (two) times daily. Apply twice daily to head of the penis 03/29/23   Francisca Redell BROCKS, MD  docusate sodium  (COLACE) 100 MG capsule Take 1 capsule (100 mg total) by mouth 2 (two) times daily. Patient taking differently: Take 100 mg by mouth 2 (two) times daily as needed. 01/30/23   Cory Palma, PA-C  gabapentin  (NEURONTIN ) 400 MG capsule Take 1 capsule by mouth 3 (three) times daily. 10/21/16   [provider]  glimepiride  (AMARYL ) 4 MG tablet Take 2-4 mg by mouth See admin instructions. Take 4 mg by mouth in the morning and 2 mg at night 01/16/23   [provider]  JARDIANCE 25  MG TABS tablet Take 25 mg by mouth every morning. 10/05/19   [provider]  ketoconazole  (NIZORAL ) 2 % cream Apply 1 Application topically daily. To right foot 02/03/24   Donzella Lauraine SAILOR, DO  losartan  (COZAAR ) 100 MG tablet Take 1 tablet (100 mg total) by mouth daily. 12/16/23   Donzella Lauraine SAILOR, DO  metFORMIN (GLUCOPHAGE) 500 MG tablet Take 1,000 mg by mouth 2 (two) times daily. 12/19/22   [provider]  NON FORMULARY Pt uses a cpap nightly    [provider]  omeprazole  (PRILOSEC ) 40 MG capsule Take 1 capsule (40 mg total) by mouth daily. 12/16/23   Donzella Lauraine SAILOR, DO  Semaglutide , 2 MG/DOSE, 8 MG/3ML SOPN Inject 2 mg into the skin once a week. 12/16/23   Donzella Lauraine SAILOR, DO  Vibegron  (GEMTESA ) 75 MG TABS Take 1 tablet by mouth once daily 03/16/24   Vaillancourt, Samantha, PA-C  Vitamin D , Ergocalciferol , (DRISDOL ) 1.25 MG (50000 UNIT) CAPS capsule Take 1 capsule (50,000 Units total) by mouth every  7 (seven) days. 05/18/24   Donzella Lauraine SAILOR, DO    Family History Family History  Problem Relation Age of Onset   Breast cancer Mother    CAD Father     Social History Social History[1]   Allergies   Penicillins   Review of Systems Review of Systems  Constitutional:  Positive for chills. Negative for fever.  HENT:  Positive for congestion, rhinorrhea and sore throat. Negative for ear pain.   Respiratory:  Positive for cough. Negative for shortness of breath.   Cardiovascular:  Negative for chest pain and palpitations.  Gastrointestinal:  Positive for diarrhea. Negative for abdominal pain and vomiting.  Neurological:  Positive for dizziness.     Physical Exam Triage Vital Signs ED Triage Vitals  Encounter Vitals Group     BP 06/12/24 1330 117/70     Girls Systolic BP Percentile --      Girls Diastolic BP Percentile --      Boys Systolic BP Percentile --      Boys Diastolic BP Percentile --      Pulse Rate 06/12/24 1330 63     Resp 06/12/24 1330 18      Temp 06/12/24 1330 98.2 F (36.8 C)     Temp src --      SpO2 06/12/24 1330 97 %     Weight 06/12/24 1342 300 lb (136.1 kg)     Height --      Head Circumference --      Peak Flow --      Pain Score 06/12/24 1342 5     Pain Loc --      Pain Education --      Exclude from Growth Chart --    No data found.  Updated Vital Signs BP 117/70   Pulse 63   Temp 98.2 F (36.8 C)   Resp 18   Wt 300 lb (136.1 kg)   SpO2 97%   BMI 45.61 kg/m   Visual Acuity Right Eye Distance:   Left Eye Distance:   Bilateral Distance:    Right Eye Near:   Left Eye Near:    Bilateral Near:     Physical Exam Constitutional:      General: He is not in acute distress.    Appearance: He is obese.  HENT:     Right Ear: Tympanic membrane normal.     Left Ear: Tympanic membrane normal.     Nose: Congestion and rhinorrhea present.     Mouth/Throat:     Mouth: Mucous membranes are moist.     Pharynx: Oropharynx is clear.  Cardiovascular:     Rate and Rhythm: Normal rate and regular rhythm.     Heart sounds: Normal heart sounds.  Pulmonary:     Effort: Pulmonary effort is normal. No respiratory distress.     Breath sounds: Normal breath sounds.  Neurological:     Mental Status: He is alert.      UC Treatments / Results  Labs (all labs ordered are listed, but only abnormal results are displayed) Labs Reviewed - No data to display  EKG   Radiology No results found.  Procedures Procedures (including critical care time)  Medications Ordered in UC Medications - No data to display  Initial Impression / Assessment and Plan / UC Course  I have reviewed the triage vital signs and the nursing notes.  Pertinent labs & imaging results that were available during my care of the patient were reviewed by  me and considered in my medical decision making (see chart for details).    Acute upper respiratory infection.  Afebrile and vital signs are stable.  Lungs are clear and O2 sat is 97% on  room air.  Treating today with Zithromax .  Instructed patient to follow-up with his PCP.  ED precautions given.  Education provided on upper respiratory infection.  He agrees to plan of care.  Final Clinical Impressions(s) / UC Diagnoses   Final diagnoses:  Acute upper respiratory infection     Discharge Instructions      Take the Zithromax  as directed.  Follow up with your primary care provider.  Go to the emergency department if you have worsening symptoms.        ED Prescriptions     Medication Sig Dispense Auth. Provider   azithromycin  (ZITHROMAX ) 250 MG tablet Take 1 tablet (250 mg total) by mouth daily. Take first 2 tablets together, then 1 every day until finished. 6 tablet Corlis Burnard DEL, NP      PDMP not reviewed this encounter.    [1]  Social History Tobacco Use   Smoking status: Never   Smokeless tobacco: Never  Vaping Use   Vaping status: Never Used  Substance Use Topics   Alcohol use: No   Drug use: Never     Corlis Burnard DEL, NP 06/12/24 1401  "

## 2024-06-15 ENCOUNTER — Ambulatory Visit: Admitting: Family Medicine

## 2024-06-16 ENCOUNTER — Encounter: Payer: Self-pay | Admitting: Physician Assistant

## 2024-06-16 ENCOUNTER — Ambulatory Visit: Admitting: Physician Assistant

## 2024-06-16 VITALS — BP 116/66 | HR 59 | Wt 293.0 lb

## 2024-06-16 DIAGNOSIS — G4489 Other headache syndrome: Secondary | ICD-10-CM | POA: Diagnosis not present

## 2024-06-16 DIAGNOSIS — R0602 Shortness of breath: Secondary | ICD-10-CM | POA: Diagnosis not present

## 2024-06-16 DIAGNOSIS — R0981 Nasal congestion: Secondary | ICD-10-CM

## 2024-06-16 DIAGNOSIS — E782 Mixed hyperlipidemia: Secondary | ICD-10-CM

## 2024-06-16 DIAGNOSIS — N1832 Chronic kidney disease, stage 3b: Secondary | ICD-10-CM

## 2024-06-16 DIAGNOSIS — I10 Essential (primary) hypertension: Secondary | ICD-10-CM | POA: Diagnosis not present

## 2024-06-16 DIAGNOSIS — R058 Other specified cough: Secondary | ICD-10-CM | POA: Diagnosis not present

## 2024-06-16 DIAGNOSIS — I252 Old myocardial infarction: Secondary | ICD-10-CM

## 2024-06-16 DIAGNOSIS — E1122 Type 2 diabetes mellitus with diabetic chronic kidney disease: Secondary | ICD-10-CM | POA: Diagnosis not present

## 2024-06-16 DIAGNOSIS — R42 Dizziness and giddiness: Secondary | ICD-10-CM | POA: Diagnosis not present

## 2024-06-16 DIAGNOSIS — J0181 Other acute recurrent sinusitis: Secondary | ICD-10-CM | POA: Diagnosis not present

## 2024-06-16 DIAGNOSIS — R232 Flushing: Secondary | ICD-10-CM

## 2024-06-16 MED ORDER — HYDROXYZINE HCL 10 MG PO TABS: 10.0000 mg | ORAL_TABLET | Freq: Three times a day (TID) | ORAL | 0 refills | Status: DC | PRN

## 2024-06-16 NOTE — Progress Notes (Unsigned)
 " Established patient visit  Patient: Justin Hammond   DOB: [birth year]   59 y.o. Male  MRN: [id number] Visit Date: 06/16/2024  Today's healthcare provider: Jolynn Spencer, PA-C   Chief Complaint  Patient presents with   Dizziness    Associated symptoms: Headache and hot flashes, cold chills, sinus pressure. Nasal congestion, fatigue. Frequency: 2 weeks Treatments: Fluids Patient reports that he went to a minute clinic and was treated with a Flonase at [facility] and went again to [facility] 06/12/24 and was given Azithromycin . 191/158  today and 226/156 last night.   Subjective     HPI     Dizziness    Additional comments: Associated symptoms: Headache and hot flashes, cold chills, sinus pressure. Nasal congestion, fatigue. Frequency: 2 weeks Treatments: Fluids Patient reports that he went to a minute clinic and was treated with a Flonase at [facility] and went again to [facility] 06/12/24 and was given Azithromycin . 191/158  today and 226/156 last night.      Last edited by Rosas, Joseline E, CMA on 06/16/2024  1:54 PM.       Discussed the use of AI scribe software for clinical note transcription with the patient, who gave verbal consent to proceed.  History of Present Illness        06/16/2024    1:55 PM 05/05/2024    4:04 PM 02/03/2024    4:06 PM  Depression screen PHQ 2/9  Decreased Interest 1 2 1   Down, Depressed, Hopeless 0 1 0  PHQ - 2 Score 1 3 1   Altered sleeping 2 3 0  Tired, decreased energy 0 3 0  Change in appetite 1 3 2   Feeling bad or failure about yourself  0 2 0  Trouble concentrating 2 1 2   Moving slowly or fidgety/restless 0 0 0  Suicidal thoughts 0 0 0  PHQ-9 Score 6 15 5    Difficult doing work/chores Not difficult at all Somewhat difficult Not difficult at all     Data saved with a previous flowsheet row definition      06/16/2024    1:55 PM 05/05/2024    4:04 PM 02/03/2024    4:07 PM 12/25/2023    9:29 AM  GAD 7 : Generalized Anxiety Score   Nervous, Anxious, on Edge 1 1  1   0   Control/stop worrying 0 1  1  0   Worry too much - different things 1 1  1   0   Trouble relaxing 0 1  1  0   Restless 0 1  0  0   Easily annoyed or irritable 1 1  1   0   Afraid - awful might happen 0 1  1  0   Total GAD 7 Score 3 7 6  0  Anxiety Difficulty Not difficult at all Somewhat difficult Not difficult at all Not difficult at all     Data saved with a previous flowsheet row definition    Medications: Show/hide medication list[1]  Review of Systems All negative Except see HPI   {Insert previous labs (optional):23779} {See past labs  Heme  Chem  Endocrine  Serology  Results Review (optional):1}   Objective    BP 116/66 (BP Location: Left Arm, Patient Position: Sitting, Cuff Size: Large)   Pulse (!) 59   Wt 293 lb (132.9 kg)   SpO2 100%   BMI 44.55 kg/m  {Insert last BP/Wt (optional):23777}{See vitals history (optional):1}   Physical Exam   No results found  for any visits on 06/16/24.      Assessment and Plan Assessment & Plan     No orders of the defined types were placed in this encounter.   No follow-ups on file.   The patient was advised to call back or seek an in-person evaluation if the symptoms worsen or if the condition fails to improve as anticipated.  I discussed the assessment and treatment plan with the patient. The patient was provided an opportunity to ask questions and all were answered. The patient agreed with the plan and demonstrated an understanding of the instructions.  I, Charlton Boule, PA-C have reviewed all documentation for this visit. The documentation on 06/16/2024  for the exam, diagnosis, procedures, and orders are all accurate and complete.  Jolynn Spencer, [facility], MMS [facility] [phone] (phone) [phone] (fax)  [facility]      [1] Outpatient Medications Prior to Visit  Medication Sig   albuterol  (VENTOLIN  HFA) 108 (90 Base) MCG/ACT  inhaler Inhale 2 puffs into the lungs every 6 (six) hours as needed for wheezing or shortness of breath.   aspirin  EC 81 MG tablet Take 81 mg by mouth daily. Swallow whole.   atorvastatin  (LIPITOR) 40 MG tablet Take 1 tablet (40 mg total) by mouth daily.   azithromycin  (ZITHROMAX ) 250 MG tablet Take 1 tablet (250 mg total) by mouth daily. Take first 2 tablets together, then 1 every day until finished.   carvedilol  (COREG ) 3.125 MG tablet Take 1 tablet (3.125 mg total) by mouth 2 (two) times daily with a meal.   clotrimazole -betamethasone  (LOTRISONE ) cream Apply 1 Application topically 2 (two) times daily. Apply twice daily to head of the penis   docusate sodium  (COLACE) 100 MG capsule Take 1 capsule (100 mg total) by mouth 2 (two) times daily. (Patient taking differently: Take 100 mg by mouth 2 (two) times daily as needed.)   fluticasone (FLONASE) 50 MCG/ACT nasal spray Place 1 spray into both nostrils daily.   gabapentin  (NEURONTIN ) 400 MG capsule Take 1 capsule by mouth 3 (three) times daily.   glimepiride  (AMARYL ) 4 MG tablet Take 2-4 mg by mouth See admin instructions. Take 4 mg by mouth in the morning and 2 mg at night   JARDIANCE 25 MG TABS tablet Take 25 mg by mouth every morning.   ketoconazole  (NIZORAL ) 2 % cream Apply 1 Application topically daily. To right foot   losartan  (COZAAR ) 100 MG tablet Take 1 tablet (100 mg total) by mouth daily.   metFORMIN (GLUCOPHAGE) 500 MG tablet Take 1,000 mg by mouth 2 (two) times daily.   NON FORMULARY Pt uses a cpap nightly   omeprazole  (PRILOSEC ) 40 MG capsule Take 1 capsule (40 mg total) by mouth daily.   Semaglutide , 2 MG/DOSE, 8 MG/3ML SOPN Inject 2 mg into the skin once a week.   Vibegron  (GEMTESA ) 75 MG TABS Take 1 tablet by mouth once daily   Vitamin D , Ergocalciferol , (DRISDOL ) 1.25 MG (50000 UNIT) CAPS capsule Take 1 capsule (50,000 Units total) by mouth every 7 (seven) days.   No facility-administered medications prior to  visit.  "

## 2024-06-17 ENCOUNTER — Ambulatory Visit: Payer: Self-pay | Admitting: Physician Assistant

## 2024-06-17 ENCOUNTER — Emergency Department

## 2024-06-17 ENCOUNTER — Other Ambulatory Visit: Payer: Self-pay

## 2024-06-17 ENCOUNTER — Emergency Department: Admission: EM | Admit: 2024-06-17 | Discharge: 2024-06-18 | Disposition: A | Discharge status: Home / Self Care

## 2024-06-17 DIAGNOSIS — R42 Dizziness and giddiness: Secondary | ICD-10-CM | POA: Insufficient documentation

## 2024-06-17 DIAGNOSIS — R0789 Other chest pain: Secondary | ICD-10-CM | POA: Insufficient documentation

## 2024-06-17 DIAGNOSIS — G4489 Other headache syndrome: Secondary | ICD-10-CM | POA: Insufficient documentation

## 2024-06-17 DIAGNOSIS — I509 Heart failure, unspecified: Secondary | ICD-10-CM | POA: Insufficient documentation

## 2024-06-17 DIAGNOSIS — I110 Hypertensive heart disease with heart failure: Secondary | ICD-10-CM | POA: Diagnosis not present

## 2024-06-17 DIAGNOSIS — E119 Type 2 diabetes mellitus without complications: Secondary | ICD-10-CM | POA: Insufficient documentation

## 2024-06-17 DIAGNOSIS — E1122 Type 2 diabetes mellitus with diabetic chronic kidney disease: Secondary | ICD-10-CM | POA: Insufficient documentation

## 2024-06-17 DIAGNOSIS — J329 Chronic sinusitis, unspecified: Secondary | ICD-10-CM | POA: Insufficient documentation

## 2024-06-17 DIAGNOSIS — J0181 Other acute recurrent sinusitis: Secondary | ICD-10-CM | POA: Insufficient documentation

## 2024-06-17 DIAGNOSIS — I252 Old myocardial infarction: Secondary | ICD-10-CM | POA: Insufficient documentation

## 2024-06-17 DIAGNOSIS — R079 Chest pain, unspecified: Secondary | ICD-10-CM

## 2024-06-17 LAB — COMPREHENSIVE METABOLIC PANEL WITH GFR
ALT: 48 [IU]/L — ABNORMAL HIGH (ref 0–44)
AST: 41 [IU]/L — ABNORMAL HIGH (ref 0–40)
Albumin: 4.3 g/dL (ref 3.8–4.9)
Alkaline Phosphatase: 127 [IU]/L — ABNORMAL HIGH (ref 47–123)
BUN/Creatinine Ratio: 13 (ref 10–24)
BUN: 22 mg/dL (ref 8–27)
Bilirubin Total: 0.3 mg/dL (ref 0.0–1.2)
CO2: 20 mmol/L (ref 20–29)
Calcium: 9.2 mg/dL (ref 8.6–10.2)
Chloride: 103 mmol/L (ref 96–106)
Creatinine, Ser: 1.75 mg/dL — ABNORMAL HIGH (ref 0.76–1.27)
Globulin, Total: 2.5 g/dL (ref 1.5–4.5)
Glucose: 84 mg/dL (ref 70–99)
Potassium: 4.6 mmol/L (ref 3.5–5.2)
Sodium: 139 mmol/L (ref 134–144)
Total Protein: 6.8 g/dL (ref 6.0–8.5)
eGFR: 44 mL/min/{1.73_m2} — ABNORMAL LOW

## 2024-06-17 LAB — BASIC METABOLIC PANEL WITH GFR
Anion gap: 14 (ref 5–15)
BUN: 22 mg/dL — ABNORMAL HIGH (ref 6–20)
CO2: 19 mmol/L — ABNORMAL LOW (ref 22–32)
Calcium: 9 mg/dL (ref 8.9–10.3)
Chloride: 101 mmol/L (ref 98–111)
Creatinine, Ser: 1.68 mg/dL — ABNORMAL HIGH (ref 0.61–1.24)
GFR, Estimated: 46 mL/min — ABNORMAL LOW
Glucose, Bld: 233 mg/dL — ABNORMAL HIGH (ref 70–99)
Potassium: 4.3 mmol/L (ref 3.5–5.1)
Sodium: 135 mmol/L (ref 135–145)

## 2024-06-17 LAB — CBC WITH DIFFERENTIAL/PLATELET
Basophils Absolute: 0.1 10*3/uL (ref 0.0–0.2)
Basos: 1 %
EOS (ABSOLUTE): 0.1 10*3/uL (ref 0.0–0.4)
Eos: 1 %
Hematocrit: 47.1 % (ref 37.5–51.0)
Hemoglobin: 16.3 g/dL (ref 13.0–17.7)
Immature Grans (Abs): 0 10*3/uL (ref 0.0–0.1)
Immature Granulocytes: 0 %
Lymphocytes Absolute: 1.4 10*3/uL (ref 0.7–3.1)
Lymphs: 19 %
MCH: 29.4 pg (ref 26.6–33.0)
MCHC: 34.6 g/dL (ref 31.5–35.7)
MCV: 85 fL (ref 79–97)
Monocytes Absolute: 0.6 10*3/uL (ref 0.1–0.9)
Monocytes: 8 %
Neutrophils Absolute: 5 10*3/uL (ref 1.4–7.0)
Neutrophils: 71 %
Platelets: 193 10*3/uL (ref 150–450)
RBC: 5.55 x10E6/uL (ref 4.14–5.80)
RDW: 14.6 % (ref 11.6–15.4)
WBC: 7.2 10*3/uL (ref 3.4–10.8)

## 2024-06-17 LAB — CBC
HCT: 48.8 % (ref 39.0–52.0)
Hemoglobin: 16.2 g/dL (ref 13.0–17.0)
MCH: 29.3 pg (ref 26.0–34.0)
MCHC: 33.2 g/dL (ref 30.0–36.0)
MCV: 88.4 fL (ref 80.0–100.0)
Platelets: 197 10*3/uL (ref 150–400)
RBC: 5.52 MIL/uL (ref 4.22–5.81)
RDW: 15 % (ref 11.5–15.5)
WBC: 7.6 10*3/uL (ref 4.0–10.5)
nRBC: 0 % (ref 0.0–0.2)

## 2024-06-17 LAB — PRO BRAIN NATRIURETIC PEPTIDE: Pro Brain Natriuretic Peptide: 57.3 pg/mL

## 2024-06-17 LAB — RESP PANEL BY RT-PCR (RSV, FLU A&B, COVID)  RVPGX2
Influenza A by PCR: NEGATIVE
Influenza B by PCR: NEGATIVE
Resp Syncytial Virus by PCR: NEGATIVE
SARS Coronavirus 2 by RT PCR: NEGATIVE

## 2024-06-17 LAB — TROPONIN T: Troponin T (Highly Sensitive): 32 ng/L (ref 0–22)

## 2024-06-17 LAB — PROTIME-INR
INR: 0.9 (ref 0.8–1.2)
Prothrombin Time: 12.9 s (ref 11.4–15.2)

## 2024-06-17 LAB — D-DIMER, QUANTITATIVE: D-DIMER: 0.93 mg{FEU}/L — ABNORMAL HIGH (ref 0.00–0.49)

## 2024-06-17 LAB — TROPONIN T, HIGH SENSITIVITY
Troponin T High Sensitivity: 33 ng/L — ABNORMAL HIGH (ref 0–19)
Troponin T High Sensitivity: 30 ng/L — ABNORMAL HIGH (ref 0–19)

## 2024-06-17 MED ORDER — IOHEXOL 350 MG/ML SOLN
100.0000 mL | Freq: Once | INTRAVENOUS | Status: AC | PRN
  Administered 2024-06-17: 100 mL via INTRAVENOUS

## 2024-06-17 MED ORDER — MECLIZINE HCL 25 MG PO TABS
25.0000 mg | ORAL_TABLET | Freq: Once | ORAL | Status: AC
  Administered 2024-06-17: 25 mg via ORAL
  Filled 2024-06-17: qty 1

## 2024-06-17 MED ORDER — DOXYCYCLINE HYCLATE 100 MG PO TABS: 100.0000 mg | ORAL_TABLET | Freq: Two times a day (BID) | ORAL | 0 refills | Status: AC

## 2024-06-17 NOTE — Progress Notes (Signed)
 Please, let pt know that his troponin level and d dimer level are high. He needs to proceed to ED

## 2024-06-17 NOTE — ED Provider Notes (Incomplete)
 "  [name]    Event Date/Time   First MD Initiated Contact with Patient 06/17/24 1801     (approximate)   History   Shortness of Breath  Pt to ED for shob for a few weeks with a twitch in chest every once in a while. Sent from doctor for elevated d dimer and trop 32. DOE noted.    HPI Justin Hammond is a 59 y.o. male PMH CHF, hypertension, BPH, prior NSTEMI, hyperlipidemia, diabetes presents for evaluation of dizziness, brief episode of chest discomfort - ***   Per chart review, patient was seen in family medicine clinic yesterday for dizziness.  Noted recent nasal congestion, sinus pressure, fatigue.  Also noted some shortness of breath.  Noted to have poorly controlled diabetes, most recent hemoglobin A1c 8.6.  Labs ordered, subsequently came back showing a high-sensitivity troponin at 32, D-dimer elevated at 0.93.  CBC, CMP were essentially at baseline.     Physical Exam   Triage Vital Signs: ED Triage Vitals  Encounter Vitals Group     BP 06/17/24 1753 137/71     Girls Systolic BP Percentile --      Girls Diastolic BP Percentile --      Boys Systolic BP Percentile --      Boys Diastolic BP Percentile --      Pulse Rate 06/17/24 1753 74     Resp 06/17/24 1753 (!) 22     Temp 06/17/24 1753 98.5 F (36.9 C)     Temp src --      SpO2 06/17/24 1753 96 %     Weight 06/17/24 1754 291 lb 0.1 oz (132 kg)     Height 06/17/24 1754 5' 8 (1.727 m)     Head Circumference --      Peak Flow --      Pain Score 06/17/24 1754 0     Pain Loc --      Pain Education --      Exclude from Growth Chart --     Most recent vital signs: Vitals:   06/17/24 1753  BP: 137/71  Pulse: 74  Resp: (!) 22  Temp: 98.5 F (36.9 C)  SpO2: 96%    {Only need to document appropriate and relevant physical exam:1} General: Awake, no distress. *** CV:  Good peripheral perfusion. RRR, RP 2+*** Resp:  Normal effort. CTAB*** Abd:  No distention.  Nontender to deep palpation throughout*** Other:  ***   ED Results / Procedures / Treatments   Labs (all labs ordered are listed, but only abnormal results are displayed) Labs Reviewed  BASIC METABOLIC PANEL WITH GFR  CBC  TROPONIN T, HIGH SENSITIVITY     EKG  ***   RADIOLOGY *** {USE THE WORD INTERPRETED!! You MUST document your own interpretation of imaging, as well as the fact that you reviewed the radiologist's report!:1}   PROCEDURES:  Critical Care performed: {CriticalCareYesNo:19197::Yes, see critical care procedure note(s),No}  Procedures   MEDICATIONS ORDERED IN ED: Medications - No data to display   IMPRESSION / MDM / ASSESSMENT AND PLAN / ED COURSE  I reviewed the triage vital signs and the nursing notes.                              DDX/MDM/AP: Differential diagnosis includes, but is not limited to, ***  Plan: - ***  Patient's presentation is most consistent with {EM COPA:27473}  ***  The patient is on the cardiac monitor to evaluate for evidence of arrhythmia and/or significant heart rate changes.***  ED course below. ***  Clinical Course as of 06/17/24 2345  Wed Jun 17, 2024  1854 BMP with mildly low bicarb, creatinine within baseline range  CBC unremarkable  BNP normal  Troponin mildly elevated [MM]  1854 CXR: IMPRESSION: 1. No acute cardiopulmonary pathology.   [MM]  2025 CTH: IMPRESSION: 1. No acute intracranial abnormality. 2. Left-sided sinusitis.   [MM]  2025 CTPE: IMPRESSION: 1. No evidence of pulmonary embolism. 2. Cholelithiasis.   [MM]  2109 Rpt trop stable [MM]  2338 Patient reevaluated, reassured by unremarkable workup.  Discussed ongoing sinusitis, appears did recently completed course of azithromycin .  Does have a penicillin allergy. [MM]    Clinical Course User Index [MM] Clarine Ozell LABOR, MD     FINAL CLINICAL IMPRESSION(S) / ED DIAGNOSES   Final diagnoses:  None     Rx / DC Orders   ED  Discharge Orders     None        Note:  This document was prepared using Dragon voice recognition software and may include unintentional dictation errors. "

## 2024-06-17 NOTE — Discharge Instructions (Addendum)
 Your evaluation in the emergency department was notable for ongoing sinusitis, I suspect this may be contributing to your ongoing episodes of dizziness.  I have prescribed you a new antibiotic to treat this -- please take the full course as prescribed.  We saw no blood clot in your lungs, and your heart testing was overall reassuring.  Please do follow-up with your primary care provider and cardiologist for ongoing evaluation, and return to the emergency department with any new or worsening symptoms.

## 2024-06-17 NOTE — ED Triage Notes (Signed)
 Pt to ED for shob for a few weeks with a twitch in chest every once in a while. Sent from doctor for elevated d dimer and trop 32. DOE noted.

## 2024-06-17 NOTE — ED Provider Notes (Signed)
 "  [facility] Provider Note    Event Date/Time   First MD Initiated Contact with Patient 06/17/24 1801     (approximate)   History   Shortness of Breath  Pt to ED for shob for a few weeks with a twitch in chest every once in a while. Sent from doctor for elevated d dimer and trop 32. DOE noted.    HPI GABRIEN Hammond is a 59 y.o. male PMH CHF, hypertension, BPH, prior NSTEMI, hyperlipidemia, diabetes presents for evaluation of dizziness, brief episode of chest discomfort - Patient has been having some episodes of dizziness over the past few days.  Was seen in clinic for this yesterday, did get some screening cardiac testing, was told to come to emergency department today after test resulted, see summary below. - Currently asymptomatic.  Does endorse having had a brief twinge of chest discomfort few days ago, thought been having any recent chest pain -Does note a lot of recent congestion and sinus discharge as well as mild headache.  No fevers. -Some mild dyspnea on exertion, no significant leg swelling from baseline - Recently completed course of azithromycin    Per chart review, patient was seen in family medicine clinic yesterday for dizziness.  Noted recent nasal congestion, sinus pressure, fatigue.  Also noted some shortness of breath.  Noted to have poorly controlled diabetes, most recent hemoglobin A1c 8.6.  Labs ordered, subsequently came back showing a high-sensitivity troponin at 32, D-dimer elevated at 0.93.  CBC, CMP were essentially at baseline.     Physical Exam   Triage Vital Signs: ED Triage Vitals  Encounter Vitals Group     BP 06/17/24 1753 137/71     Girls Systolic BP Percentile --      Girls Diastolic BP Percentile --      Boys Systolic BP Percentile --      Boys Diastolic BP Percentile --      Pulse Rate 06/17/24 1753 74     Resp 06/17/24 1753 (!) 22     Temp 06/17/24 1753 98.5 F (36.9 C)     Temp src --      SpO2 06/17/24  1753 96 %     Weight 06/17/24 1754 291 lb 0.1 oz (132 kg)     Height 06/17/24 1754 5' 8 (1.727 m)     Head Circumference --      Peak Flow --      Pain Score 06/17/24 1754 0     Pain Loc --      Pain Education --      Exclude from Growth Chart --     Most recent vital signs: Vitals:   06/17/24 2300 06/17/24 2330  BP: (!) 107/43 (!) 102/54  Pulse: 61 (!) 57  Resp:    Temp:  98 F (36.7 C)  SpO2: 98% 96%     General: Awake, no distress.  CV:  Good peripheral perfusion. RRR, RP 2+ Resp:  Normal effort. CTAB Abd:  No distention. Nontender to deep palpation throughout Neuro:  Aox4, CN II-XII intact, FNF wnl, finger taps fast b/l, 5/5 strength in bilateral finger extension/grip, arm flexion/extension, EHL/FHL. BUE AG 10+ sec no drift, BLE AG 5+ sec no drift. Ambulates with steady gait. SILT.     ED Results / Procedures / Treatments   Labs (all labs ordered are listed, but only abnormal results are displayed) Labs Reviewed  BASIC METABOLIC PANEL WITH GFR - Abnormal; Notable for the following components:  Result Value   CO2 19 (*)    Glucose, Bld 233 (*)    BUN 22 (*)    Creatinine, Ser 1.68 (*)    GFR, Estimated 46 (*)    All other components within normal limits  TROPONIN T, HIGH SENSITIVITY - Abnormal; Notable for the following components:   Troponin T High Sensitivity 33 (*)    All other components within normal limits  TROPONIN T, HIGH SENSITIVITY - Abnormal; Notable for the following components:   Troponin T High Sensitivity 30 (*)    All other components within normal limits  RESP PANEL BY RT-PCR (RSV, FLU A&B, COVID)  RVPGX2  CBC  PRO BRAIN NATRIURETIC PEPTIDE  PROTIME-INR     EKG  Ecg = sinus rhythm, rate 70, no gross ST elevation or depression, no significant repolarization abnormality, left axis deviation, normal intervals except for first-degree AV block.  No clear evidence of ischemia no arrhythmia my interpretation.   RADIOLOGY Radiology  interpreted by myself radiology reports reviewed.  No acute pathology identified except for sinusitis.    PROCEDURES:  Critical Care performed: No  Procedures   MEDICATIONS ORDERED IN ED: Medications  meclizine  (ANTIVERT ) tablet 25 mg (25 mg Oral Given 06/17/24 1955)  iohexol  (OMNIPAQUE ) 350 MG/ML injection 100 mL (100 mLs Intravenous Contrast Given 06/17/24 1913)     IMPRESSION / MDM / ASSESSMENT AND PLAN / ED COURSE  I reviewed the triage vital signs and the nursing notes.                              DDX/MDM/AP: Differential diagnosis includes, but is not limited to, clinical concern for sinusitis which may be contributing to episodes of dizziness.  Consider subacute CVA.  Did have elevated D-dimer as an outpatient, will screen for PE with CTA chest.  Considered but doubt anginal equivalent based on history and exam.  Plan: - Labs - Chest x-ray - EKG - Meclizine  - CT head, CTA chest  Patient's presentation is most consistent with acute presentation with potential threat to life or bodily function.  The patient is on the cardiac monitor to evaluate for evidence of arrhythmia and/or significant heart rate changes.  ED course below.  Workup overall reassuring.  Troponin stable from yesterday and again stable on repeat.  No evidence of hypervolemia/CHF exacerbation.  Viral swab negative.  No PE.  CT head does show sinusitis, no evidence of CVA and I do not clinically suspect occult CVA.  Highly doubt cardiac etiology of presentation today, does appear patient had a normal myocardial perfusion scan about a year and a half ago which is also quite reassuring.  In shared decision making, patient is comfortable with discharge home and close outpatient follow-up with his primary care provider and cardiologist.  Given his penicillin allergy I will start him on doxycycline  for ongoing sinusitis which I believe is the likely etiology of his presentation.  ED return precautions in place.   Patient agrees with plan.  Clinical Course as of 06/18/24 0044  Wed Jun 17, 2024  1854 BMP with mildly low bicarb, creatinine within baseline range  CBC unremarkable  BNP normal  Troponin mildly elevated [MM]  1854 CXR: IMPRESSION: 1. No acute cardiopulmonary pathology.   [MM]  2025 CTH: IMPRESSION: 1. No acute intracranial abnormality. 2. Left-sided sinusitis.   [MM]  2025 CTPE: IMPRESSION: 1. No evidence of pulmonary embolism. 2. Cholelithiasis.   [MM]  2109 Rpt trop  stable [MM]  2338 Patient reevaluated, reassured by unremarkable workup.  Discussed ongoing sinusitis, appears did recently completed course of azithromycin .  Does have a penicillin allergy. [MM]    Clinical Course User Index [MM] Clarine Ozell LABOR, MD     FINAL CLINICAL IMPRESSION(S) / ED DIAGNOSES   Final diagnoses:  Sinusitis, unspecified chronicity, unspecified location  Dizziness  Nonspecific chest pain     Rx / DC Orders   ED Discharge Orders          Ordered    doxycycline  (VIBRA -TABS) 100 MG tablet  2 times daily        06/17/24 2346             Note:  This document was prepared using Dragon voice recognition software and may include unintentional dictation errors.   Clarine Ozell LABOR, MD 06/18/24 [phone]  "

## 2024-06-19 ENCOUNTER — Ambulatory Visit

## 2024-06-19 VITALS — BP 126/73 | HR 57 | Temp 98.4°F | Wt 293.3 lb

## 2024-06-19 DIAGNOSIS — I2510 Atherosclerotic heart disease of native coronary artery without angina pectoris: Secondary | ICD-10-CM

## 2024-06-19 DIAGNOSIS — J019 Acute sinusitis, unspecified: Secondary | ICD-10-CM | POA: Diagnosis not present

## 2024-06-19 NOTE — Progress Notes (Signed)
 "    Established patient visit   Patient: Justin Hammond   DOB: [birth year]   59 y.o. Male  MRN: [id number] Visit Date: 06/19/2024  Today's healthcare provider: Isaiah DELENA Pepper, MD   Chief Complaint  Patient presents with   Hospitalization Follow-up    Still stopped up  Not as dizzy   Subjective    HPI  Discussed the use of AI scribe software for clinical note transcription with the patient, who gave verbal consent to proceed.  History of Present Illness Justin Hammond is a 59 year old male with coronary artery disease who presents with persistent sinus infection symptoms.  He has ongoing symptoms of a sinus infection, with three doses of doxycycline  taken so far. He feels it might have helped slightly but still does not feel well. He uses over-the-counter cough syrup and nasal spray two to three times daily. He experiences hoarseness, some shortness of breath, and occasional ear pain. No current chest congestion, though it was present earlier.  He reports that his dizziness has improved.  He is currently taking doxycycline  for his sinus infection and has been prescribed Atarax , which he took once but is unsure if he should continue. He also mentions taking an allergy pill, possibly Zyrtec, but is uncertain about the details. He reports dry mouth, which he associates with his medication use.  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 He is concerned about driving while feeling unwell.     Medications: Show/hide medication list[1]  Review of Systems as noted in HPI.      Objective    BP 126/73   Pulse (!) 57   Temp 98.4 F (36.9 C) (Oral)   Wt 293 lb 4.8 oz (133 kg)   SpO2 97%   BMI 44.60 kg/m    Physical Exam Constitutional:      Appearance: Normal appearance.  HENT:     Head: Normocephalic and atraumatic.     Right Ear: Tympanic membrane, ear canal and external ear normal.     Left Ear: Tympanic  membrane, ear canal and external ear normal.     Nose: Congestion present.     Mouth/Throat:     Mouth: Mucous membranes are moist.  Eyes:     Pupils: Pupils are equal, round, and reactive to light.  Pulmonary:     Effort: Pulmonary effort is normal.     Breath sounds: Normal breath sounds.  Skin:    General: Skin is warm.  Neurological:     General: No focal deficit present.     Mental Status: He is alert.      No results found for any visits on 06/19/24.  Assessment & Plan     Problem List Items Addressed This Visit       Cardiovascular and Mediastinum   Coronary artery disease involving native coronary artery of native heart   Other Visit Diagnoses       Acute sinusitis, recurrence not specified, unspecified location    -  Primary      Assessment & Plan Acute sinusitis Patient recently went to the ED and was found to have sinusitis. Patient has taken doxycycline  for 1 day with some improvement. Dizziness has improved.  - Continue doxycycline  as prescribed. - Use nasal spray as needed for congestion. - Consider Mucinex  for mucus clearance. - Follow up if symptoms do not improve by next Friday.  Coronary artery disease  Previous stent placement. Recent elevated troponin levels noted in the emergency room, but levels trended down. Last cardiology follow-up was in November, with next appointment scheduled for May. - Follow up with cardiologist if dizziness worsens or chest pain returns   I personally spent a total of 20 minutes in the care of the patient today including preparing to see the patient, getting/reviewing separately obtained history, performing a medically appropriate exam/evaluation, and counseling and educating.  No follow-ups on file.       Isaiah DELENA Pepper, MD  [facility] [phone] (phone) [phone] (fax)     [1]  Outpatient Medications Prior to Visit  Medication Sig   albuterol  (VENTOLIN  HFA) 108 (90 Base)  MCG/ACT inhaler Inhale 2 puffs into the lungs every 6 (six) hours as needed for wheezing or shortness of breath.   aspirin  EC 81 MG tablet Take 81 mg by mouth daily. Swallow whole.   atorvastatin  (LIPITOR) 40 MG tablet Take 1 tablet (40 mg total) by mouth daily.   carvedilol  (COREG ) 3.125 MG tablet Take 1 tablet (3.125 mg total) by mouth 2 (two) times daily with a meal.   clotrimazole -betamethasone  (LOTRISONE ) cream Apply 1 Application topically 2 (two) times daily. Apply twice daily to head of the penis   docusate sodium  (COLACE) 100 MG capsule Take 1 capsule (100 mg total) by mouth 2 (two) times daily.   doxycycline  (VIBRA -TABS) 100 MG tablet Take 1 tablet (100 mg total) by mouth 2 (two) times daily for 7 days.   fluticasone (FLONASE) 50 MCG/ACT nasal spray Place 1 spray into both nostrils daily.   gabapentin  (NEURONTIN ) 400 MG capsule Take 1 capsule by mouth 3 (three) times daily.   glimepiride  (AMARYL ) 4 MG tablet Take 2-4 mg by mouth See admin instructions. Take 4 mg by mouth in the morning and 2 mg at night   JARDIANCE 25 MG TABS tablet Take 25 mg by mouth every morning.   losartan  (COZAAR ) 100 MG tablet Take 1 tablet (100 mg total) by mouth daily.   metFORMIN (GLUCOPHAGE) 500 MG tablet Take 1,000 mg by mouth 2 (two) times daily.   omeprazole  (PRILOSEC ) 40 MG capsule Take 1 capsule (40 mg total) by mouth daily.   Semaglutide , 2 MG/DOSE, 8 MG/3ML SOPN Inject 2 mg into the skin once a week.   Vibegron  (GEMTESA ) 75 MG TABS Take 1 tablet by mouth once daily   Vitamin D , Ergocalciferol , (DRISDOL ) 1.25 MG (50000 UNIT) CAPS capsule Take 1 capsule (50,000 Units total) by mouth every 7 (seven) days.   [DISCONTINUED] hydrOXYzine  (ATARAX ) 10 MG tablet Take 1 tablet (10 mg total) by mouth 3 (three) times daily as needed.   ketoconazole  (NIZORAL ) 2 % cream Apply 1 Application topically daily. To right foot   NON FORMULARY Pt uses a cpap nightly   [DISCONTINUED] azithromycin  (ZITHROMAX ) 250 MG tablet  Take 1 tablet (250 mg total) by mouth daily. Take first 2 tablets together, then 1 every day until finished. (Patient not taking: Reported on 06/19/2024)   No facility-administered medications prior to visit.   "

## 2024-06-22 ENCOUNTER — Other Ambulatory Visit: Payer: Self-pay

## 2024-06-22 DIAGNOSIS — E875 Hyperkalemia: Secondary | ICD-10-CM | POA: Insufficient documentation

## 2024-06-22 DIAGNOSIS — I129 Hypertensive chronic kidney disease with stage 1 through stage 4 chronic kidney disease, or unspecified chronic kidney disease: Secondary | ICD-10-CM | POA: Insufficient documentation

## 2024-06-22 DIAGNOSIS — E1122 Type 2 diabetes mellitus with diabetic chronic kidney disease: Secondary | ICD-10-CM | POA: Insufficient documentation

## 2024-06-23 ENCOUNTER — Telehealth: Payer: Self-pay | Admitting: Family Medicine

## 2024-06-23 ENCOUNTER — Ambulatory Visit: Admitting: Family Medicine

## 2024-06-23 ENCOUNTER — Encounter: Payer: Self-pay | Admitting: Family Medicine

## 2024-06-23 ENCOUNTER — Other Ambulatory Visit: Payer: Self-pay

## 2024-06-23 VITALS — BP 103/65 | HR 62 | Temp 98.1°F | Ht 68.0 in | Wt 289.0 lb

## 2024-06-23 DIAGNOSIS — K219 Gastro-esophageal reflux disease without esophagitis: Secondary | ICD-10-CM

## 2024-06-23 DIAGNOSIS — R1310 Dysphagia, unspecified: Secondary | ICD-10-CM | POA: Diagnosis not present

## 2024-06-23 DIAGNOSIS — R195 Other fecal abnormalities: Secondary | ICD-10-CM

## 2024-06-23 DIAGNOSIS — K5900 Constipation, unspecified: Secondary | ICD-10-CM | POA: Diagnosis not present

## 2024-06-23 DIAGNOSIS — R1312 Dysphagia, oropharyngeal phase: Secondary | ICD-10-CM

## 2024-06-23 DIAGNOSIS — K5909 Other constipation: Secondary | ICD-10-CM

## 2024-06-23 NOTE — Telephone Encounter (Signed)
 [facility] faxed refill request for the following medications:  gabapentin  (NEURONTIN ) 400 MG capsule     Please advise.

## 2024-06-23 NOTE — Telephone Encounter (Signed)
 LOV 06/19/24  NOV 08/03/24 LRF 03/15/24 270 Dr Honor Cork

## 2024-06-23 NOTE — Telephone Encounter (Signed)
 Please send to PCP

## 2024-06-23 NOTE — Telephone Encounter (Signed)
 Converted

## 2024-06-24 NOTE — Progress Notes (Signed)
 PT was informed about his results and was seen at ED on 06/17/24

## 2024-06-25 ENCOUNTER — Other Ambulatory Visit: Payer: Self-pay | Admitting: Cardiology

## 2024-06-25 DIAGNOSIS — I2510 Atherosclerotic heart disease of native coronary artery without angina pectoris: Secondary | ICD-10-CM

## 2024-06-25 DIAGNOSIS — Z955 Presence of coronary angioplasty implant and graft: Secondary | ICD-10-CM

## 2024-06-25 DIAGNOSIS — R0609 Other forms of dyspnea: Secondary | ICD-10-CM

## 2024-06-25 DIAGNOSIS — I10 Essential (primary) hypertension: Secondary | ICD-10-CM

## 2024-06-25 DIAGNOSIS — R0789 Other chest pain: Secondary | ICD-10-CM

## 2024-06-26 ENCOUNTER — Ambulatory Visit

## 2024-06-26 ENCOUNTER — Ambulatory Visit: Admission: RE | Admit: 2024-06-26 | Discharge: 2024-06-26 | Disposition: A | Source: Home / Self Care

## 2024-06-26 ENCOUNTER — Other Ambulatory Visit: Payer: Self-pay

## 2024-06-26 ENCOUNTER — Encounter: Admission: RE | Disposition: A | Payer: Self-pay | Source: Home / Self Care

## 2024-06-26 DIAGNOSIS — K219 Gastro-esophageal reflux disease without esophagitis: Secondary | ICD-10-CM

## 2024-06-26 LAB — GLUCOSE, CAPILLARY: Glucose-Capillary: 171 mg/dL — ABNORMAL HIGH (ref 70–99)

## 2024-06-26 MED ORDER — PROPOFOL 500 MG/50ML IV EMUL
INTRAVENOUS | Status: DC | PRN
  Administered 2024-06-26: 200 ug/kg/min via INTRAVENOUS
  Administered 2024-06-26 (×2): 20 mg via INTRAVENOUS

## 2024-06-26 MED ORDER — SODIUM CHLORIDE 0.9 % IV SOLN: INTRAVENOUS | Status: DC

## 2024-06-26 MED ORDER — DEXMEDETOMIDINE HCL IN NACL 80 MCG/20ML IV SOLN
INTRAVENOUS | Status: DC | PRN
  Administered 2024-06-26: 12 ug via INTRAVENOUS

## 2024-06-26 MED ORDER — LIDOCAINE HCL (PF) 2 % IJ SOLN
INTRAMUSCULAR | Status: DC | PRN
  Administered 2024-06-26: 100 mg via INTRADERMAL

## 2024-06-26 NOTE — Anesthesia Preprocedure Evaluation (Addendum)
 "                                  Anesthesia Evaluation  Patient identified by MRN, date of birth, ID band Patient awake    Reviewed: Allergy & Precautions, NPO status , Patient's Chart, lab work & pertinent test results  Airway Mallampati: III  TM Distance: >3 FB Neck ROM: full    Dental  (+) Teeth Intact   Pulmonary neg pulmonary ROS, sleep apnea    Pulmonary exam normal  + decreased breath sounds      Cardiovascular hypertension, Pt. on medications + CAD, + Past MI, + Cardiac Stents and +CHF  Normal cardiovascular exam Rhythm:Regular Rate:Normal     Neuro/Psych  Headaches negative neurological ROS  negative psych ROS   GI/Hepatic negative GI ROS, Neg liver ROS,GERD  Medicated,,  Endo/Other  diabetes, Type 2, Oral Hypoglycemic Agents  Class 4 obesity  Renal/GU CRFRenal disease  negative genitourinary   Musculoskeletal  (+) Arthritis ,    Abdominal  (+) + obese  Peds negative pediatric ROS (+)  Hematology negative hematology ROS (+)   Anesthesia Other Findings Past Medical History: No date: Arthritis 06/22/2024: Benign hypertensive kidney disease with chronic kidney  disease 2007: Bladder cancer (HCC) No date: BPH (benign prostatic hyperplasia) No date: CHF (congestive heart failure) (HCC) No date: Cholelithiasis 12/28/2016: Coronary artery disease     Comment:  a.) LHC/PCI 12/28/2016: 100% mRCA with faint collaterals              from LEFT, 99% o-pLAD (4.50 x 15 mm Resolute Onyx DES);               b.) MV 12/29/2019: no ischemia; c.) MV 01/22/2023: no               ischemia No date: Essential hypertension No date: GERD (gastroesophageal reflux disease) 05/27/2019: History of 2019 novel coronavirus disease (COVID-19) 12/24/2019: History of echocardiogram     Comment:  a.) TTE 12/24/2019: EF 60-65%, no RWMAs, normal RVSF, no              valvulopathies 06/22/2024: Hyperkalemia No date: Hyperlipidemia 06/05/2021: Impingement syndrome of  right shoulder region No date: Long-term use of aspirin  therapy No date: Morbid obesity with BMI of 40.0-44.9, adult (HCC) No date: Neuropathy of both feet 12/27/2016: NSTEMI (non-ST elevated myocardial infarction) [facility])     Comment:  a.) troponins trended: 0.21 --> 0.13 --> 0.21 --> 0.21               ng/mL; b.) LHC 12/28/2016: 99% o-pLAD (4.5 x 15 mm               Resolute Onyx DES) No date: OSA on CPAP No date: Proteinuria 01/2023: Renal cell carcinoma, left (HCC)     Comment:  a.) s/p LEFT nephrectomy, adrenalectomy, and               retroperitoneal lymph node dissection 01/2023 No date: Sleep apnea 01/15/2024: Stage 3b chronic kidney disease (HCC) 05/18/2023: Trigger thumb of right hand 06/22/2024: Type 2 diabetes mellitus with diabetic chronic kidney  disease (HCC) No date: Type 2 diabetes mellitus with hyperlipidemia (HCC) 12/31/2023: 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, left hand  Past Surgical History: 1978: APPENDECTOMY 12/28/2016: CORONARY STENT INTERVENTION; N/A     Comment:  Procedure: CORONARY STENT INTERVENTION;  Surgeon: Bosie,  Vinie LABOR, MD;  Location: [facility] INVASIVE CV LAB;  Service:              Cardiovascular;  Laterality: N/A; 12/28/2016: CORONARY STENT INTERVENTION; N/A     Comment:  Procedure: CORONARY STENT INTERVENTION;  Surgeon:               Ammon Blunt, MD;  Location: [facility] INVASIVE CV               LAB;  Service: Cardiovascular;  Laterality: N/A; 03/29/2023: HOLEP-LASER ENUCLEATION OF THE PROSTATE WITH  MORCELLATION; N/A     Comment:  Procedure: HOLEP-LASER ENUCLEATION OF THE PROSTATE WITH               MORCELLATION;  Surgeon: Francisca Redell BROCKS, MD;  Location:               [facility] ORS;  Service: Urology;  Laterality: N/A; 12/28/2016: LEFT HEART CATH AND CORONARY ANGIOGRAPHY; N/A     Comment:  Procedure: LEFT HEART CATH AND CORONARY ANGIOGRAPHY;                Surgeon: Bosie Vinie LABOR, MD;  Location:  [facility] INVASIVE CV              LAB;  Service: Cardiovascular;  Laterality: N/A; No date: PROSTATE SURGERY 01/30/2023: ROBOT ASSISTED LAPAROSCOPIC NEPHRECTOMY; Left     Comment:  Procedure: XI ROBOTIC ASSISTED LEFT LAPAROSCOPIC RADICAL              NEPHRECTOMY AND RETROPERITONEAL NODE DISSECTION;                Surgeon: Alvaro Ricardo KATHEE Mickey., MD;  Location: [facility] ORS;                Service: Urology;  Laterality: Left;  180 MINUTES No date: TRANSURETHRAL MICROWAVE THERAPY     Comment:  2009, 2018  BMI    Body Mass Index: 44.22 kg/m      Reproductive/Obstetrics negative OB ROS                              Anesthesia Physical Anesthesia Plan  ASA: 3  Anesthesia Plan: General   Post-op Pain Management:    Induction: Intravenous  PONV Risk Score and Plan: Propofol  infusion and TIVA  Airway Management Planned: Natural Airway and Nasal Cannula  Additional Equipment:   Intra-op Plan:   Post-operative Plan:   Informed Consent: I have reviewed the patients History and Physical, chart, labs and discussed the procedure including the risks, benefits and alternatives for the proposed anesthesia with the patient or authorized representative who has indicated his/her understanding and acceptance.     Dental Advisory Given  Plan Discussed with: CRNA  Anesthesia Plan Comments:          Anesthesia Quick Evaluation  "

## 2024-06-26 NOTE — Anesthesia Postprocedure Evaluation (Signed)
"   Anesthesia Post Note  Patient: Justin Hammond  Procedure(s) Performed: EGD (ESOPHAGOGASTRODUODENOSCOPY) EGD, WITH BALLOON DILATION BIOPSY, GI TRACT  Patient location during evaluation: PACU Anesthesia Type: General Level of consciousness: awake Pain management: pain level controlled Vital Signs Assessment: post-procedure vital signs reviewed and stable Respiratory status: spontaneous breathing Cardiovascular status: stable Anesthetic complications: no   There were no known notable events for this encounter.   Last Vitals:  Vitals:   06/26/24 0848 06/26/24 0858  BP: (!) 92/57 100/89  Pulse: 71 64  Resp: 18 17  Temp:    SpO2: 95% 96%    Last Pain:  Vitals:   06/26/24 0858  TempSrc:   PainSc: 0-No pain                 VAN STAVEREN,[address]      "

## 2024-06-26 NOTE — Transfer of Care (Signed)
 Immediate Anesthesia Transfer of Care Note  Patient: Justin Hammond  Procedure(s) Performed: EGD (ESOPHAGOGASTRODUODENOSCOPY) EGD, WITH BALLOON DILATION BIOPSY, GI TRACT  Patient Location: Endoscopy Unit  Anesthesia Type:General  Level of Consciousness: awake  Airway & Oxygen Therapy: Patient Spontanous Breathing  Post-op Assessment: Report given to RN and Post -op Vital signs reviewed and stable  Post vital signs: Reviewed and stable  Last Vitals:  Vitals Value Taken Time  BP 92/57 06/26/24 08:48  Temp    Pulse 67 06/26/24 08:48  Resp 19 06/26/24 08:48  SpO2 94 % 06/26/24 08:48  Vitals shown include unfiled device data.  Last Pain:  Vitals:   06/26/24 0848  TempSrc:   PainSc: Asleep         Complications: There were no known notable events for this encounter.

## 2024-06-26 NOTE — H&P (Signed)
 "  Aloysius JAYSON Nap, MD [facility] [address] [place], [place] [address] [phone] Fax : [phone]  Primary Care Physician:  Donzella Lauraine SAILOR, DO Primary Gastroenterologist:  Dr. Nap  Pre-Procedure History & Physical: HPI:  Justin Hammond is a 59 y.o. male is here for an endoscopy.   Past Medical History:  Diagnosis Date   Arthritis    Benign hypertensive kidney disease with chronic kidney disease 06/22/2024   Bladder cancer (HCC) 2007   BPH (benign prostatic hyperplasia)    CHF (congestive heart failure) (HCC)    Cholelithiasis    Coronary artery disease 12/28/2016   a.) LHC/PCI 12/28/2016: 100% mRCA with faint collaterals from LEFT, 99% o-pLAD (4.50 x 15 mm Resolute Onyx DES); b.) MV 12/29/2019: no ischemia; c.) MV 01/22/2023: no ischemia   Essential hypertension    GERD (gastroesophageal reflux disease)    History of 2019 novel coronavirus disease (COVID-19) 05/27/2019   History of echocardiogram 12/24/2019   a.) TTE 12/24/2019: EF 60-65%, no RWMAs, normal RVSF, no valvulopathies   Hyperkalemia 06/22/2024   Hyperlipidemia    Impingement syndrome of right shoulder region 06/05/2021   Long-term use of aspirin  therapy    Morbid obesity with BMI of 40.0-44.9, adult (HCC)    Neuropathy of both feet    NSTEMI (non-ST elevated myocardial infarction) (HCC) 12/27/2016   a.) troponins trended: 0.21 --> 0.13 --> 0.21 --> 0.21 ng/mL; b.) LHC 12/28/2016: 99% o-pLAD (4.5 x 15 mm Resolute Onyx DES)   OSA on CPAP    Proteinuria    Renal cell carcinoma, left (HCC) 01/2023   a.) s/p LEFT nephrectomy, adrenalectomy, and retroperitoneal lymph node dissection 01/2023   Sleep apnea    Stage 3b chronic kidney disease (HCC) 01/15/2024   Trigger thumb of right hand 05/18/2023   Type 2 diabetes mellitus with diabetic chronic kidney disease (HCC) 06/22/2024   Type 2 diabetes mellitus with hyperlipidemia (HCC)    Unilateral primary osteoarthritis of first carpometacarpal  joint, left hand 12/31/2023    Past Surgical History:  Procedure Laterality Date   APPENDECTOMY  1978   CORONARY STENT INTERVENTION N/A 12/28/2016   Procedure: CORONARY STENT INTERVENTION;  Surgeon: Bosie Vinie LABOR, MD;  Location: [facility] INVASIVE CV [facility];  Service: Cardiovascular;  Laterality: N/A;   CORONARY STENT INTERVENTION N/A 12/28/2016   Procedure: CORONARY STENT INTERVENTION;  Surgeon: Ammon Blunt, MD;  Location: [facility] INV[facility] CV [facility];  Service: Cardiovascular;  Laterality: N/A;   HOLEP-LASER ENUCLEATION OF THE PROSTATE WITH MORCELLATION N/A 03/29/2023   Procedure: HOLEP-LASER ENUCLEATION OF THE PROSTATE WITH MORCELLATION;  Surgeon: Francisca Redell JAYSON, MD;  Location: [facility] ORS;  Service: Urology;  Laterality: N/A;   LEFT HEART CATH AND CORONARY ANGIOGRAPHY N/A 12/28/2016   Procedure: LEFT HEART CATH AND CORONARY ANGIOGRAPHY;  Surgeon: Bosie Vinie LABOR, MD;  Location: [facility] INV[facility]IVE CV [facility];  Service: Cardiovascular;  Laterality: N/A;   PROSTATE SURGERY     ROBOT ASSISTED LAPAROSCOPIC NEPHRECTOMY Left 01/30/2023   Procedure: XI ROBOTIC ASSISTED LEFT LAPAROSCOPIC RADICAL NEPHRECTOMY AND RETROPERITONEAL NODE DISSECTION;  Surgeon: Alvaro Ricardo KATHEE Mickey., MD;  Location: [facility] OR[facility];  Service: Urology;  Laterality: Left;  180 MINUTES   TRANSURETHRAL MICROWAVE THERAPY     2009, 2018    Prior to Admission medications  Medication Sig Start Date End Date Taking? Authorizing Provider  albuterol  (VENTOLIN  HFA) 108 (90 Base) MCG/ACT inhaler Inhale 2 puffs into the lungs every 6 (six) hours as needed for wheezing or shortness of breath. 12/25/23  Donzella Lauraine SAILOR, DO  aspirin  EC 81 MG tablet Take 81 mg by mouth daily. Swallow whole.    [provider]  atorvastatin  (LIPITOR) 40 MG tablet Take 1 tablet (40 mg total) by mouth daily. 12/29/16   Vachhani, Vaibhavkumar, MD  carvedilol  (COREG ) 3.125 MG tablet Take 1 tablet (3.125 mg total) by mouth 2 (two) times daily with a meal. 12/29/16   Vachhani,  Vaibhavkumar, MD  clotrimazole -betamethasone  (LOTRISONE ) cream Apply 1 Application topically 2 (two) times daily. Apply twice daily to head of the penis 03/29/23   Francisca Redell BROCKS, MD  docusate sodium  (COLACE) 100 MG capsule Take 1 capsule (100 mg total) by mouth 2 (two) times daily. 01/30/23   Dancy, Alan, PA-C  Finerenone (KERENDIA) 10 MG TABS Take 1 tablet by mouth daily. 02/15/24   [provider]  fluticasone (FLONASE) 50 MCG/ACT nasal spray Place 1 spray into both nostrils daily. 06/10/24   [provider]  gabapentin  (NEURONTIN ) 400 MG capsule Take 1 capsule by mouth 3 (three) times daily. 10/21/16   [provider]  glimepiride  (AMARYL ) 4 MG tablet Take 2-4 mg by mouth See admin instructions. Take 4 mg by mouth in the morning and 2 mg at night 01/16/23   [provider]  JARDIANCE 25 MG TABS tablet Take 25 mg by mouth every morning. 10/05/19   [provider]  ketoconazole  (NIZORAL ) 2 % cream Apply 1 Application topically daily. To right foot 02/03/24   Donzella Lauraine SAILOR, DO  losartan  (COZAAR ) 100 MG tablet Take 1 tablet (100 mg total) by mouth daily. 12/16/23   Donzella Lauraine SAILOR, DO  metFORMIN (GLUCOPHAGE) 500 MG tablet Take 1,000 mg by mouth 2 (two) times daily. 12/19/22   [provider]  NON FORMULARY Pt uses a cpap nightly    [provider]  omeprazole  (PRILOSEC ) 40 MG capsule Take 1 capsule (40 mg total) by mouth daily. 12/16/23   Donzella Lauraine SAILOR, DO  Semaglutide , 2 MG/DOSE, 8 MG/3ML SOPN Inject 2 mg into the skin once a week. 12/16/23   Donzella Lauraine SAILOR, DO  Vibegron  (GEMTESA ) 75 MG TABS Take 1 tablet by mouth once daily 03/16/24   Vaillancourt, Samantha, PA-C  Vitamin D , Ergocalciferol , (DRISDOL ) 1.25 MG (50000 UNIT) CAPS capsule Take 1 capsule (50,000 Units total) by mouth every 7 (seven) days. 05/18/24   Donzella Lauraine SAILOR, DO    Allergies as of 06/23/2024 - Review Complete 06/23/2024  Allergen Reaction Noted   Penicillins Itching,  Dermatitis, and Hives 11/05/2015    Family History  Problem Relation Age of Onset   Breast cancer Mother    CAD Father     Social History   Socioeconomic History   Marital status: Single    Spouse name: Not on file   Number of children: Not on file   Years of education: Not on file   Highest education level: Not on file  Occupational History   Not on file  Tobacco Use   Smoking status: Never   Smokeless tobacco: Never  Vaping Use   Vaping status: Never Used  Substance and Sexual Activity   Alcohol use: No   Drug use: Never   Sexual activity: Not Currently  Other Topics Concern   Not on file  Social History Narrative   Works for [employer]; no smoking; no alcohol. [place]; with self; no children.    Social Drivers of Health   Tobacco Use: Low Risk (06/26/2024)   Patient History  Smoking Tobacco Use: Never    Smokeless Tobacco Use: Never    Passive Exposure: Not on file  Financial Resource Strain: Not on file  Food Insecurity: No Food Insecurity (01/30/2023)   Hunger Vital Sign    Worried About Running Out of Food in the Last Year: Never true    Ran Out of Food in the Last Year: Never true  Recent Concern: Food Insecurity - Food Insecurity Present (01/30/2023)   Hunger Vital Sign    Worried About Running Out of Food in the Last Year: Never true    Ran Out of Food in the Last Year: Sometimes true  Transportation Needs: No Transportation Needs (03/06/2024)   Epic    Lack of Transportation (Medical): No    Lack of Transportation (Non-Medical): No  Physical Activity: Not on file  Stress: Not on file  Social Connections: Not on file  Intimate Partner Violence: Not At Risk (01/30/2023)   Humiliation, Afraid, Rape, and Kick questionnaire    Fear of Current or Ex-Partner: No    Emotionally Abused: No    Physically Abused: No    Sexually Abused: No  Depression (PHQ2-9): Medium Risk (06/19/2024)   Depression (PHQ2-9)    PHQ-2 Score: 7  Alcohol  Screen: Low Risk (03/06/2024)   Alcohol Screen    Last Alcohol Screening Score (AUDIT): 0  Housing: Patient Declined (01/30/2023)   Housing    Last Housing Risk Score: 0  Utilities: Not At Risk (01/30/2023)   AHC Utilities    Threatened with loss of utilities: No  Health Literacy: Adequate Health Literacy (03/06/2024)   B1300 Health Literacy    Frequency of need for help with medical instructions: Never    Review of Systems: See HPI, otherwise negative ROS  Physical Exam: There were no vitals taken for this visit. General:   Alert,  pleasant and cooperative in NAD Head:  Normocephalic and atraumatic. Neck:  Supple; no masses or thyromegaly. Lungs:  Clear throughout to auscultation.    Heart:  Regular rate and rhythm. Abdomen:  Soft, nontender and nondistended. Normal bowel sounds, without guarding, and without rebound.   Neurologic:  Alert and  oriented x4;  grossly normal neurologically.  Impression/Plan: Justin Hammond is here for an endoscopy to be performed for dysphagia  Risks, benefits, limitations, and alternatives regarding  endoscopy have been reviewed with the patient.  Questions have been answered.  All parties agreeable.   Aloysius JAYSON Nap, MD  06/26/2024, 7:58 AM "

## 2024-06-26 NOTE — Op Note (Signed)
 [facility] Gastroenterology Patient Name: Justin Hammond Procedure Date: 06/26/2024 7:57 AM MRN: [id number] Account #: [id number] Date of Birth: June 16, 1965 Admit Type: Outpatient Age: 59 Room: [facility] ENDO ROOM 4 Gender: Male Note Status: Finalized Instrument Name: Upper GI Scope [id number] Procedure:             Upper GI endoscopy Indications:           Suspected esophageal reflux Providers:             Aloysius Eric Nap, MD Referring MD:          Lauraine GEANNIE Buoy (Referring MD) Medicines:             Monitored Anesthesia Care Complications:         No immediate complications. Procedure:             Pre-Anesthesia Assessment:                        - Prior to the procedure, a History and Physical was                         performed, and patient medications and allergies were                         reviewed. The patient is competent. The risks and                         benefits of the procedure and the sedation options and                         risks were discussed with the patient. All questions                         were answered and informed consent was obtained.                         Patient identification and proposed procedure were                         verified by the physician. Mental Status Examination:                         alert and oriented. Airway Examination: normal                         oropharyngeal airway and neck mobility. CV                         Examination: normal. Prophylactic Antibiotics: The                         patient does not require prophylactic antibiotics.                         Prior Anticoagulants: The patient has taken no                         anticoagulant or antiplatelet agents. ASA Grade  Assessment: III - A patient with severe systemic                         disease. After reviewing the risks and benefits, the                         patient was deemed in satisfactory condition to                          undergo the procedure. The anesthesia plan was to use                         general anesthesia. Immediately prior to                         administration of medications, the patient was                         re-assessed for adequacy to receive sedatives. The                         heart rate, respiratory rate, oxygen saturations,                         blood pressure, adequacy of pulmonary ventilation, and                         response to care were monitored throughout the                         procedure. The physical status of the patient was                         re-assessed after the procedure.                        After obtaining informed consent, the endoscope was                         passed under direct vision. Throughout the procedure,                         the patient's blood pressure, pulse, and oxygen                         saturations were monitored continuously. The Endoscope                         was introduced through the mouth, and advanced to the                         second part of duodenum. The upper GI endoscopy was                         accomplished without difficulty. The patient tolerated                         the procedure well. Findings:      The examined  duodenum was normal.      Localized mildly erythematous mucosa without bleeding was found on the       anterior wall of the gastric antrum. Biopsies were taken with a cold       forceps for Helicobacter pylori testing. Verification of patient       identification for the specimen was done. Estimated blood loss was       minimal.      Localized mild inflammation characterized by congestion (edema) was       found in the gastric fundus. Biopsies were taken with a cold forceps for       histology. Verification of patient identification for the specimen was       done. Estimated blood loss was minimal.      No endoscopic abnormality was evident in the esophagus  to explain the       patient's complaint of dysphagia. It was decided, however, to proceed       with dilation of the lower third of the esophagus. A TTS dilator was       passed through the scope. Dilation with a 12-13.5-15 mm balloon dilator       was performed to 15 mm. The dilation site was examined and showed no       change. Impression:            - Normal examined duodenum.                        - Erythematous mucosa in the anterior wall of the                         gastric antrum. Biopsied.                        - Gastritis. Biopsied.                        - No endoscopic esophageal abnormality to explain                         patient's dysphagia. Esophagus dilated. Dilated. Recommendation:        - Discharge patient to home (with escort).                        - Resume previous diet.                        - Continue present medications.                        - Await pathology results. Procedure Code(s):     --- Professional ---                        [phone], Esophagogastroduodenoscopy, flexible,                         transoral; with transendoscopic balloon dilation of                         esophagus (less than 30 mm diameter) Diagnosis Code(s):     --- Professional ---  K31.89, Other diseases of stomach and duodenum                        K29.70, Gastritis, unspecified, without bleeding                        R13.10, Dysphagia, unspecified CPT copyright 2022 [employer]. All rights reserved. The codes documented in this report are preliminary and upon coder review may  be revised to meet current compliance requirements. Aloysius Eric Nap, MD 06/26/2024 8:40:36 AM This report has been signed electronically. Number of Addenda: 0 Note Initiated On: 06/26/2024 7:57 AM Estimated Blood Loss:  Estimated blood loss: none.      [facility]

## 2024-06-29 ENCOUNTER — Ambulatory Visit

## 2024-07-09 ENCOUNTER — Ambulatory Visit

## 2024-07-21 ENCOUNTER — Ambulatory Visit

## 2024-08-03 ENCOUNTER — Ambulatory Visit: Admitting: Family Medicine

## 2024-10-23 ENCOUNTER — Ambulatory Visit: Admitting: Physician Assistant

## 2024-11-04 ENCOUNTER — Encounter
